# Patient Record
Sex: Female | Born: 1945 | Race: White | Hispanic: No | Marital: Married | Smoking: Former smoker
Health system: Southern US, Community
[De-identification: ages and names within clinical notes are randomized; demographics above are authoritative.]

## PROBLEM LIST (undated history)

## (undated) DIAGNOSIS — I4891 Unspecified atrial fibrillation: Secondary | ICD-10-CM

## (undated) DIAGNOSIS — I499 Cardiac arrhythmia, unspecified: Secondary | ICD-10-CM

## (undated) DIAGNOSIS — E559 Vitamin D deficiency, unspecified: Secondary | ICD-10-CM

## (undated) DIAGNOSIS — K08109 Complete loss of teeth, unspecified cause, unspecified class: Secondary | ICD-10-CM

## (undated) DIAGNOSIS — F419 Anxiety disorder, unspecified: Secondary | ICD-10-CM

## (undated) DIAGNOSIS — I480 Paroxysmal atrial fibrillation: Secondary | ICD-10-CM

## (undated) DIAGNOSIS — E1140 Type 2 diabetes mellitus with diabetic neuropathy, unspecified: Secondary | ICD-10-CM

## (undated) DIAGNOSIS — E119 Type 2 diabetes mellitus without complications: Secondary | ICD-10-CM

## (undated) DIAGNOSIS — N2581 Secondary hyperparathyroidism of renal origin: Secondary | ICD-10-CM

## (undated) DIAGNOSIS — Z7901 Long term (current) use of anticoagulants: Secondary | ICD-10-CM

## (undated) DIAGNOSIS — R112 Nausea with vomiting, unspecified: Secondary | ICD-10-CM

## (undated) DIAGNOSIS — G9332 Myalgic encephalomyelitis/chronic fatigue syndrome: Secondary | ICD-10-CM

## (undated) DIAGNOSIS — Z860100 Personal history of colon polyps, unspecified: Secondary | ICD-10-CM

## (undated) DIAGNOSIS — M810 Age-related osteoporosis without current pathological fracture: Secondary | ICD-10-CM

## (undated) DIAGNOSIS — R2681 Unsteadiness on feet: Secondary | ICD-10-CM

## (undated) DIAGNOSIS — K222 Esophageal obstruction: Secondary | ICD-10-CM

## (undated) DIAGNOSIS — M5030 Other cervical disc degeneration, unspecified cervical region: Secondary | ICD-10-CM

## (undated) DIAGNOSIS — G4733 Obstructive sleep apnea (adult) (pediatric): Secondary | ICD-10-CM

## (undated) DIAGNOSIS — L719 Rosacea, unspecified: Secondary | ICD-10-CM

## (undated) DIAGNOSIS — T4145XA Adverse effect of unspecified anesthetic, initial encounter: Secondary | ICD-10-CM

## (undated) DIAGNOSIS — M797 Fibromyalgia: Secondary | ICD-10-CM

## (undated) DIAGNOSIS — K219 Gastro-esophageal reflux disease without esophagitis: Secondary | ICD-10-CM

## (undated) DIAGNOSIS — E785 Hyperlipidemia, unspecified: Secondary | ICD-10-CM

## (undated) DIAGNOSIS — Z972 Presence of dental prosthetic device (complete) (partial): Secondary | ICD-10-CM

## (undated) DIAGNOSIS — F909 Attention-deficit hyperactivity disorder, unspecified type: Secondary | ICD-10-CM

## (undated) DIAGNOSIS — F32A Depression, unspecified: Secondary | ICD-10-CM

## (undated) DIAGNOSIS — N183 Chronic kidney disease, stage 3 unspecified: Secondary | ICD-10-CM

## (undated) DIAGNOSIS — I10 Essential (primary) hypertension: Secondary | ICD-10-CM

## (undated) DIAGNOSIS — K5909 Other constipation: Secondary | ICD-10-CM

## (undated) DIAGNOSIS — I341 Nonrheumatic mitral (valve) prolapse: Secondary | ICD-10-CM

## (undated) DIAGNOSIS — C4490 Unspecified malignant neoplasm of skin, unspecified: Secondary | ICD-10-CM

## (undated) DIAGNOSIS — H269 Unspecified cataract: Secondary | ICD-10-CM

## (undated) DIAGNOSIS — M1990 Unspecified osteoarthritis, unspecified site: Secondary | ICD-10-CM

## (undated) DIAGNOSIS — Z794 Long term (current) use of insulin: Secondary | ICD-10-CM

## (undated) DIAGNOSIS — Z9889 Other specified postprocedural states: Secondary | ICD-10-CM

## (undated) HISTORY — DX: Hyperlipidemia, unspecified: E78.5

## (undated) HISTORY — PX: CATARACT EXTRACTION W/ INTRAOCULAR LENS IMPLANT: SHX1309

## (undated) HISTORY — DX: Essential (primary) hypertension: I10

## (undated) HISTORY — DX: Unspecified cataract: H26.9

## (undated) HISTORY — PX: TUBAL LIGATION: SHX77

## (undated) HISTORY — PX: ABDOMINAL HYSTERECTOMY: SHX81

## (undated) HISTORY — PX: BREAST EXCISIONAL BIOPSY: SUR124

## (undated) HISTORY — DX: Nonrheumatic mitral (valve) prolapse: I34.1

## (undated) HISTORY — DX: Obstructive sleep apnea (adult) (pediatric): G47.33

---

## 1973-04-24 DIAGNOSIS — T4145XA Adverse effect of unspecified anesthetic, initial encounter: Secondary | ICD-10-CM

## 1973-04-24 DIAGNOSIS — T8859XA Other complications of anesthesia, initial encounter: Secondary | ICD-10-CM

## 1973-04-24 HISTORY — DX: Other complications of anesthesia, initial encounter: T88.59XA

## 1973-04-24 HISTORY — DX: Adverse effect of unspecified anesthetic, initial encounter: T41.45XA

## 1979-04-25 HISTORY — PX: THYROIDECTOMY: SHX17

## 1981-04-24 HISTORY — PX: TUBAL LIGATION: SHX77

## 1985-04-24 HISTORY — PX: THYROIDECTOMY: SHX17

## 1987-04-25 HISTORY — PX: CARDIAC CATHETERIZATION: SHX172

## 1993-04-24 HISTORY — PX: TOTAL ABDOMINAL HYSTERECTOMY W/ BILATERAL SALPINGOOPHORECTOMY: SHX83

## 1998-11-15 ENCOUNTER — Encounter: Payer: Self-pay | Admitting: Emergency Medicine

## 1998-11-15 ENCOUNTER — Encounter: Payer: Self-pay | Admitting: Internal Medicine

## 1998-11-15 ENCOUNTER — Observation Stay (HOSPITAL_COMMUNITY): Admission: EM | Admit: 1998-11-15 | Discharge: 1998-11-15 | Payer: Self-pay | Admitting: Emergency Medicine

## 1998-11-30 ENCOUNTER — Ambulatory Visit (HOSPITAL_COMMUNITY): Admission: RE | Admit: 1998-11-30 | Discharge: 1998-11-30 | Payer: Self-pay | Admitting: Gastroenterology

## 1999-03-12 ENCOUNTER — Ambulatory Visit (HOSPITAL_COMMUNITY): Admission: RE | Admit: 1999-03-12 | Discharge: 1999-03-12 | Payer: Self-pay | Admitting: *Deleted

## 1999-12-09 ENCOUNTER — Encounter: Admission: RE | Admit: 1999-12-09 | Discharge: 1999-12-09 | Payer: Self-pay | Admitting: Geriatric Medicine

## 1999-12-09 ENCOUNTER — Encounter: Payer: Self-pay | Admitting: Geriatric Medicine

## 2000-02-14 ENCOUNTER — Encounter: Admission: RE | Admit: 2000-02-14 | Discharge: 2000-02-14 | Payer: Self-pay | Admitting: Geriatric Medicine

## 2000-02-14 ENCOUNTER — Encounter: Payer: Self-pay | Admitting: Geriatric Medicine

## 2000-12-14 ENCOUNTER — Other Ambulatory Visit: Admission: RE | Admit: 2000-12-14 | Discharge: 2000-12-14 | Payer: Self-pay | Admitting: Geriatric Medicine

## 2000-12-18 ENCOUNTER — Encounter: Admission: RE | Admit: 2000-12-18 | Discharge: 2000-12-18 | Payer: Self-pay | Admitting: Geriatric Medicine

## 2000-12-18 ENCOUNTER — Encounter: Payer: Self-pay | Admitting: Geriatric Medicine

## 2001-03-03 ENCOUNTER — Ambulatory Visit (HOSPITAL_COMMUNITY): Admission: RE | Admit: 2001-03-03 | Discharge: 2001-03-03 | Payer: Self-pay | Admitting: Neurological Surgery

## 2001-03-03 ENCOUNTER — Encounter: Payer: Self-pay | Admitting: Neurological Surgery

## 2002-07-04 ENCOUNTER — Encounter: Payer: Self-pay | Admitting: Geriatric Medicine

## 2002-07-04 ENCOUNTER — Encounter: Admission: RE | Admit: 2002-07-04 | Discharge: 2002-07-04 | Payer: Self-pay | Admitting: Geriatric Medicine

## 2002-12-15 ENCOUNTER — Other Ambulatory Visit: Admission: RE | Admit: 2002-12-15 | Discharge: 2002-12-15 | Payer: Self-pay | Admitting: Obstetrics and Gynecology

## 2003-09-25 ENCOUNTER — Encounter: Admission: RE | Admit: 2003-09-25 | Discharge: 2003-09-25 | Payer: Self-pay | Admitting: Geriatric Medicine

## 2004-04-05 ENCOUNTER — Encounter: Admission: RE | Admit: 2004-04-05 | Discharge: 2004-04-05 | Payer: Self-pay | Admitting: Geriatric Medicine

## 2004-08-12 ENCOUNTER — Ambulatory Visit: Payer: Self-pay | Admitting: Pulmonary Disease

## 2004-12-09 ENCOUNTER — Encounter: Admission: RE | Admit: 2004-12-09 | Discharge: 2004-12-09 | Payer: Self-pay | Admitting: Geriatric Medicine

## 2005-03-01 ENCOUNTER — Other Ambulatory Visit: Admission: RE | Admit: 2005-03-01 | Discharge: 2005-03-01 | Payer: Self-pay | Admitting: Geriatric Medicine

## 2005-03-22 ENCOUNTER — Encounter: Admission: RE | Admit: 2005-03-22 | Discharge: 2005-03-22 | Payer: Self-pay | Admitting: Geriatric Medicine

## 2005-10-20 ENCOUNTER — Encounter: Admission: RE | Admit: 2005-10-20 | Discharge: 2005-10-20 | Payer: Self-pay | Admitting: Geriatric Medicine

## 2005-12-20 ENCOUNTER — Encounter: Admission: RE | Admit: 2005-12-20 | Discharge: 2005-12-20 | Payer: Self-pay | Admitting: Gastroenterology

## 2006-01-09 ENCOUNTER — Encounter: Admission: RE | Admit: 2006-01-09 | Discharge: 2006-01-09 | Payer: Self-pay | Admitting: Geriatric Medicine

## 2006-06-05 ENCOUNTER — Ambulatory Visit (HOSPITAL_BASED_OUTPATIENT_CLINIC_OR_DEPARTMENT_OTHER): Admission: RE | Admit: 2006-06-05 | Discharge: 2006-06-05 | Payer: Self-pay | Admitting: Pulmonary Disease

## 2006-06-05 ENCOUNTER — Encounter: Payer: Self-pay | Admitting: Pulmonary Disease

## 2006-06-23 ENCOUNTER — Ambulatory Visit: Payer: Self-pay | Admitting: Pulmonary Disease

## 2006-08-30 ENCOUNTER — Ambulatory Visit: Payer: Self-pay | Admitting: Pulmonary Disease

## 2006-10-17 ENCOUNTER — Encounter: Admission: RE | Admit: 2006-10-17 | Discharge: 2006-10-17 | Payer: Self-pay | Admitting: Geriatric Medicine

## 2006-11-19 ENCOUNTER — Ambulatory Visit: Payer: Self-pay | Admitting: Pulmonary Disease

## 2007-07-10 DIAGNOSIS — E782 Mixed hyperlipidemia: Secondary | ICD-10-CM | POA: Insufficient documentation

## 2007-07-10 DIAGNOSIS — I059 Rheumatic mitral valve disease, unspecified: Secondary | ICD-10-CM | POA: Insufficient documentation

## 2007-07-10 DIAGNOSIS — I10 Essential (primary) hypertension: Secondary | ICD-10-CM | POA: Insufficient documentation

## 2007-07-10 DIAGNOSIS — R5382 Chronic fatigue, unspecified: Secondary | ICD-10-CM | POA: Insufficient documentation

## 2007-07-10 DIAGNOSIS — G4733 Obstructive sleep apnea (adult) (pediatric): Secondary | ICD-10-CM | POA: Insufficient documentation

## 2008-06-22 ENCOUNTER — Encounter: Admission: RE | Admit: 2008-06-22 | Discharge: 2008-06-22 | Payer: Self-pay | Admitting: Geriatric Medicine

## 2008-06-22 ENCOUNTER — Ambulatory Visit: Payer: Self-pay | Admitting: Pulmonary Disease

## 2008-08-27 ENCOUNTER — Ambulatory Visit: Payer: Self-pay | Admitting: Pulmonary Disease

## 2008-10-15 ENCOUNTER — Encounter: Payer: Self-pay | Admitting: Pulmonary Disease

## 2009-07-07 ENCOUNTER — Encounter: Admission: RE | Admit: 2009-07-07 | Discharge: 2009-07-07 | Payer: Self-pay | Admitting: Orthopedic Surgery

## 2010-05-13 ENCOUNTER — Encounter
Admission: RE | Admit: 2010-05-13 | Discharge: 2010-05-13 | Payer: Self-pay | Source: Home / Self Care | Attending: Geriatric Medicine | Admitting: Geriatric Medicine

## 2010-05-14 ENCOUNTER — Encounter: Payer: Self-pay | Admitting: Geriatric Medicine

## 2010-05-15 ENCOUNTER — Encounter: Payer: Self-pay | Admitting: Geriatric Medicine

## 2010-05-15 ENCOUNTER — Encounter: Payer: Self-pay | Admitting: Orthopedic Surgery

## 2010-05-16 ENCOUNTER — Encounter: Payer: Self-pay | Admitting: Geriatric Medicine

## 2010-05-26 NOTE — Miscellaneous (Signed)
Summary: optimal pressure is 11cm  Clinical Lists Changes  Orders: Added new Referral order of DME Referral (DME) - Signed auto shows good compliance, optimal pressure 11cm

## 2010-05-26 NOTE — Procedures (Signed)
Summary: [facility]   Imported By: Sherian Rein 06/30/2008 09:13:10  _____________________________________________________________________  External Attachment:    Type:   Image     Comment:   External Document

## 2010-05-26 NOTE — Assessment & Plan Note (Signed)
Summary: rov for osa   PCP:  [facility]  Chief Complaint:  Pt is here for a f/u appt. Pt would like to discuss starting cpap machine.  Pt was last seen July 2008. [name]  History of Present Illness: the pt comes in today for evaluation of osa.  She was last seen in 2008, where she was diagnosed with moderate osa.  Despite my recommendation to start cpap, the pt decided that she didn't want to proceed with treatment.  She was lost to follow up.  She comes in today where she is continuing to have nonrestorative sleep, and significant daytime sleepiness. She is still taking many sedating meds, as well as stimulant medication during the day.    Prior Medications Reviewed Using: Patient Recall  Updated Prior Medication List: NEXIUM 40 MG  CPDR (ESOMEPRAZOLE MAGNESIUM) once daily LIPITOR 80 MG  TABS (ATORVASTATIN CALCIUM) once daily CYMBALTA 60 MG  CPEP (DULOXETINE HCL) every morning CYMBALTA 30 MG  CPEP (DULOXETINE HCL) at bedtime METFORMIN HCL 500 MG  TABS (METFORMIN HCL) 2 two times a day ATENOLOL 25 MG TABS (ATENOLOL) Take 1 tablet by mouth once a day PREMARIN 0.9 MG  TABS (ESTROGENS CONJUGATED) at bedtime NITROQUICK 0.4 MG  SUBL (NITROGLYCERIN) as needed OXYCONTIN 40 MG  TB12 (OXYCODONE HCL) take 1 tab by mouth every 12 hours for pain ADDERALL 20 MG  TABS (AMPHETAMINE-DEXTROAMPHETAMINE) 2 two times a day LORAZEPAM 1 MG  TABS (LORAZEPAM) as needed AMARYL 4 MG TABS (GLIMEPIRIDE) Take 1 tablet by mouth two times a day LYRICA 150 MG  CAPS (PREGABALIN) take 3 tabs by mouth at bedtime FLEXERIL 10 MG  TABS (CYCLOBENZAPRINE HCL) as needed OXYCONTIN 10 MG XR12H-TAB (OXYCODONE HCL) take 1/2 tab by mouth as needed for breakthrough pain  Current Allergies: ! PCN  Review of Systems       The patient complains of difficulty falling asleep, difficulty staying asleep, waking up choking, frequent night time awakenings, prolonged middle of the night awakening, excessive daytime sleepiness, loud snoring,  witnessed apneas, difficulty waking, and non restorative sleep.    Vital Signs:  Patient Profile:   65 Years Old Female Weight:      231.25 pounds O2 Sat:      94 % O2 treatment:    Room Air Temp:     98.1 degrees F oral Pulse rate:   111 / minute BP sitting:   120 / 78  (right arm) Cuff size:   large  Vitals Entered By: Arman Filter LPN (June 22, 4096 3:10 PM)             Comments Medications reviewed with patient Arman Filter LPN  June 22, 1189 3:10 PM     Physical Exam  General:     obese female in nad   Impression & Recommendations:  Problem # 1:  OBSTRUCTIVE SLEEP APNEA (ICD-327.23) the pt has known moderate osa, and is continuing to have significant symptoms.  She is willing at this point to try cpap therapy.  I also encouraged her to work on weight loss.  I have also mentioned that her daytime sleepiness may continue to be an issue as long as she is on multiple sedating meds.  Medications Added to Medication List This Visit: 1)  Atenolol 25 Mg Tabs (Atenolol) .... Take 1 tablet by mouth once a day 2)  Oxycontin 40 Mg Tb12 (Oxycodone hcl) .... Take 1 tab by mouth every 12 hours for pain 3)  Amaryl 4 Mg Tabs (  Glimepiride) .... Take 1 tablet by mouth two times a day 4)  Lyrica 150 Mg Caps (Pregabalin) .... Take 3 tabs by mouth at bedtime 5)  Oxycontin 10 Mg Xr12h-tab (Oxycodone hcl) .... Take 1/2 tab by mouth as needed for breakthrough pain  Patient Instructions: 1)  will start cpap  2)  work on weight loss 3)  f/u with me in 4 wks.

## 2010-05-26 NOTE — Assessment & Plan Note (Signed)
Summary: rov for osa/cpap trial   Primary Provider/Referring Provider:  [facility]  CC:  Pt is here for a f/u appt since starting CPAP.  Pt states she is using cpap machine every night. Approx 4 1/2 hrs per night.  Pt c/o condensation building up in the tubes, head and nasal congestion making it difficult to wear mask, and and difficulty with mask fitting properly.  Pt also wonders if pressure needs to be adjusted. [name]  History of Present Illness: The pt comes in today for f/u of her osa.  She has been wearing cpap for the past 3-4 weeks, and thinks the pressure is too low.  She also has been having issues getting the humidity adjusted, but thinks it is slowly gettting  better.  Her mask fits fairly well, with no significant leaking.  On the nights that she has worn it all night, she thinks her sleep was improved.  Current Medications (verified): 1)  Nexium 40 Mg  Cpdr (Esomeprazole Magnesium) .... Once Daily 2)  Lipitor 80 Mg  Tabs (Atorvastatin Calcium) .... Once Daily 3)  Cymbalta 60 Mg  Cpep (Duloxetine Hcl) .... Every Morning 4)  Cymbalta 30 Mg  Cpep (Duloxetine Hcl) .... At Bedtime 5)  Metformin Hcl 500 Mg  Tabs (Metformin Hcl) .... 2 Two Times A Day 6)  Premarin 0.9 Mg  Tabs (Estrogens Conjugated) .... At Bedtime 7)  Nitroquick 0.4 Mg  Subl (Nitroglycerin) .... As Needed 8)  Oxycontin 40 Mg  Tb12 (Oxycodone Hcl) .... Take 1 Tab By Mouth Every 12 Hours For Pain 9)  Adderall 20 Mg  Tabs (Amphetamine-Dextroamphetamine) .... 2 Two Times A Day 10)  Lorazepam 1 Mg  Tabs (Lorazepam) .... As Needed 11)  Amaryl 4 Mg Tabs (Glimepiride) .... Take 1 Tablet By Mouth Two Times A Day 12)  Lyrica 150 Mg  Caps (Pregabalin) .... Take 3 Tabs By Mouth At Bedtime 13)  Flexeril 10 Mg  Tabs (Cyclobenzaprine Hcl) .... As Needed 14)  Oxycontin 10 Mg Xr12h-Tab (Oxycodone Hcl) .... Take 1/2 Tab By Mouth As Needed For Breakthrough Pain 15)  Levaquin 500 Mg Tabs (Levofloxacin) .... Take 1 Tablet By Mouth Once A Day  X 5 Days  Allergies (verified): 1)  ! Pcn  Review of Systems      See HPI  Vital Signs:  Patient profile:   65 year old female Weight:      233.38 pounds O2 Sat:      94 % Temp:     97.7 degrees F oral Pulse rate:   120 / minute BP sitting:   112 / 76  (right arm) Cuff size:   large  Vitals Entered By: Arman Filter LPN (Aug 27, 1608 1:47 PM)  O2 Sat on room air at rest %:  94 CC: Pt is here for a f/u appt since starting CPAP.  Pt states she is using cpap machine every night. Approx 4 1/2 hrs per night.  Pt c/o condensation building up in the tubes, head and nasal congestion making it difficult to wear mask, and difficulty with mask fitting properly.  Pt also wonders if pressure needs to be adjusted.  Comments Medications reviewed with patient Arman Filter LPN  Aug 28, 9602 1:47 PM    Physical Exam  General:  ow female in nad Nose:  no skin breakdown or pressure necrosis from cpap mask.   Impression & Recommendations:  Problem # 1:  OBSTRUCTIVE SLEEP APNEA (ICD-327.23) it seems the patient has gotten  adjusted to CPAP, but more than likely her current pressure is suboptimal. At this point, I think we need to optimize her pressure for her with an autotitrating mode, and then see if she does better.  I will call her when the results are available.  I have also encouraged her to work hard on weight loss.  Medications Added to Medication List This Visit: 1)  Levaquin 500 Mg Tabs (Levofloxacin) .... Take 1 tablet by mouth once a day x 5 days  Other Orders: Est. Patient Level III (16109) DME Referral (DME)  Patient Instructions: 1)  will optimize your pressure for you with the auto setting.  Will let you know results and set up follow up apptm after that.  2)  continue to work on weight loss.

## 2010-05-31 ENCOUNTER — Ambulatory Visit: Payer: Self-pay | Admitting: Pulmonary Disease

## 2010-06-14 ENCOUNTER — Ambulatory Visit: Payer: Self-pay | Admitting: Pulmonary Disease

## 2010-08-12 ENCOUNTER — Other Ambulatory Visit: Payer: Self-pay | Admitting: Geriatric Medicine

## 2010-08-23 ENCOUNTER — Other Ambulatory Visit: Payer: Self-pay

## 2010-08-30 ENCOUNTER — Other Ambulatory Visit: Payer: Self-pay | Admitting: *Deleted

## 2010-08-30 ENCOUNTER — Other Ambulatory Visit (HOSPITAL_COMMUNITY)
Admission: RE | Admit: 2010-08-30 | Discharge: 2010-08-30 | Disposition: A | Discharge status: Home / Self Care | Payer: Medicare Other | Source: Ambulatory Visit | Attending: Geriatric Medicine | Admitting: Geriatric Medicine

## 2010-08-30 DIAGNOSIS — Z124 Encounter for screening for malignant neoplasm of cervix: Secondary | ICD-10-CM | POA: Insufficient documentation

## 2010-09-06 NOTE — Assessment & Plan Note (Signed)
[facility]                             PULMONARY OFFICE NOTE   [name], [name]                      MRN:          [id number]  DATE:08/30/2006                            DOB:          02/01/1946    HISTORY OF PRESENT ILLNESS:  The patient is a 65 year old white female  who comes in today for evaluation of obstructive sleep apnea.  I  initially saw the patient back in 2006 and scheduled her for nocturnal  polysomnography.  The patient cancelled this study, never rescheduled,  and subsequently never followed up with me.  I received a phone call  from her on April 30, 2006, where she wanted to go ahead and reschedule  the sleep study, since she continued to have difficulty with her sleep.  She could not make the study until February 2008 and now comes in for  followup.  The patient was found to have moderate obstructive sleep  apnea with 23 obstructive events noted in the first 68 minutes of sleep.  Unfortunately, this was her total sleep time.  She never achieved REM or  slow-wave sleep because of very prolonged sleep onset.  It should be  noted that she did take OxyContin as well as Adderall at bedtime.  The  patient was also found to have O2 desaturations as well in the high 70s  at times.  The patient has been told that she has snoring, but no one  has ever mentioned pauses in her breathing during sleep.  Her husband  wears CPAP himself.  She typically gets to bed between 12:00 and 1:00 at  night and gets up between 9:00 and 10:00 to start her day.  She is not  rested upon arising.  The patient states that multiple times during the  day she is overcome by a need to lie down and take a nap.  She notes  significant sleepiness with reading or trying to watch TV.   PAST MEDICAL HISTORY:  1. Hypertension.  2. History of mitral valve prolapse.  3. Diabetes.  4. Dyslipidemia.  5. Status post hysterectomy and tubal ligation.  6. History of chronic  fatigue syndrome and fibromyalgia.   MEDICATIONS:  1. Verelan 200 mg nightly.  2. Nexium 40 mg b.i.d.  3. Lipitor 80 mg daily.  4. Neurontin 300 mg one in the p.m. and two at h.s.  5. Cymbalta 60 mg q.a.m. and 30 mg nightly.  6. Metformin 500 mg two b.i.d.  7. Imdur 60 mg one-half daily.  8. Nitroglycerin p.r.n.  9. Atenolol 50 mg nightly.  10.Premarin 0.9 mg nightly.  11.Oxycodone CR 40 mg one q.8 h.  12.Adderall 20 mg two b.i.d.  13.Lorazepam 1 mg p.r.n.  14.Amaryl 2 mg daily.  15.Flexeril 10 mg p.r.n.   ALLERGIES:  The patient has an allergy to PENICILLIN.   SOCIAL HISTORY:  She is married.  She has never smoked.   FAMILY HISTORY:  Remarkable for father having heart disease as well as  cancer, otherwise is noncontributory in first-degree relatives.   REVIEW OF SYSTEMS:  As per history of  present illness; also see patient  intake form documented in the chart.   PHYSICAL EXAMINATION:  GENERAL:  She is an obese white female in no  acute distress.  Blood pressure is 114/72, pulse 81 and temperature is 98.2.  Weight is  235 pounds.  O2 saturation on room air is 95%.  HEENT:  Pupils are equal, round and reactive to light and accommodation.  Extraocular muscles are intact.  Nares show mild septal deviation to the  left with narrowing.  Oropharynx shows significant narrowing posteriorly  with elongation of soft palate, but a normal uvula.  NECK:  Supple without JVD or lymphadenopathy.  There is no palpable  thyromegaly.  CHEST:  Totally clear.  CARDIAC:  Regular rate and rhythm.  No murmurs, rubs or gallops.  ABDOMEN:  Soft and nontender with good bowel sounds.  GENITAL:  Not done and not indicated.  RECTAL:  Not done and not indicated.  BREASTS:  Not done and not indicated.  LOWER EXTREMITIES:  Show trace edema.  Pulses are intact distally.  NEUROLOGICAL:  Alert and oriented with no obvious motor deficits.   IMPRESSION:  1. Moderate obstructive sleep apnea with  significant oxygen      desaturation by nocturnal polysomnography.  The patient is      significantly obese and has abnormal upper airway anatomy.  I      really think a trial of continuous positive airway pressure and      weight loss are her best options.  I told the patient that her      sleep study represented the very best case scenario, since she only      had 68 minutes of total sleep time and had significant events      during that time.  If she had slept longer and gotten into slow-      wave sleep or rapid eye movement, I suspect her study would have      been much worse.  2. Sleep onset issues that seem to be variable by the patient's      history.  I have made a note that she does take Adderall in the      evening and this may contribute to this.  I am also concerned how      her medications may affect her daytime sleepiness.  She and I have      had a conversation about this.   PLAN:  1. Work on weight loss.  2. Initiate CPAP at 10 cm.  3. I have asked the patient to consider not taking Adderall in the      evenings.  4. The patient will follow up in 4 weeks or sooner if there are      problems.     Barbaraann Share, MD,FCCP  Electronically Signed    [facility]  DD: 08/30/2006  DT: 08/31/2006  Job #: [id number]   cc:   Hal T. Stoneking, M.D.

## 2010-09-09 NOTE — Procedures (Signed)
[name], THEISSEN NO.:  [id number]   MEDICAL RECORD NO.:  [id number]          PATIENT TYPE:  OUT   LOCATION:  SLEEP CENTER                 FACILITY:  [facility]   PHYSICIAN:  Barbaraann Share, MD,FCCPDATE OF BIRTH:  04-13-48   DATE OF STUDY:  06/05/2006                            NOCTURNAL POLYSOMNOGRAM   INDICATION FOR STUDY:  Hypersomnia with sleep apnea, also persistent  disorder of initiating and maintaining sleep.   EPWORTH SLEEPINESS SCORE:  7.   MEDICATIONS:   SLEEP ARCHITECTURE:  The patient had a total sleep time of only 68  minutes but never achieved REM or slow wave sleep.  Sleep onset latency  was 285 minutes.  Sleep efficiency was only 19%.  It should be noted  that patient had very restless sleep and that she did take OxyContin and  also Adderall right at bedtime.  She did not take Ativan.   RESPIRATORY DATA:  The patient was found to have 16 hypopneas and 7  apneas during her total sleep time of 68 minutes.  This gave her an  apnea-hypopnea index of 20 events per hour and there was mild snoring  noted throughout.  The events were not positional in nature.   OXYGEN DATA:  There was O2 desaturation that appeared to be in the high  70's at times and independent of patient's obstructive events.  However,  there were difficulties throughout the night with the pulse ox probe and  also wiring.   CARDIAC DATA:  There were no clinically significant cardiac arrhythmias.   MOVEMENT-PARASOMNIA:  None.   IMPRESSIONS-RECOMMENDATIONS:  1. Moderate obstructive sleep apnea/hypopnea syndrome with an      apnea/hypopnea index of 20 events per hour noted in the patient's      short total sleep time.  Treatment for this degree of sleep apnea      can include weight loss alone if applicable, airway surgery, oral      appliance, and also CPAP.  2. Significant oxygen desaturation into the high 70's, independent of      the patient's obstructive events.  However,  there were difficulties      during the night with the pulse oximetry probe and wire, and      therefore it is unclear whether this is an accurate assessment.  I      would recommend at this time overnight oximetry as an outpatient.  3. Significant sleep disruption and restlessness with prolonged sleep      onset latency.  It should be noted that the patient took both      OxyContin and Adderall prior to going to bed.  It is obvious that      she has a sleep onset      problem, and that it is unclear how much insomnia verses      medications may be playing a role here.  Clinical correlation is      suggested.      Barbaraann Share, MD,FCCP  Diplomate, American Board of Sleep  Medicine  Electronically Signed     [facility]  D:  06/24/2006 10:29:48  T:  06/24/2006 14:14:36  Job:  [id number]

## 2010-12-07 ENCOUNTER — Ambulatory Visit: Payer: Federal, State, Local not specified - PPO | Admitting: Pulmonary Disease

## 2011-01-06 ENCOUNTER — Encounter: Payer: Self-pay | Admitting: Pulmonary Disease

## 2011-01-09 ENCOUNTER — Ambulatory Visit: Payer: Federal, State, Local not specified - PPO | Admitting: Pulmonary Disease

## 2011-01-11 ENCOUNTER — Ambulatory Visit: Payer: Federal, State, Local not specified - PPO | Admitting: Pulmonary Disease

## 2011-01-20 ENCOUNTER — Ambulatory Visit: Payer: Medicare Other | Admitting: Pulmonary Disease

## 2011-05-10 ENCOUNTER — Other Ambulatory Visit: Payer: Self-pay | Admitting: Geriatric Medicine

## 2011-05-10 DIAGNOSIS — Z1231 Encounter for screening mammogram for malignant neoplasm of breast: Secondary | ICD-10-CM

## 2011-05-30 ENCOUNTER — Ambulatory Visit
Admission: RE | Admit: 2011-05-30 | Discharge: 2011-05-30 | Disposition: A | Discharge status: Home / Self Care | Payer: Medicare Other | Source: Ambulatory Visit | Attending: Geriatric Medicine | Admitting: Geriatric Medicine

## 2011-05-30 ENCOUNTER — Ambulatory Visit: Payer: Medicare Other

## 2011-05-30 DIAGNOSIS — Z1231 Encounter for screening mammogram for malignant neoplasm of breast: Secondary | ICD-10-CM | POA: Diagnosis not present

## 2011-05-30 DIAGNOSIS — IMO0001 Reserved for inherently not codable concepts without codable children: Secondary | ICD-10-CM | POA: Diagnosis not present

## 2011-05-30 DIAGNOSIS — Z79899 Other long term (current) drug therapy: Secondary | ICD-10-CM | POA: Diagnosis not present

## 2011-05-30 DIAGNOSIS — I10 Essential (primary) hypertension: Secondary | ICD-10-CM | POA: Diagnosis not present

## 2011-05-30 DIAGNOSIS — E7800 Pure hypercholesterolemia, unspecified: Secondary | ICD-10-CM | POA: Diagnosis not present

## 2011-05-30 DIAGNOSIS — R000 Tachycardia, unspecified: Secondary | ICD-10-CM | POA: Diagnosis not present

## 2011-05-30 DIAGNOSIS — R5381 Other malaise: Secondary | ICD-10-CM | POA: Diagnosis not present

## 2011-05-30 DIAGNOSIS — F329 Major depressive disorder, single episode, unspecified: Secondary | ICD-10-CM | POA: Diagnosis not present

## 2011-06-09 DIAGNOSIS — E669 Obesity, unspecified: Secondary | ICD-10-CM | POA: Diagnosis not present

## 2011-06-09 DIAGNOSIS — D72829 Elevated white blood cell count, unspecified: Secondary | ICD-10-CM | POA: Diagnosis not present

## 2011-06-09 DIAGNOSIS — N289 Disorder of kidney and ureter, unspecified: Secondary | ICD-10-CM | POA: Diagnosis not present

## 2011-06-09 DIAGNOSIS — R000 Tachycardia, unspecified: Secondary | ICD-10-CM | POA: Diagnosis not present

## 2011-06-09 DIAGNOSIS — Z79899 Other long term (current) drug therapy: Secondary | ICD-10-CM | POA: Diagnosis not present

## 2011-06-09 DIAGNOSIS — R81 Glycosuria: Secondary | ICD-10-CM | POA: Diagnosis not present

## 2011-06-09 DIAGNOSIS — I4892 Unspecified atrial flutter: Secondary | ICD-10-CM | POA: Diagnosis not present

## 2011-06-09 DIAGNOSIS — IMO0001 Reserved for inherently not codable concepts without codable children: Secondary | ICD-10-CM | POA: Diagnosis not present

## 2011-06-15 DIAGNOSIS — I4891 Unspecified atrial fibrillation: Secondary | ICD-10-CM | POA: Diagnosis not present

## 2011-06-15 DIAGNOSIS — I4892 Unspecified atrial flutter: Secondary | ICD-10-CM | POA: Diagnosis not present

## 2011-06-15 DIAGNOSIS — E7800 Pure hypercholesterolemia, unspecified: Secondary | ICD-10-CM | POA: Diagnosis not present

## 2011-06-15 DIAGNOSIS — IMO0001 Reserved for inherently not codable concepts without codable children: Secondary | ICD-10-CM | POA: Diagnosis not present

## 2011-06-15 DIAGNOSIS — I10 Essential (primary) hypertension: Secondary | ICD-10-CM | POA: Diagnosis not present

## 2011-06-19 DIAGNOSIS — I4892 Unspecified atrial flutter: Secondary | ICD-10-CM | POA: Diagnosis not present

## 2011-06-19 DIAGNOSIS — IMO0001 Reserved for inherently not codable concepts without codable children: Secondary | ICD-10-CM | POA: Diagnosis not present

## 2011-06-19 DIAGNOSIS — I10 Essential (primary) hypertension: Secondary | ICD-10-CM | POA: Diagnosis not present

## 2011-06-19 DIAGNOSIS — E7800 Pure hypercholesterolemia, unspecified: Secondary | ICD-10-CM | POA: Diagnosis not present

## 2011-06-19 DIAGNOSIS — I4891 Unspecified atrial fibrillation: Secondary | ICD-10-CM | POA: Diagnosis not present

## 2011-06-21 DIAGNOSIS — R072 Precordial pain: Secondary | ICD-10-CM | POA: Diagnosis not present

## 2011-06-21 DIAGNOSIS — Z79899 Other long term (current) drug therapy: Secondary | ICD-10-CM | POA: Diagnosis not present

## 2011-06-21 DIAGNOSIS — E7800 Pure hypercholesterolemia, unspecified: Secondary | ICD-10-CM | POA: Diagnosis not present

## 2011-06-21 DIAGNOSIS — Z7901 Long term (current) use of anticoagulants: Secondary | ICD-10-CM | POA: Diagnosis not present

## 2011-06-21 DIAGNOSIS — I10 Essential (primary) hypertension: Secondary | ICD-10-CM | POA: Diagnosis not present

## 2011-06-21 DIAGNOSIS — I4892 Unspecified atrial flutter: Secondary | ICD-10-CM | POA: Diagnosis not present

## 2011-06-27 ENCOUNTER — Ambulatory Visit: Payer: Medicare Other | Admitting: Pulmonary Disease

## 2011-06-27 DIAGNOSIS — I4891 Unspecified atrial fibrillation: Secondary | ICD-10-CM | POA: Diagnosis not present

## 2011-06-27 DIAGNOSIS — R51 Headache: Secondary | ICD-10-CM | POA: Diagnosis not present

## 2011-06-27 DIAGNOSIS — I129 Hypertensive chronic kidney disease with stage 1 through stage 4 chronic kidney disease, or unspecified chronic kidney disease: Secondary | ICD-10-CM | POA: Diagnosis not present

## 2011-06-27 DIAGNOSIS — R42 Dizziness and giddiness: Secondary | ICD-10-CM | POA: Diagnosis not present

## 2011-06-27 DIAGNOSIS — N183 Chronic kidney disease, stage 3 unspecified: Secondary | ICD-10-CM | POA: Diagnosis not present

## 2011-07-04 DIAGNOSIS — I10 Essential (primary) hypertension: Secondary | ICD-10-CM | POA: Diagnosis not present

## 2011-07-04 DIAGNOSIS — I4891 Unspecified atrial fibrillation: Secondary | ICD-10-CM | POA: Diagnosis not present

## 2011-07-13 DIAGNOSIS — I4892 Unspecified atrial flutter: Secondary | ICD-10-CM | POA: Diagnosis not present

## 2011-07-13 DIAGNOSIS — IMO0001 Reserved for inherently not codable concepts without codable children: Secondary | ICD-10-CM | POA: Diagnosis not present

## 2011-07-13 DIAGNOSIS — N289 Disorder of kidney and ureter, unspecified: Secondary | ICD-10-CM | POA: Diagnosis not present

## 2011-07-13 DIAGNOSIS — E669 Obesity, unspecified: Secondary | ICD-10-CM | POA: Diagnosis not present

## 2011-07-13 DIAGNOSIS — I959 Hypotension, unspecified: Secondary | ICD-10-CM | POA: Diagnosis not present

## 2011-07-13 DIAGNOSIS — I4891 Unspecified atrial fibrillation: Secondary | ICD-10-CM | POA: Diagnosis not present

## 2011-07-18 ENCOUNTER — Encounter (HOSPITAL_COMMUNITY): Payer: Self-pay | Admitting: Pharmacy Technician

## 2011-07-19 DIAGNOSIS — Z124 Encounter for screening for malignant neoplasm of cervix: Secondary | ICD-10-CM | POA: Diagnosis not present

## 2011-07-24 ENCOUNTER — Other Ambulatory Visit: Payer: Self-pay | Admitting: Cardiology

## 2011-07-24 NOTE — H&P (Signed)
[name] of 66 yo female followed by Dr Katrinka Blazing with a hx of recent onset of Atrial flutter on Xarelto and Amiodarone. She was switched from Pradaxa to Xarelto 15 mg po qd on 06/28/11. Her Xarelto was increased to 20 mg po qd on 07/06/11. She is also on Lasix 20 mg po qd and Lopressor 25 mg BID. Echocardiogram with EF 40-45%. 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 She feels lightheaded and fatigued when her BP is low. Later in the day her BP is normal. Her heart rate has been stable. She denies any syncope, nor PND, no swelling nor GI complaints. Headache resolved off the Pradaxa. She has mild occasional chest tightness that has improved but never resolved. .       ROS:  as noted in HPI, no swelling + chronic fatigue and fibromyalgia, appetite stable no fever, chills nor congestion.       Medical History: type 2 diabetes mellitus- Dr Sharl Ma, Fibromyalgia, Chronic fatigue syndrome, Endometriosis, Hypertension, had a hernia with GERD, ADD, osteoarthritis cervical spine MRI disc bulge right C5-C6 no nerve root impingement, GYN [facility] , opthal Dr Leron Croak, ortho Dr Amanda Pea, derm [facility] in [place], dentist Dr Sandrea Matte, chronic fatigue Dr Silvana Newness, Esophogeal spasm uses nitro, Sleep apnea cpap, Afib on 05/2011, cards Dr Katrinka Blazing.        Surgical History: benign breast biopsy , bilateral tubal ligation 1983, hysterectomy 1978, cardiac catheter 1989, thyroidectomy, right 1987.        Hospitalization/Major Diagnostic Procedure: Chest Pain and ;arrhythmia .        Family History: Father: alive 29 yrs hypertension, atrial fibrillation, DJD, melenoma ,heart disease, DM Mother: alive 29 yrs depression, , hypercholesterolemia Paternal Grand Father: deceased Diabetes Mellitus, MI Paternal Grand Mother: deceased CVA Maternal Grand Father: deceased Unknown Maternal Grand Mother: deceased Diabetes Mellitus, Kidney  failure Sister 1: alive 56 yrs spot on pancreas, kidney cancer Maternal uncle: deceased several uncles with Diabetes Mellitus Daughter(s): alive 79 yrs Son(s): alive 64 yrs        Social History:  General: History of smoking cigarettes: Former smoker, Quit in year 1972, Pack-year Hx: 1.5. no Smoking, quit, in the past. no Alcohol. 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 Marital Status: married. Children: 1 son, 1 daughter.  2 children.       Medications: Lyrica 150 MG Capsule 2 caps qhs nightly, MiraLax Powder 17 g once a day, Lidoderm 5 % Patch 1 patch to intact skin remove after 12 hours Once a day, Oxycodone HCl 5 MG Tablet 1 tablet every 6 hrs prn, Insulin Pen Needle for use with Byetta bid, FreeStyle Lancets , Vitamin B12 2500 mcg Tablet sublingual Once a day, Vitamin D 4000 IU Tablet 1 tablet Once a day, Nexium 40 mg Capsule Delayed Release one tablet b.i.d., Ativan 1 mg Tablet 1 tablet tid prn, OxyContin 40 MG 40 MG Tablet Extended Release 12 Hour 1 tablet t.i.d., Methyltest-Est Estrogens HS 1.25-0.625 MG Tablet 1 tablet with food daily, Adderall 20 MG Tablet 2 tablets twice daily, BuPROPion HCl 300 MG Tablet Extended Release 24 Hour 1 tablet every morning Once a day, Skelaxin 800 MG Tablet 1 tablet tid as needed, Lipitor 80 MG Tablet 1 tablet once a day---followed by Dr Pete Glatter, Ketoconazole 2 % Cream 1 application to affected area Once a day, Flonase 50 MCG/ACT Suspension  2 sprays Once a day, Byetta 10 MCG Pen 10 MCG/0.04ML Solution 0.04 ml up to 1 hour before breakfast and evening meal Twice a day, Lantus SoloStar 100 UNIT/ML Solution 52 untis Once a day each morning, FreeStyle Lite Test . . strips Use 3-4 test strips per day, Furosemide 20 MG Tablet 1 tablet Once a day, Xarelto 20 mg . tablet 1 tablet once a day, Metoprolol Tartrate 25 MG Tablet 1 tablet Twice a day, Amiodarone HCl 200 MG Tablet 1 tablet twice a day, Medication List reviewed and reconciled with  the patient       Allergies: myelogram dye: angioedema, Penicillin G Benzathine: angioedema, Avandia: Weight gain and edema: Side Effects, Pradaxa: headache.       Objective:     Vitals: Wt 212.4, Wt change .8 lb, Ht 64, BMI 36.45, Pulse sitting 76, BP sitting 122/86.       Examination:  Cardiology Exam:  GENERAL APPEARANCE: pleasant, NAD, comfortable, female, obese, comfortable. HEENT: normal. CAROTID UPSTROKE: no bruit, upstrokes intact. JVD: flat. HEART: irregular rate and rhythm, normal S1S2, no rub, no gallop, or click. LUNGS: clear to auscultation, no wheezing/rhonchi/rales. ABDOMEN: soft, non-tender, + bowel sounds. EXTREMITIES: no leg edema. PERIPHERAL PULSES: 2+, bilateral. NEUROLOGIC: grossly intact, cranial nerves intact, gait WNL. MOOD: normal.        Assessment:     Assessment:  1. Atrial flutter - 427.32 (Primary)  2. Hypotension - 458.9, noted at home and not in office    Plan:     1. Atrial flutter Continue Xarelto 20 mg tablet, ., 1 tablet, orally, once a day ; Continue Metoprolol Tartrate Tablet, 25 MG, 1 tablet, Orally, Twice a day ; Continue Amiodarone HCl Tablet, 200 MG, 1 tablet, Orally, twice a day .  Update given to Dr Katrinka Blazing will proceed with cardioversion after at least 2 more weeks on current therapy. Reviewed Cardioversion procedure including potential risk including skin irritation, strokes, arrthymias, and reaction to sedatives,.       2. Hypotension Change in dose Furosemide Tablet, 20 MG, 1 tablet, Orally, only prn if swelling or dyspnea .        Immunizations:        Labs:        Preventive:         Follow Up: HS pending procedure (Reason: Atrial flutter s/p DCCV)

## 2011-07-25 ENCOUNTER — Other Ambulatory Visit: Payer: Self-pay | Admitting: Interventional Cardiology

## 2011-07-25 DIAGNOSIS — I10 Essential (primary) hypertension: Secondary | ICD-10-CM | POA: Diagnosis not present

## 2011-07-25 DIAGNOSIS — I4891 Unspecified atrial fibrillation: Secondary | ICD-10-CM | POA: Diagnosis not present

## 2011-07-28 ENCOUNTER — Encounter (HOSPITAL_COMMUNITY): Payer: Self-pay | Admitting: Critical Care Medicine

## 2011-07-28 ENCOUNTER — Other Ambulatory Visit: Payer: Self-pay

## 2011-07-28 ENCOUNTER — Ambulatory Visit (HOSPITAL_COMMUNITY)
Admission: RE | Admit: 2011-07-28 | Discharge: 2011-07-28 | Disposition: A | Discharge status: Home / Self Care | Payer: Medicare Other | Source: Ambulatory Visit | Attending: Interventional Cardiology | Admitting: Interventional Cardiology

## 2011-07-28 ENCOUNTER — Encounter (HOSPITAL_COMMUNITY)
Admission: RE | Disposition: A | Discharge status: Home / Self Care | Payer: Self-pay | Source: Ambulatory Visit | Attending: Interventional Cardiology

## 2011-07-28 ENCOUNTER — Ambulatory Visit (HOSPITAL_COMMUNITY): Payer: Medicare Other | Admitting: Critical Care Medicine

## 2011-07-28 DIAGNOSIS — I4892 Unspecified atrial flutter: Secondary | ICD-10-CM | POA: Diagnosis not present

## 2011-07-28 DIAGNOSIS — I4891 Unspecified atrial fibrillation: Secondary | ICD-10-CM | POA: Diagnosis not present

## 2011-07-28 HISTORY — PX: CARDIOVERSION: SHX1299

## 2011-07-28 LAB — GLUCOSE, CAPILLARY: Glucose-Capillary: 111 mg/dL — ABNORMAL HIGH (ref 70–99)

## 2011-07-28 SURGERY — CARDIOVERSION
Anesthesia: General | Wound class: Clean

## 2011-07-28 MED ORDER — HYDROCORTISONE 1 % EX CREA: 1.0000 "application " | TOPICAL_CREAM | Freq: Two times a day (BID) | CUTANEOUS | Status: AC

## 2011-07-28 MED ORDER — PROPOFOL 10 MG/ML IV BOLUS
INTRAVENOUS | Status: DC | PRN
  Administered 2011-07-28: 40 mg via INTRAVENOUS

## 2011-07-28 MED ORDER — SODIUM CHLORIDE 0.9 % IV SOLN
250.0000 mL | INTRAVENOUS | Status: DC
  Administered 2011-07-28 (×2): via INTRAVENOUS

## 2011-07-28 MED ORDER — HYDROCORTISONE 1 % EX CREA: 1.0000 "application " | TOPICAL_CREAM | Freq: Three times a day (TID) | CUTANEOUS | Status: DC | PRN

## 2011-07-28 MED ORDER — SODIUM CHLORIDE 0.9 % IJ SOLN: 3.0000 mL | Freq: Two times a day (BID) | INTRAMUSCULAR | Status: DC

## 2011-07-28 MED ORDER — SODIUM CHLORIDE 0.9 % IJ SOLN: 3.0000 mL | INTRAMUSCULAR | Status: DC | PRN

## 2011-07-28 NOTE — Anesthesia Postprocedure Evaluation (Signed)
  Anesthesia Post-op Note  Patient: [name]  Procedure(s) Performed: Procedure(s) (LRB): CARDIOVERSION (N/A)  Patient Location: Short Stay  Anesthesia Type: General  Level of Consciousness: awake, alert  and oriented  Airway and Oxygen Therapy: Patient Spontanous Breathing and Patient connected to nasal cannula oxygen  Post-op Pain: none  Post-op Assessment: Post-op Vital signs reviewed, Patient's Cardiovascular Status Stable, Respiratory Function Stable, No signs of Nausea or vomiting and Pain level controlled  Post-op Vital Signs: Reviewed and stable  Complications: No apparent anesthesia complications

## 2011-07-28 NOTE — Anesthesia Preprocedure Evaluation (Addendum)
Anesthesia Evaluation  Patient identified by MRN, date of birth, ID band Patient awake    Reviewed: Allergy & Precautions, H&P , NPO status , Patient's Chart, lab work & pertinent test results, reviewed documented beta blocker date and time   Airway Mallampati: II TM Distance: >3 FB   Mouth opening: Limited Mouth Opening  Dental  (+) Dental Advisory Given and Teeth Intact   Pulmonary sleep apnea ,  breath sounds clear to auscultation        Cardiovascular hypertension, + dysrhythmias Atrial Fibrillation + Valvular Problems/Murmurs MVP Rhythm:Irregular     Neuro/Psych    GI/Hepatic   Endo/Other  Diabetes mellitus-  Renal/GU      Musculoskeletal   Abdominal   Peds  Hematology   Anesthesia Other Findings   Reproductive/Obstetrics                         Anesthesia Physical Anesthesia Plan  ASA: III  Anesthesia Plan: General   Post-op Pain Management:    Induction: Intravenous  Airway Management Planned: Mask  Additional Equipment:   Intra-op Plan:   Post-operative Plan:   Informed Consent: I have reviewed the patients History and Physical, chart, labs and discussed the procedure including the risks, benefits and alternatives for the proposed anesthesia with the patient or authorized representative who has indicated his/her understanding and acceptance.   Dental advisory given  Plan Discussed with: CRNA and Anesthesiologist  Anesthesia Plan Comments:        Anesthesia Quick Evaluation

## 2011-07-28 NOTE — Discharge Instructions (Signed)
Electrical Cardioversion Cardioversion is the delivery of a jolt of electricity to change the rhythm of the heart. Sticky patches or metal paddles are placed on the chest to deliver the electricity from a special device. This is done to restore a normal rhythm. A rhythm that is too fast or not regular keeps the heart from pumping well. Compared to medicines used to change an abnormal rhythm, cardioversion is faster and works better. It is also unpleasant and may dislodge blood clots from the heart. WHEN WOULD THIS BE DONE?  In an emergency:   There is low or no blood pressure as a result of the heart rhythm.   Normal rhythm must be restored as fast as possible to protect the brain and heart from further damage.   It may save a life.   For less serious heart rhythms, such as atrial fibrillation or flutter, in which:   The heart is beating too fast or is not regular.   The heart is still able to pump enough blood, but not as well as it should.   Medicine to change the rhythm has not worked.   It is safe to wait in order to allow time for preparation.  LET YOUR CAREGIVER KNOW ABOUT:   Every medicine you are taking. It is very important to do this! Know when to take or stop taking any of them.   Any time in the past that you have felt your heart was not beating normally.  RISKS AND COMPLICATIONS   Clots may form in the chambers of the heart if it is beating too fast. These clots may be dislodged during the procedure and travel to other parts of the body.   There is risk of a stroke during and after the procedure if a clot moves. Blood thinners lower this risk.   You may have a special test of your heart (TEE) to make sure there are no clots in your heart.  BEFORE THE PROCEDURE   You may have some tests to see how well your heart is working.   You may start taking blood thinners so your blood does not clot as easily.   Other drugs may be given to help your heart work better.   PROCEDURE (SCHEDULED)  The procedure is typically done in a hospital by a heart doctor (cardiologist).   You will be told when and where to go.   You may be given some medicine through an intravenous (IV) access to reduce discomfort and make you sleepy before the procedure.   Your whole body may move when the shock is delivered. Your chest may feel sore.   You may be able to go home after a few hours. Your heart rhythm will be watched to make sure it does not change.  HOME CARE INSTRUCTIONS   Only take medicine as directed by your caregiver. Be sure you understand how and when to take your medicine.   Learn how to feel your pulse and check it often.   Limit your activity for 48 hours.   Avoid caffeine and other stimulants as directed.  SEEK MEDICAL CARE IF:   You feel like your heart is beating too fast or your pulse is not regular.   You have any questions about your medicines.   You have bleeding that will not stop.  SEEK IMMEDIATE MEDICAL CARE IF:   You are dizzy or feel faint.   It is hard to breathe or you feel short of breath.     There is a change in discomfort in your chest.   Your speech is slurred or you have trouble moving your arm or leg on one side.   You get a muscle cramp.   Your fingers or toes turn cold or blue.  MAKE SURE YOU:   Understand these instructions.   Will watch your condition.   Will get help right away if you are not doing well or get worse.  Document Released: 03/31/2002 Document Revised: 03/30/2011 Document Reviewed: 07/31/2007 Exit[employer] Patient Information 2012 [employer]. 

## 2011-07-28 NOTE — Transfer of Care (Signed)
Immediate Anesthesia Transfer of Care Note  Patient: [name]  Procedure(s) Performed: Procedure(s) (LRB): CARDIOVERSION (N/A)  Patient Location: Short Stay  Anesthesia Type: General  Level of Consciousness: awake, alert  and oriented  Airway & Oxygen Therapy: Patient Spontanous Breathing and Patient connected to nasal cannula oxygen  Post-op Assessment: Report given to PACU RN, Post -op Vital signs reviewed and stable and Patient moving all extremities X 4  Post vital signs: Reviewed and stable  Complications: No apparent anesthesia complications

## 2011-07-28 NOTE — Preoperative (Signed)
Beta Blockers   Reason not to administer Beta Blockers:Not Applicable 

## 2011-07-28 NOTE — CV Procedure (Signed)
Electrical Cardioversion Procedure Note [name] [id number] 13-Jan-1946  Procedure: Electrical Cardioversion Indications:  Atrial Flutter  Time Out: Verified patient identification, verified procedure,medications/allergies/relevent history reviewed, required imaging and test results available.  Performed  Procedure Details  The patient was NPO after midnight. Anesthesia was administered at the beside  by Dr.D. Joslin with 40mg  of propofol.  Cardioversion was done with synchronized biphasic defibrillation with AP pads with 100watts.  The patient converted to normal sinus rhythm. The patient tolerated the procedure well   IMPRESSION:  Successful cardioversion of atrial flutter.    Lesleigh Noe 07/28/2011, 9:33 AM

## 2011-07-31 ENCOUNTER — Encounter (HOSPITAL_COMMUNITY): Payer: Self-pay | Admitting: Interventional Cardiology

## 2011-08-02 DIAGNOSIS — R0789 Other chest pain: Secondary | ICD-10-CM | POA: Diagnosis not present

## 2011-08-02 DIAGNOSIS — I4891 Unspecified atrial fibrillation: Secondary | ICD-10-CM | POA: Diagnosis not present

## 2011-08-03 DIAGNOSIS — H2510 Age-related nuclear cataract, unspecified eye: Secondary | ICD-10-CM | POA: Diagnosis not present

## 2011-08-08 DIAGNOSIS — I129 Hypertensive chronic kidney disease with stage 1 through stage 4 chronic kidney disease, or unspecified chronic kidney disease: Secondary | ICD-10-CM | POA: Diagnosis not present

## 2011-08-08 DIAGNOSIS — R51 Headache: Secondary | ICD-10-CM | POA: Diagnosis not present

## 2011-08-08 DIAGNOSIS — N183 Chronic kidney disease, stage 3 unspecified: Secondary | ICD-10-CM | POA: Diagnosis not present

## 2011-08-16 DIAGNOSIS — R0789 Other chest pain: Secondary | ICD-10-CM | POA: Diagnosis not present

## 2011-08-16 DIAGNOSIS — I4891 Unspecified atrial fibrillation: Secondary | ICD-10-CM | POA: Diagnosis not present

## 2011-08-23 ENCOUNTER — Ambulatory Visit: Payer: Medicare Other | Admitting: Pulmonary Disease

## 2011-08-30 DIAGNOSIS — IMO0001 Reserved for inherently not codable concepts without codable children: Secondary | ICD-10-CM | POA: Diagnosis not present

## 2011-09-02 DIAGNOSIS — Z794 Long term (current) use of insulin: Secondary | ICD-10-CM | POA: Diagnosis not present

## 2011-09-02 DIAGNOSIS — Z7901 Long term (current) use of anticoagulants: Secondary | ICD-10-CM | POA: Diagnosis not present

## 2011-09-02 DIAGNOSIS — R52 Pain, unspecified: Secondary | ICD-10-CM | POA: Diagnosis not present

## 2011-09-02 DIAGNOSIS — M5030 Other cervical disc degeneration, unspecified cervical region: Secondary | ICD-10-CM | POA: Diagnosis not present

## 2011-09-02 DIAGNOSIS — S8000XA Contusion of unspecified knee, initial encounter: Secondary | ICD-10-CM | POA: Diagnosis not present

## 2011-09-02 DIAGNOSIS — E119 Type 2 diabetes mellitus without complications: Secondary | ICD-10-CM | POA: Diagnosis not present

## 2011-09-02 DIAGNOSIS — M1710 Unilateral primary osteoarthritis, unspecified knee: Secondary | ICD-10-CM | POA: Diagnosis not present

## 2011-09-02 DIAGNOSIS — Z880 Allergy status to penicillin: Secondary | ICD-10-CM | POA: Diagnosis not present

## 2011-09-02 DIAGNOSIS — I4891 Unspecified atrial fibrillation: Secondary | ICD-10-CM | POA: Diagnosis not present

## 2011-09-02 DIAGNOSIS — S0993XA Unspecified injury of face, initial encounter: Secondary | ICD-10-CM | POA: Diagnosis not present

## 2011-09-02 DIAGNOSIS — G4730 Sleep apnea, unspecified: Secondary | ICD-10-CM | POA: Diagnosis not present

## 2011-09-02 DIAGNOSIS — S139XXA Sprain of joints and ligaments of unspecified parts of neck, initial encounter: Secondary | ICD-10-CM | POA: Diagnosis not present

## 2011-09-02 DIAGNOSIS — N19 Unspecified kidney failure: Secondary | ICD-10-CM | POA: Diagnosis not present

## 2011-09-02 DIAGNOSIS — S298XXA Other specified injuries of thorax, initial encounter: Secondary | ICD-10-CM | POA: Diagnosis not present

## 2011-09-02 DIAGNOSIS — IMO0001 Reserved for inherently not codable concepts without codable children: Secondary | ICD-10-CM | POA: Diagnosis not present

## 2011-09-02 DIAGNOSIS — S199XXA Unspecified injury of neck, initial encounter: Secondary | ICD-10-CM | POA: Diagnosis not present

## 2011-09-02 DIAGNOSIS — R5382 Chronic fatigue, unspecified: Secondary | ICD-10-CM | POA: Diagnosis not present

## 2011-09-02 DIAGNOSIS — E785 Hyperlipidemia, unspecified: Secondary | ICD-10-CM | POA: Diagnosis not present

## 2011-09-02 DIAGNOSIS — IMO0002 Reserved for concepts with insufficient information to code with codable children: Secondary | ICD-10-CM | POA: Diagnosis not present

## 2011-09-02 DIAGNOSIS — Z79899 Other long term (current) drug therapy: Secondary | ICD-10-CM | POA: Diagnosis not present

## 2011-09-02 DIAGNOSIS — I10 Essential (primary) hypertension: Secondary | ICD-10-CM | POA: Diagnosis not present

## 2011-09-05 ENCOUNTER — Ambulatory Visit: Payer: Medicare Other | Admitting: Pulmonary Disease

## 2011-09-21 DIAGNOSIS — I10 Essential (primary) hypertension: Secondary | ICD-10-CM | POA: Diagnosis not present

## 2011-09-21 DIAGNOSIS — M79609 Pain in unspecified limb: Secondary | ICD-10-CM | POA: Diagnosis not present

## 2011-09-21 DIAGNOSIS — Z79899 Other long term (current) drug therapy: Secondary | ICD-10-CM | POA: Diagnosis not present

## 2011-09-21 DIAGNOSIS — R0602 Shortness of breath: Secondary | ICD-10-CM | POA: Diagnosis not present

## 2011-09-27 DIAGNOSIS — F411 Generalized anxiety disorder: Secondary | ICD-10-CM | POA: Diagnosis not present

## 2011-09-27 DIAGNOSIS — IMO0001 Reserved for inherently not codable concepts without codable children: Secondary | ICD-10-CM | POA: Diagnosis not present

## 2011-09-27 DIAGNOSIS — M542 Cervicalgia: Secondary | ICD-10-CM | POA: Diagnosis not present

## 2011-09-27 DIAGNOSIS — M79609 Pain in unspecified limb: Secondary | ICD-10-CM | POA: Diagnosis not present

## 2011-10-10 ENCOUNTER — Ambulatory Visit: Payer: Medicare Other | Admitting: Pulmonary Disease

## 2011-10-12 DIAGNOSIS — E119 Type 2 diabetes mellitus without complications: Secondary | ICD-10-CM | POA: Diagnosis not present

## 2011-10-13 DIAGNOSIS — IMO0001 Reserved for inherently not codable concepts without codable children: Secondary | ICD-10-CM | POA: Diagnosis not present

## 2011-10-13 DIAGNOSIS — N289 Disorder of kidney and ureter, unspecified: Secondary | ICD-10-CM | POA: Diagnosis not present

## 2011-10-13 DIAGNOSIS — E669 Obesity, unspecified: Secondary | ICD-10-CM | POA: Diagnosis not present

## 2011-10-18 DIAGNOSIS — R5381 Other malaise: Secondary | ICD-10-CM | POA: Diagnosis not present

## 2011-10-18 DIAGNOSIS — H2510 Age-related nuclear cataract, unspecified eye: Secondary | ICD-10-CM | POA: Diagnosis not present

## 2011-10-18 DIAGNOSIS — R404 Transient alteration of awareness: Secondary | ICD-10-CM | POA: Diagnosis not present

## 2011-10-18 DIAGNOSIS — M542 Cervicalgia: Secondary | ICD-10-CM | POA: Diagnosis not present

## 2011-11-07 DIAGNOSIS — Z79899 Other long term (current) drug therapy: Secondary | ICD-10-CM | POA: Diagnosis not present

## 2011-11-07 DIAGNOSIS — I4891 Unspecified atrial fibrillation: Secondary | ICD-10-CM | POA: Diagnosis not present

## 2011-11-07 DIAGNOSIS — I10 Essential (primary) hypertension: Secondary | ICD-10-CM | POA: Diagnosis not present

## 2011-11-14 DIAGNOSIS — M542 Cervicalgia: Secondary | ICD-10-CM | POA: Diagnosis not present

## 2011-11-16 DIAGNOSIS — M542 Cervicalgia: Secondary | ICD-10-CM | POA: Diagnosis not present

## 2011-11-22 DIAGNOSIS — M7710 Lateral epicondylitis, unspecified elbow: Secondary | ICD-10-CM | POA: Diagnosis not present

## 2011-11-22 DIAGNOSIS — M19049 Primary osteoarthritis, unspecified hand: Secondary | ICD-10-CM | POA: Diagnosis not present

## 2011-11-23 ENCOUNTER — Ambulatory Visit: Payer: Medicare Other | Admitting: Pulmonary Disease

## 2011-11-30 DIAGNOSIS — M542 Cervicalgia: Secondary | ICD-10-CM | POA: Diagnosis not present

## 2011-12-05 DIAGNOSIS — M542 Cervicalgia: Secondary | ICD-10-CM | POA: Diagnosis not present

## 2012-01-02 DIAGNOSIS — H2510 Age-related nuclear cataract, unspecified eye: Secondary | ICD-10-CM | POA: Diagnosis not present

## 2012-01-29 DIAGNOSIS — H2589 Other age-related cataract: Secondary | ICD-10-CM | POA: Diagnosis not present

## 2012-01-29 DIAGNOSIS — H2510 Age-related nuclear cataract, unspecified eye: Secondary | ICD-10-CM | POA: Diagnosis not present

## 2012-02-20 ENCOUNTER — Other Ambulatory Visit: Payer: Self-pay | Admitting: Internal Medicine

## 2012-02-20 DIAGNOSIS — F329 Major depressive disorder, single episode, unspecified: Secondary | ICD-10-CM | POA: Diagnosis not present

## 2012-02-20 DIAGNOSIS — R1310 Dysphagia, unspecified: Secondary | ICD-10-CM

## 2012-02-20 DIAGNOSIS — Z23 Encounter for immunization: Secondary | ICD-10-CM | POA: Diagnosis not present

## 2012-02-28 ENCOUNTER — Other Ambulatory Visit: Payer: Medicare Other

## 2012-02-28 ENCOUNTER — Inpatient Hospital Stay: Admission: RE | Admit: 2012-02-28 | Payer: Medicare Other | Source: Ambulatory Visit

## 2012-03-08 DIAGNOSIS — IMO0001 Reserved for inherently not codable concepts without codable children: Secondary | ICD-10-CM | POA: Diagnosis not present

## 2012-03-08 DIAGNOSIS — E669 Obesity, unspecified: Secondary | ICD-10-CM | POA: Diagnosis not present

## 2012-03-08 DIAGNOSIS — N289 Disorder of kidney and ureter, unspecified: Secondary | ICD-10-CM | POA: Diagnosis not present

## 2012-03-14 ENCOUNTER — Other Ambulatory Visit: Payer: Medicare Other

## 2012-04-02 DIAGNOSIS — E7800 Pure hypercholesterolemia, unspecified: Secondary | ICD-10-CM | POA: Diagnosis not present

## 2012-04-02 DIAGNOSIS — Z79899 Other long term (current) drug therapy: Secondary | ICD-10-CM | POA: Diagnosis not present

## 2012-05-07 DIAGNOSIS — Z7901 Long term (current) use of anticoagulants: Secondary | ICD-10-CM | POA: Diagnosis not present

## 2012-05-07 DIAGNOSIS — Z79899 Other long term (current) drug therapy: Secondary | ICD-10-CM | POA: Diagnosis not present

## 2012-05-07 DIAGNOSIS — I10 Essential (primary) hypertension: Secondary | ICD-10-CM | POA: Diagnosis not present

## 2012-05-07 DIAGNOSIS — I4891 Unspecified atrial fibrillation: Secondary | ICD-10-CM | POA: Diagnosis not present

## 2012-05-07 DIAGNOSIS — R350 Frequency of micturition: Secondary | ICD-10-CM | POA: Diagnosis not present

## 2012-05-08 ENCOUNTER — Other Ambulatory Visit: Payer: Self-pay | Admitting: Geriatric Medicine

## 2012-05-08 DIAGNOSIS — Z1231 Encounter for screening mammogram for malignant neoplasm of breast: Secondary | ICD-10-CM

## 2012-05-20 ENCOUNTER — Encounter: Payer: Self-pay | Admitting: Pulmonary Disease

## 2012-05-31 ENCOUNTER — Other Ambulatory Visit: Payer: Self-pay | Admitting: Geriatric Medicine

## 2012-05-31 ENCOUNTER — Other Ambulatory Visit: Payer: Medicare Other

## 2012-05-31 ENCOUNTER — Ambulatory Visit: Payer: Medicare Other | Admitting: Pulmonary Disease

## 2012-05-31 DIAGNOSIS — R1310 Dysphagia, unspecified: Secondary | ICD-10-CM

## 2012-05-31 DIAGNOSIS — F329 Major depressive disorder, single episode, unspecified: Secondary | ICD-10-CM | POA: Diagnosis not present

## 2012-05-31 DIAGNOSIS — R5381 Other malaise: Secondary | ICD-10-CM | POA: Diagnosis not present

## 2012-05-31 DIAGNOSIS — Z0000 Encounter for general adult medical examination without abnormal findings: Secondary | ICD-10-CM | POA: Diagnosis not present

## 2012-05-31 DIAGNOSIS — I10 Essential (primary) hypertension: Secondary | ICD-10-CM | POA: Diagnosis not present

## 2012-05-31 DIAGNOSIS — R5383 Other fatigue: Secondary | ICD-10-CM | POA: Diagnosis not present

## 2012-05-31 DIAGNOSIS — I4891 Unspecified atrial fibrillation: Secondary | ICD-10-CM | POA: Diagnosis not present

## 2012-05-31 DIAGNOSIS — E119 Type 2 diabetes mellitus without complications: Secondary | ICD-10-CM | POA: Diagnosis not present

## 2012-06-05 ENCOUNTER — Ambulatory Visit: Payer: Medicare Other

## 2012-06-18 ENCOUNTER — Other Ambulatory Visit: Payer: Medicare Other

## 2012-06-18 DIAGNOSIS — E669 Obesity, unspecified: Secondary | ICD-10-CM | POA: Diagnosis not present

## 2012-06-18 DIAGNOSIS — N289 Disorder of kidney and ureter, unspecified: Secondary | ICD-10-CM | POA: Diagnosis not present

## 2012-06-18 DIAGNOSIS — IMO0001 Reserved for inherently not codable concepts without codable children: Secondary | ICD-10-CM | POA: Diagnosis not present

## 2012-06-19 DIAGNOSIS — M7710 Lateral epicondylitis, unspecified elbow: Secondary | ICD-10-CM | POA: Diagnosis not present

## 2012-06-19 DIAGNOSIS — M19049 Primary osteoarthritis, unspecified hand: Secondary | ICD-10-CM | POA: Diagnosis not present

## 2012-06-24 ENCOUNTER — Ambulatory Visit: Payer: Medicare Other | Admitting: Pulmonary Disease

## 2012-07-09 ENCOUNTER — Ambulatory Visit: Payer: Medicare Other

## 2012-07-09 ENCOUNTER — Ambulatory Visit: Payer: Medicare Other | Admitting: Pulmonary Disease

## 2012-07-18 ENCOUNTER — Ambulatory Visit
Admission: RE | Admit: 2012-07-18 | Discharge: 2012-07-18 | Disposition: A | Discharge status: Home / Self Care | Payer: Medicare Other | Source: Ambulatory Visit | Attending: Geriatric Medicine | Admitting: Geriatric Medicine

## 2012-07-18 DIAGNOSIS — R1310 Dysphagia, unspecified: Secondary | ICD-10-CM

## 2012-07-18 DIAGNOSIS — K2289 Other specified disease of esophagus: Secondary | ICD-10-CM | POA: Diagnosis not present

## 2012-07-18 DIAGNOSIS — K228 Other specified diseases of esophagus: Secondary | ICD-10-CM | POA: Diagnosis not present

## 2012-07-25 DIAGNOSIS — E119 Type 2 diabetes mellitus without complications: Secondary | ICD-10-CM | POA: Diagnosis not present

## 2012-07-25 DIAGNOSIS — K219 Gastro-esophageal reflux disease without esophagitis: Secondary | ICD-10-CM | POA: Diagnosis not present

## 2012-07-25 DIAGNOSIS — S61209A Unspecified open wound of unspecified finger without damage to nail, initial encounter: Secondary | ICD-10-CM | POA: Diagnosis not present

## 2012-07-25 DIAGNOSIS — E785 Hyperlipidemia, unspecified: Secondary | ICD-10-CM | POA: Diagnosis not present

## 2012-07-25 DIAGNOSIS — M129 Arthropathy, unspecified: Secondary | ICD-10-CM | POA: Diagnosis not present

## 2012-07-25 DIAGNOSIS — IMO0001 Reserved for inherently not codable concepts without codable children: Secondary | ICD-10-CM | POA: Diagnosis not present

## 2012-07-25 DIAGNOSIS — I10 Essential (primary) hypertension: Secondary | ICD-10-CM | POA: Diagnosis not present

## 2012-07-31 DIAGNOSIS — Z79899 Other long term (current) drug therapy: Secondary | ICD-10-CM | POA: Diagnosis not present

## 2012-08-09 ENCOUNTER — Ambulatory Visit: Payer: Medicare Other

## 2012-10-02 ENCOUNTER — Other Ambulatory Visit: Payer: Self-pay | Admitting: Gastroenterology

## 2012-10-03 DIAGNOSIS — E119 Type 2 diabetes mellitus without complications: Secondary | ICD-10-CM | POA: Diagnosis not present

## 2012-10-03 DIAGNOSIS — N289 Disorder of kidney and ureter, unspecified: Secondary | ICD-10-CM | POA: Diagnosis not present

## 2012-10-03 DIAGNOSIS — E669 Obesity, unspecified: Secondary | ICD-10-CM | POA: Diagnosis not present

## 2012-10-03 DIAGNOSIS — Z23 Encounter for immunization: Secondary | ICD-10-CM | POA: Diagnosis not present

## 2012-10-18 ENCOUNTER — Encounter (HOSPITAL_COMMUNITY): Payer: Self-pay | Admitting: *Deleted

## 2012-10-18 ENCOUNTER — Encounter (HOSPITAL_COMMUNITY): Payer: Self-pay | Admitting: Pharmacy Technician

## 2012-10-18 DIAGNOSIS — G4733 Obstructive sleep apnea (adult) (pediatric): Secondary | ICD-10-CM

## 2012-10-18 DIAGNOSIS — E559 Vitamin D deficiency, unspecified: Secondary | ICD-10-CM

## 2012-10-18 HISTORY — DX: Vitamin D deficiency, unspecified: E55.9

## 2012-10-18 HISTORY — PX: CATARACT EXTRACTION: SUR2

## 2012-10-18 HISTORY — DX: Obstructive sleep apnea (adult) (pediatric): G47.33

## 2012-10-29 ENCOUNTER — Ambulatory Visit
Admission: RE | Admit: 2012-10-29 | Discharge: 2012-10-29 | Disposition: A | Discharge status: Home / Self Care | Payer: Medicare Other | Source: Ambulatory Visit | Attending: Geriatric Medicine | Admitting: Geriatric Medicine

## 2012-10-29 DIAGNOSIS — Z1231 Encounter for screening mammogram for malignant neoplasm of breast: Secondary | ICD-10-CM | POA: Diagnosis not present

## 2012-11-04 DIAGNOSIS — H2510 Age-related nuclear cataract, unspecified eye: Secondary | ICD-10-CM | POA: Diagnosis not present

## 2012-11-04 NOTE — Progress Notes (Signed)
Pt stated she had not stopped her xarelto yet, procedure is 11-05-12, pt given dr Laural Benes office phone number to make dr Laural Benes aware xarelto not stopped

## 2012-11-05 ENCOUNTER — Ambulatory Visit (HOSPITAL_COMMUNITY): Admission: RE | Admit: 2012-11-05 | Payer: Medicare Other | Source: Ambulatory Visit | Admitting: Gastroenterology

## 2012-11-05 HISTORY — DX: Gastro-esophageal reflux disease without esophagitis: K21.9

## 2012-11-05 HISTORY — DX: Fibromyalgia: M79.7

## 2012-11-05 HISTORY — DX: Cardiac arrhythmia, unspecified: I49.9

## 2012-11-05 HISTORY — DX: Vitamin D deficiency, unspecified: E55.9

## 2012-11-05 SURGERY — ESOPHAGOGASTRODUODENOSCOPY (EGD) WITH PROPOFOL
Anesthesia: Monitor Anesthesia Care

## 2012-11-06 DIAGNOSIS — Z79899 Other long term (current) drug therapy: Secondary | ICD-10-CM | POA: Diagnosis not present

## 2012-11-06 DIAGNOSIS — E119 Type 2 diabetes mellitus without complications: Secondary | ICD-10-CM | POA: Diagnosis not present

## 2012-11-06 DIAGNOSIS — F329 Major depressive disorder, single episode, unspecified: Secondary | ICD-10-CM | POA: Diagnosis not present

## 2012-11-06 DIAGNOSIS — R1310 Dysphagia, unspecified: Secondary | ICD-10-CM | POA: Diagnosis not present

## 2012-11-06 DIAGNOSIS — Z7901 Long term (current) use of anticoagulants: Secondary | ICD-10-CM | POA: Diagnosis not present

## 2012-11-06 DIAGNOSIS — I4891 Unspecified atrial fibrillation: Secondary | ICD-10-CM | POA: Diagnosis not present

## 2012-11-06 DIAGNOSIS — I4892 Unspecified atrial flutter: Secondary | ICD-10-CM | POA: Diagnosis not present

## 2012-11-06 DIAGNOSIS — I10 Essential (primary) hypertension: Secondary | ICD-10-CM | POA: Diagnosis not present

## 2012-11-11 ENCOUNTER — Other Ambulatory Visit: Payer: Self-pay | Admitting: Gastroenterology

## 2012-11-29 NOTE — Progress Notes (Signed)
11-29-12 1420 Spoke with patient after she called PAT department,desiring instructions about her meds Am of procedure planned 12-03-12. Med list reviewed with patient-instructed to nothing by mouth past midnight night before except take meds with sips of water: Amiodarone.Nexium.Metoprolol.Oxycodone.Lyrica. Stop Xarelto x3 day prior as per MD office instructions(last dose to be 11-29-12 PM). Lantus insulin (1/2 usual) PM dose night before. No Insulin or diabetic meds AM of procedure. Procedure time is 1300, arrive at 1130 AM. Call [phone] or Dr. Henriette Combs office for further concerns. W. Kennon Portela

## 2012-12-03 ENCOUNTER — Ambulatory Visit (HOSPITAL_COMMUNITY): Payer: Medicare Other | Admitting: Anesthesiology

## 2012-12-03 ENCOUNTER — Ambulatory Visit (HOSPITAL_COMMUNITY)
Admission: RE | Admit: 2012-12-03 | Discharge: 2012-12-03 | Disposition: A | Discharge status: Home / Self Care | Payer: Medicare Other | Source: Ambulatory Visit | Attending: Gastroenterology | Admitting: Gastroenterology

## 2012-12-03 ENCOUNTER — Encounter (HOSPITAL_COMMUNITY): Payer: Self-pay | Admitting: Anesthesiology

## 2012-12-03 ENCOUNTER — Encounter (HOSPITAL_COMMUNITY)
Admission: RE | Disposition: A | Discharge status: Home / Self Care | Payer: Self-pay | Source: Ambulatory Visit | Attending: Gastroenterology

## 2012-12-03 DIAGNOSIS — E0789 Other specified disorders of thyroid: Secondary | ICD-10-CM | POA: Insufficient documentation

## 2012-12-03 DIAGNOSIS — Z91041 Radiographic dye allergy status: Secondary | ICD-10-CM | POA: Insufficient documentation

## 2012-12-03 DIAGNOSIS — R12 Heartburn: Secondary | ICD-10-CM | POA: Insufficient documentation

## 2012-12-03 DIAGNOSIS — E119 Type 2 diabetes mellitus without complications: Secondary | ICD-10-CM | POA: Insufficient documentation

## 2012-12-03 DIAGNOSIS — R1319 Other dysphagia: Secondary | ICD-10-CM | POA: Insufficient documentation

## 2012-12-03 DIAGNOSIS — Z79899 Other long term (current) drug therapy: Secondary | ICD-10-CM | POA: Insufficient documentation

## 2012-12-03 DIAGNOSIS — I10 Essential (primary) hypertension: Secondary | ICD-10-CM | POA: Insufficient documentation

## 2012-12-03 DIAGNOSIS — R933 Abnormal findings on diagnostic imaging of other parts of digestive tract: Secondary | ICD-10-CM | POA: Insufficient documentation

## 2012-12-03 DIAGNOSIS — G4730 Sleep apnea, unspecified: Secondary | ICD-10-CM | POA: Insufficient documentation

## 2012-12-03 DIAGNOSIS — R1310 Dysphagia, unspecified: Secondary | ICD-10-CM | POA: Diagnosis not present

## 2012-12-03 DIAGNOSIS — I4891 Unspecified atrial fibrillation: Secondary | ICD-10-CM | POA: Insufficient documentation

## 2012-12-03 DIAGNOSIS — D131 Benign neoplasm of stomach: Secondary | ICD-10-CM | POA: Insufficient documentation

## 2012-12-03 HISTORY — PX: BALLOON DILATION: SHX5330

## 2012-12-03 HISTORY — PX: ESOPHAGOGASTRODUODENOSCOPY (EGD) WITH PROPOFOL: SHX5813

## 2012-12-03 HISTORY — DX: Adverse effect of unspecified anesthetic, initial encounter: T41.45XA

## 2012-12-03 LAB — GLUCOSE, CAPILLARY
Glucose-Capillary: 116 mg/dL — ABNORMAL HIGH (ref 70–99)
Glucose-Capillary: 127 mg/dL — ABNORMAL HIGH (ref 70–99)

## 2012-12-03 SURGERY — ESOPHAGOGASTRODUODENOSCOPY (EGD) WITH PROPOFOL
Anesthesia: Monitor Anesthesia Care

## 2012-12-03 MED ORDER — MIDAZOLAM HCL 5 MG/5ML IJ SOLN
INTRAMUSCULAR | Status: DC | PRN
  Administered 2012-12-03: 2 mg via INTRAVENOUS

## 2012-12-03 MED ORDER — LACTATED RINGERS IV SOLN: INTRAVENOUS | Status: DC

## 2012-12-03 MED ORDER — DEXTROSE 5 % IV SOLN
INTRAVENOUS | Status: DC
  Administered 2012-12-03: 12:00:00 via INTRAVENOUS

## 2012-12-03 MED ORDER — FENTANYL CITRATE 0.05 MG/ML IJ SOLN
INTRAMUSCULAR | Status: DC | PRN
  Administered 2012-12-03: 100 ug via INTRAVENOUS

## 2012-12-03 MED ORDER — ONDANSETRON HCL 4 MG/2ML IJ SOLN
INTRAMUSCULAR | Status: DC | PRN
  Administered 2012-12-03: 4 mg via INTRAVENOUS

## 2012-12-03 MED ORDER — KETAMINE HCL 10 MG/ML IJ SOLN
INTRAMUSCULAR | Status: DC | PRN
  Administered 2012-12-03: 20 mg via INTRAVENOUS

## 2012-12-03 MED ORDER — BUTAMBEN-TETRACAINE-BENZOCAINE 2-2-14 % EX AERO
INHALATION_SPRAY | CUTANEOUS | Status: DC | PRN
  Administered 2012-12-03: 1 via TOPICAL

## 2012-12-03 MED ORDER — PROPOFOL INFUSION 10 MG/ML OPTIME
INTRAVENOUS | Status: DC | PRN
  Administered 2012-12-03: 70 ug/kg/min via INTRAVENOUS

## 2012-12-03 MED ORDER — SODIUM CHLORIDE 0.9 % IV SOLN: INTRAVENOUS | Status: DC

## 2012-12-03 SURGICAL SUPPLY — 15 items

## 2012-12-03 NOTE — H&P (Signed)
  Problem: Esophageal dysphagia. Barium esophagram showed obstruction at the esophagogastric junction rule out esophageal stricture versus achalasia  History: The patient is a 67 year old female born 08/02/1945. She underwent a normal screening colonoscopy and esophagogastroduodenoscopy in August 2007.  The patient describes intermittent heartburn and intermittent dysphagia unassociated with odynophagia. She chronically takes is xarelto to treat chronic atrial fibrillation.  The patient is scheduled to undergo a diagnostic esophagogastroduodenoscopy with possible dilation of a stricture at the esophagogastric junction.  Past medical history: Type 2 diabetes mellitus. Fibromyalgia syndrome. Chronic fatigue syndrome. Endometriosis. Hypertension. Osteoarthritis. Sleep apnea syndrome. Chronic atrial fibrillation. Benign breast biopsy. Bilateral tubal ligation. Hysterectomy. Thyroidectomy. Cataract surgery.  Allergies: Myelogram dye cause angioedema.  Exam: The patient is alert and lying comfortably on the endoscopy stretcher. The patient has chronic atrial fibrillation. Abdomen is soft and nontender to palpation. Lungs are clear to auscultation.  Plan: Proceed with diagnostic esophagogastroduodenoscopy to evaluate obstruction at the esophagogastric junction visualized by barium esophagram.

## 2012-12-03 NOTE — Anesthesia Preprocedure Evaluation (Signed)
Anesthesia Evaluation  Patient identified by MRN, date of birth, ID band Patient awake    Reviewed: Allergy & Precautions, H&P , NPO status , Patient's Chart, lab work & pertinent test results  Airway Mallampati: II TM Distance: >3 FB Neck ROM: Full    Dental no notable dental hx.    Pulmonary sleep apnea ,  breath sounds clear to auscultation  Pulmonary exam normal       Cardiovascular hypertension, Pt. on medications negative cardio ROS  + dysrhythmias Rhythm:Regular Rate:Normal     Neuro/Psych PSYCHIATRIC DISORDERS  Neuromuscular disease    GI/Hepatic negative GI ROS, Neg liver ROS, GERD-  Medicated,  Endo/Other  diabetes, Type 1, Insulin Dependent  Renal/GU negative Renal ROS  negative genitourinary   Musculoskeletal  (+) Fibromyalgia -  Abdominal   Peds negative pediatric ROS (+)  Hematology negative hematology ROS (+)   Anesthesia Other Findings   Reproductive/Obstetrics negative OB ROS                           Anesthesia Physical Anesthesia Plan  ASA: III  Anesthesia Plan: General and MAC   Post-op Pain Management:    Induction: Intravenous  Airway Management Planned:   Additional Equipment:   Intra-op Plan:   Post-operative Plan:   Informed Consent: I have reviewed the patients History and Physical, chart, labs and discussed the procedure including the risks, benefits and alternatives for the proposed anesthesia with the patient or authorized representative who has indicated his/her understanding and acceptance.   Dental advisory given  Plan Discussed with: CRNA  Anesthesia Plan Comments:         Anesthesia Quick Evaluation

## 2012-12-03 NOTE — Transfer of Care (Signed)
Immediate Anesthesia Transfer of Care Note  Patient: [name]  Procedure(s) Performed: Procedure(s): ESOPHAGOGASTRODUODENOSCOPY (EGD) WITH Dilation & w/PROPOFOL (N/A) BALLOON DILATION (N/A)  Patient Location: PACU  Anesthesia Type:MAC  Level of Consciousness: sedated  Airway & Oxygen Therapy: Patient Spontanous Breathing and Patient connected to nasal cannula oxygen  Post-op Assessment: Report given to PACU RN and Post -op Vital signs reviewed and stable  Post vital signs: Reviewed and stable  Complications: No apparent anesthesia complications

## 2012-12-03 NOTE — Anesthesia Postprocedure Evaluation (Signed)
  Anesthesia Post-op Note  Patient: [name]  Procedure(s) Performed: Procedure(s) (LRB): ESOPHAGOGASTRODUODENOSCOPY (EGD) WITH Dilation & w/PROPOFOL (N/A) BALLOON DILATION (N/A)  Patient Location: PACU  Anesthesia Type: MAC  Level of Consciousness: awake and alert   Airway and Oxygen Therapy: Patient Spontanous Breathing  Post-op Pain: mild  Post-op Assessment: Post-op Vital signs reviewed, Patient's Cardiovascular Status Stable, Respiratory Function Stable, Patent Airway and No signs of Nausea or vomiting  Last Vitals:  Filed Vitals:   12/03/12 1333  BP: 104/71  Pulse:   Temp:   Resp: 11    Post-op Vital Signs: stable   Complications: No apparent anesthesia complications

## 2012-12-03 NOTE — Op Note (Signed)
Problem: Esophageal dysphagia. Barium esophagram showed obstruction at the esophagogastric junction rule out stricture versus achalasia  Endoscopist: Danise Edge  Premedication: Propofol administered by anesthesia  Procedure: Diagnostic esophagogastroduodenoscopy with gastric polypectomy The patient was placed in the left lateral decubitus position. The Pentax gastroscope was passed through the posterior hypopharynx into the proximal esophagus without difficulty. The hypopharynx, larynx, and vocal cords appeared normal.  Esophagoscopy: The proximal, mid, and lower segments of the esophageal mucosa appear normal. The squamocolumnar junction is regular and noted at 40 cm from the incisor teeth. There is no endoscopic evidence for the presence of esophageal obstruction or esophageal stricture formation.  Gastroscopy: Retroflex view of the gastric cardia and fundus was normal. A 1 cm pedunculated polyp in the mid gastric body on the greater curvature was removed with in piecemeal fashion with the electrocautery snare with an Endo Clip placed at the polyp base. A 5 mm sessile polyp in the mid gastric body on the greater curvature was removed with the electrocautery snare; post polypectomy oozing was controlled with application of an Endo Clip. The gastric antrum and pylorus appeared normal.  Duodenoscopy: The duodenal bulb and descending duodenum appeared normal  Assessment  #1. Normal esophagoscopy. No signs of esophageal obstruction or esophageal stricture formation. I cannot rule out achalasia  #2. A 1 cm pedunculated polyp in the mid gastric body on the greater curvature was removed with the left snare in piecemeal fashion; an Endo Clip was applied to the base of the polyp.  #3. A 5 mm sessile polyp in the mid gastric body on the greater curvature was removed with the electrocautery snare; to control post polypectomy oozing an Endo Clip was applied.  Recommendations: Schedule esophageal  manometry to rule out achalasia. Resume taking xarelto anti-coagulant in 5 days.

## 2012-12-04 ENCOUNTER — Encounter (HOSPITAL_COMMUNITY): Payer: Self-pay | Admitting: Gastroenterology

## 2012-12-15 NOTE — H&P (Signed)
  Problem: Dysphagia rule out achalasia  History: The patient is a 67 year old female born [birth year]. She underwent a normal screening colonoscopy and esophagogastroduodenoscopy in August 2007.  To evaluate esophageal dysphagia, the patient underwent a barium esophagram which showed obstruction at the esophagogastric junction.  On December 03, 2012, the patient underwent a diagnostic esophagogastroduodenoscopy which showed a normal esophagus without obstruction.the squamocolumnar junction was noted at 40 cm from the incisor teeth. A 1 cm pedunculated hyperplastic polyp was removed from the mid gastric body and a 5 mm hyperplastic gastric polyp was removed from the mid gastric body.The esophagogastroduodenoscopy was otherwise normal.  The patient is scheduled to undergo a diagnostic esophageal manometry to look for achalasia.  Past medical history: Type 2 diabetes mellitus. Fibromyalgia syndrome. Chronic fatigue syndrome. Endometriosis. Hypertension. Osteoarthritis of the cervical spine. Benign breast biopsy. Bilateral tubal ligation in 1983. Hysterectomy in 1978. Cardiac catheterization in 1989. Thyroidectomy in 1987. Cataract surgery in 2013.  Allergies: Myelogram dye caused angioedema. Avandia caused weight gain and edema. Demerol and morphine caused anxiety. Penicillin caused angioedema. Victoza caused headaches. Pradaxa caused headaches.  Habits: The patient quit smoking cigarettes in 1972. She does not consume alcohol.  Plan: Proceed with diagnostic esophageal manometry to look for achalasia as a cause for esophageal dysphagia.

## 2012-12-17 DIAGNOSIS — N183 Chronic kidney disease, stage 3 unspecified: Secondary | ICD-10-CM | POA: Diagnosis not present

## 2012-12-17 DIAGNOSIS — M542 Cervicalgia: Secondary | ICD-10-CM | POA: Diagnosis not present

## 2012-12-17 DIAGNOSIS — Z79899 Other long term (current) drug therapy: Secondary | ICD-10-CM | POA: Diagnosis not present

## 2012-12-17 DIAGNOSIS — E7800 Pure hypercholesterolemia, unspecified: Secondary | ICD-10-CM | POA: Diagnosis not present

## 2012-12-17 DIAGNOSIS — I129 Hypertensive chronic kidney disease with stage 1 through stage 4 chronic kidney disease, or unspecified chronic kidney disease: Secondary | ICD-10-CM | POA: Diagnosis not present

## 2013-01-06 ENCOUNTER — Ambulatory Visit (HOSPITAL_COMMUNITY): Admission: RE | Admit: 2013-01-06 | Payer: Medicare Other | Source: Ambulatory Visit | Admitting: Gastroenterology

## 2013-01-06 ENCOUNTER — Encounter (HOSPITAL_COMMUNITY): Admission: RE | Payer: Self-pay | Source: Ambulatory Visit

## 2013-01-06 SURGERY — MANOMETRY, ESOPHAGUS
Anesthesia: Topical

## 2013-01-24 ENCOUNTER — Telehealth: Payer: Self-pay | Admitting: Interventional Cardiology

## 2013-01-24 NOTE — Telephone Encounter (Signed)
Patient was actually calling back about her father.

## 2013-01-24 NOTE — Telephone Encounter (Signed)
New Problem:  Pt states she is returning Melinda's phone call.

## 2013-02-24 ENCOUNTER — Encounter: Payer: Self-pay | Admitting: Interventional Cardiology

## 2013-02-25 ENCOUNTER — Telehealth: Payer: Self-pay

## 2013-02-25 NOTE — Telephone Encounter (Signed)
ERROR

## 2013-02-26 MED ORDER — AMIODARONE HCL 200 MG PO TABS: 200.0000 mg | ORAL_TABLET | Freq: Every day | ORAL | Status: DC

## 2013-02-26 NOTE — Telephone Encounter (Signed)
Done

## 2013-02-26 NOTE — Addendum Note (Signed)
Addended by: Jarvis Newcomer on: 02/26/2013 07:41 AM   Modules accepted: Orders

## 2013-03-10 ENCOUNTER — Other Ambulatory Visit: Payer: Self-pay | Admitting: Gastroenterology

## 2013-03-17 ENCOUNTER — Encounter (HOSPITAL_COMMUNITY): Admission: RE | Payer: Self-pay | Source: Ambulatory Visit

## 2013-03-17 ENCOUNTER — Ambulatory Visit (HOSPITAL_COMMUNITY): Admission: RE | Admit: 2013-03-17 | Payer: Medicare Other | Source: Ambulatory Visit | Admitting: Gastroenterology

## 2013-03-17 SURGERY — MANOMETRY, ESOPHAGUS
Anesthesia: Moderate Sedation

## 2013-03-17 SURGERY — MANOMETRY, ESOPHAGUS
Anesthesia: Topical

## 2013-04-23 ENCOUNTER — Telehealth: Payer: Self-pay

## 2013-04-23 NOTE — Telephone Encounter (Signed)
lmom.received a message that pt is scheduled to have xrays at Dr.Stoneking's office the same day as appt with Dr.Smith.if pt is having a chest xray we can reqst a copy form pcp.if not we would need to send her to GI to have it done.that can be determined at her visit

## 2013-04-24 ENCOUNTER — Other Ambulatory Visit: Payer: Self-pay | Admitting: *Deleted

## 2013-04-24 DIAGNOSIS — I4891 Unspecified atrial fibrillation: Secondary | ICD-10-CM

## 2013-05-01 ENCOUNTER — Telehealth: Payer: Self-pay | Admitting: Interventional Cardiology

## 2013-05-01 NOTE — Telephone Encounter (Signed)
Left message on machine for pt to contact the office.   

## 2013-05-01 NOTE — Telephone Encounter (Signed)
New problem   Pt waiting on call from nurse concerning her chest xray and f/u appt with dr Tamala Julian.

## 2013-05-02 NOTE — Telephone Encounter (Signed)
Left message to call back.   Lamar Laundry, CMA at 04/23/2013 2:56 PM    Status: Signed        lmom.received a message that pt is scheduled to have xrays at Dr.Stoneking's office the same day as appt with Dr.Smith.if pt is having a chest xray we can reqst a copy form pcp.if not we would need to send her to GI to have it done.that can be determined at her visit

## 2013-05-02 NOTE — Telephone Encounter (Signed)
Follow up ° ° ° ° ° ° ° ° ° °Pt returning nurses call °

## 2013-05-06 ENCOUNTER — Ambulatory Visit: Payer: Medicare Other | Admitting: Interventional Cardiology

## 2013-05-06 ENCOUNTER — Other Ambulatory Visit: Payer: Medicare Other

## 2013-05-07 ENCOUNTER — Encounter: Payer: Self-pay | Admitting: Interventional Cardiology

## 2013-05-07 ENCOUNTER — Ambulatory Visit (INDEPENDENT_AMBULATORY_CARE_PROVIDER_SITE_OTHER): Payer: Medicare Other | Admitting: Interventional Cardiology

## 2013-05-07 ENCOUNTER — Ambulatory Visit
Admission: RE | Admit: 2013-05-07 | Discharge: 2013-05-07 | Disposition: A | Discharge status: Home / Self Care | Payer: Medicare Other | Source: Ambulatory Visit | Attending: Interventional Cardiology | Admitting: Interventional Cardiology

## 2013-05-07 ENCOUNTER — Other Ambulatory Visit: Payer: Medicare Other

## 2013-05-07 VITALS — BP 110/68 | HR 68 | Ht 64.5 in | Wt 237.1 lb

## 2013-05-07 DIAGNOSIS — Z5181 Encounter for therapeutic drug level monitoring: Secondary | ICD-10-CM

## 2013-05-07 DIAGNOSIS — Z7901 Long term (current) use of anticoagulants: Secondary | ICD-10-CM | POA: Insufficient documentation

## 2013-05-07 DIAGNOSIS — E119 Type 2 diabetes mellitus without complications: Secondary | ICD-10-CM | POA: Diagnosis not present

## 2013-05-07 DIAGNOSIS — E785 Hyperlipidemia, unspecified: Secondary | ICD-10-CM

## 2013-05-07 DIAGNOSIS — Z79899 Other long term (current) drug therapy: Secondary | ICD-10-CM | POA: Diagnosis not present

## 2013-05-07 DIAGNOSIS — I10 Essential (primary) hypertension: Secondary | ICD-10-CM | POA: Diagnosis not present

## 2013-05-07 DIAGNOSIS — N289 Disorder of kidney and ureter, unspecified: Secondary | ICD-10-CM | POA: Diagnosis not present

## 2013-05-07 DIAGNOSIS — I4891 Unspecified atrial fibrillation: Secondary | ICD-10-CM | POA: Diagnosis not present

## 2013-05-07 DIAGNOSIS — E669 Obesity, unspecified: Secondary | ICD-10-CM | POA: Diagnosis not present

## 2013-05-07 LAB — HEPATIC FUNCTION PANEL
ALT: 29 U/L (ref 0–35)
AST: 22 U/L (ref 0–37)
Albumin: 3.9 g/dL (ref 3.5–5.2)
Alkaline Phosphatase: 121 U/L — ABNORMAL HIGH (ref 39–117)
Bilirubin, Direct: 0.1 mg/dL (ref 0.0–0.3)
Total Bilirubin: 0.8 mg/dL (ref 0.3–1.2)
Total Protein: 7 g/dL (ref 6.0–8.3)

## 2013-05-07 LAB — CREATININE, SERUM: Creatinine, Ser: 1.2 mg/dL (ref 0.4–1.2)

## 2013-05-07 LAB — HEMOGLOBIN: Hemoglobin: 15.9 g/dL — ABNORMAL HIGH (ref 12.0–15.0)

## 2013-05-07 LAB — TSH: TSH: 1.8 u[IU]/mL (ref 0.35–5.50)

## 2013-05-07 NOTE — Patient Instructions (Signed)
Your physician recommends that you continue on your current medications as directed. Please refer to the Current Medication list given to you today.  Labs Today: Tsh, Lft  A chest x-ray takes a picture of the organs and structures inside the chest, including the heart, lungs, and blood vessels. This test can show several things, including, whether the heart is enlarges; whether fluid is building up in the lungs; and whether pacemaker / defibrillator leads are still in place.(To be done at [place])  Your physician wants you to follow-up in: 6 month You will receive a reminder letter in the mail two months in advance. If you don't receive a letter, please call our office to schedule the follow-up appointment.

## 2013-05-07 NOTE — Progress Notes (Signed)
05/07/2013 1:05 PM Past Medical History  type 2 diabetes mellitus- Dr Buddy Duty   Fibromyalgia   Chronic fatigue syndrome   Endometriosis   Hypertension   had a hernia with GERD   ADD   osteoarthritis cervical spine MRI disc bulge right C5-C6 no nerve root impingement   [facility]    opthal Dr Samara Snide   ortho Dr Amedeo Plenty   derm [facility]   dentist Dr Ezzie Dural   chronic fatigue Dr Jeral Fruit   Esophogeal spasm uses nitro   Sleep apnea cpap   afib on 05/2011...cardioversion 2013 to NSR   cards Dr Tamala Julian      [address] [place], [place]  [address] Phone: [phone] Fax:  [phone]  Date:  05/07/2013   ID:  DESSIREE PORTALATIN, DOB 1945-06-22, MRN [id number]  PCP:  Mathews Argyle, MD   ASSESSMENT:  1. Atrial fibrillation, controlled on amiodarone 2. Chronic anticoagulation therapy without bleeding complications 3. Hypertension 4. Chronic amiodarone therapy without complications  PLAN:  1. TSH and liver panel today and in 6 months he'll return 2. No change in therapy unless dictated by data 3. Clinical followup in 6 months 4. Encouraged weight loss and aerobic activity 5. Check hemoglobin and creatinine to monitor Xarelto therapy   SUBJECTIVE: MELYNA ATCHLEY is a 68 y.o. female is doing well. No episodes of atrial fibrillation, bleeding, syncope, or other complaints. There is no edema. She denies palpitations and chest pain. No orthopnea. Exertional tolerance is unchanged.   Wt Readings from Last 3 Encounters:  05/07/13 237 lb 1.9 oz (107.557 kg)  12/03/12 231 lb (104.781 kg)  12/03/12 231 lb (104.781 kg)     Past Medical History  Diagnosis Date  . Diabetes mellitus   . Hypertension   . MVP (mitral valve prolapse)   . Dyslipidemia   . Fibromyalgia   . GERD (gastroesophageal reflux disease)   . Dysrhythmia     hx. A.Fib-Cardioversion-converted rhythm  . Vitamin D deficiency 10-18-12  . OSA (obstructive  sleep apnea) 10-18-12    no cpap used now.  . Complication of anesthesia 1975    projectile vomitting    Current Outpatient Prescriptions  Medication Sig Dispense Refill  . acetaminophen (TYLENOL) 500 MG tablet Take 500 mg by mouth every 6 (six) hours as needed for pain.      [name] amiodarone (PACERONE) 200 MG tablet Take 1 tablet (200 mg total) by mouth daily.  30 tablet  8  . amphetamine-dextroamphetamine (ADDERALL) 20 MG tablet Take 20 mg by mouth 2 (two) times daily.       [name] atorvastatin (LIPITOR) 80 MG tablet Take 80 mg by mouth at bedtime.       . BD PEN NEEDLE NANO U/F 32G X 4 MM MISC       . Canagliflozin (INVOKANA) 100 MG TABS Take 100 mg by mouth daily with breakfast.      . Cholecalciferol (VITAMIN D) 2000 UNITS tablet Take 2,000 Units by mouth daily.      . clindamycin (CLEOCIN) 150 MG capsule       . diphenhydrAMINE (BENADRYL) 25 mg capsule Take 25 mg by mouth every 6 (six) hours as needed for itching or allergies.      . DULoxetine (CYMBALTA) 60 MG capsule Take 60 mg by mouth 2 (two) times daily.      [name] esomeprazole (NEXIUM) 40 MG capsule Take 40 mg by mouth 2 (two) times daily.       [name]  exenatide (BYETTA) 10 MCG/0.04ML SOPN Inject 10 mcg into the skin 2 (two) times daily with a meal.      . fluticasone (FLONASE) 50 MCG/ACT nasal spray Place 2 sprays into the nose daily as needed for allergies.      [name] FREESTYLE LITE test strip       . furosemide (LASIX) 20 MG tablet Take 10 mg by mouth daily as needed for fluid.      [name] insulin glargine (LANTUS) 100 UNIT/ML injection Inject 120 Units into the skin every morning.       [name] LORazepam (ATIVAN) 1 MG tablet Take 1 mg by mouth at bedtime as needed (for sleep).       . metoprolol tartrate (LOPRESSOR) 25 MG tablet Take 25 mg by mouth 2 (two) times daily.      . nitroGLYCERIN (NITROSTAT) 0.4 MG SL tablet Place 0.4 mg under the tongue every 5 (five) minutes as needed. For chest pain      . oxyCODONE (OXY IR/ROXICODONE) 5 MG immediate release  tablet Take 5 mg by mouth every 6 (six) hours as needed (for breakthrough pain).      [name] oxyCODONE (OXYCONTIN) 40 MG 12 hr tablet Take 40 mg by mouth every 8 (eight) hours as needed for pain.       [name] PHARMACIST CHOICE LANCETS MISC       . polyethylene glycol (MIRALAX / GLYCOLAX) packet Take 17 g by mouth daily as needed (for constipation).       . pregabalin (LYRICA) 150 MG capsule Take 150-450 mg by mouth 2 (two) times daily. 1 in the am and 3 in the pm      . Rivaroxaban (XARELTO) 20 MG TABS Take 1 tablet by mouth daily.      . SF 5000 PLUS 1.1 % CREA dental cream       . [DISCONTINUED] estrogens, conjugated, (PREMARIN) 0.9 MG tablet Take 0.9 mg by mouth daily.        . [DISCONTINUED] glimepiride (AMARYL) 4 MG tablet Take 4 mg by mouth 2 (two) times daily.       . [DISCONTINUED] metFORMIN (GLUCOPHAGE) 500 MG tablet Take 1,000 mg by mouth 2 (two) times daily with a meal.         No current facility-administered medications for this visit.    Allergies:    Allergies  Allergen Reactions  . Dabigatran Etexilate Mesylate Other (See Comments)    Headaches   . Morphine And Related     hyperactivity  . Demerol Nausea And Vomiting    Can take with phenrgan  . Iohexol      Desc: EYES & LIPS SWELLING, SOB DURING A MYELOGRAM '88, DECADRON GIVEN/ PRE MEDS REQUIRED/A.C., Onset Date: [id number]   . Penicillins Hives and Swelling    Social History:  The patient  reports that she has never smoked. She does not have any smokeless tobacco history on file. She reports that she does not drink alcohol or use illicit drugs.   ROS:  Please see the history of present illness.   Appetite is stable denies chills and fever. No headache. No peripheral edema. Denies orthopnea.   All other systems reviewed and negative.   OBJECTIVE: VS:  BP 110/68  Pulse 68  Ht 5' 4.5" (1.638 m)  Wt 237 lb 1.9 oz (107.557 kg)  BMI 40.09 kg/m2 Well nourished, well developed, in no acute distress, obese HEENT: normal Neck:  JVD flat. Carotid bruit absent  Cardiac:  normal  S1, S2; RRR; no murmur Lungs:  clear to auscultation bilaterally, no wheezing, rhonchi or rales Abd: soft, nontender, no hepatomegaly Ext: Edema absent. Pulses 2+ and symmetric Skin: warm and dry Neuro:  CNs 2-12 intact, no focal abnormalities noted  EKG:  Normal sinus rhythm with relatively low voltage no acute ST-T wave change       Signed, Illene Labrador III, MD 05/07/2013 1:09 PM

## 2013-05-08 ENCOUNTER — Other Ambulatory Visit: Payer: Medicare Other

## 2013-05-08 ENCOUNTER — Encounter: Payer: Self-pay | Admitting: Interventional Cardiology

## 2013-05-09 ENCOUNTER — Telehealth: Payer: Self-pay

## 2013-05-09 NOTE — Telephone Encounter (Signed)
Message copied by Lamar Laundry on Fri May 09, 2013  2:49 PM ------      Message from: Daneen Schick      Created: Thu May 08, 2013  8:09 AM       Normal liver function ------

## 2013-05-09 NOTE — Telephone Encounter (Signed)
pt aware labs and chest xray were normal.pt verbalized understandiing.

## 2013-05-14 ENCOUNTER — Other Ambulatory Visit: Payer: Self-pay

## 2013-05-14 DIAGNOSIS — Z1231 Encounter for screening mammogram for malignant neoplasm of breast: Secondary | ICD-10-CM

## 2013-05-14 DIAGNOSIS — Z9889 Other specified postprocedural states: Secondary | ICD-10-CM

## 2013-05-14 MED ORDER — AMIODARONE HCL 200 MG PO TABS: 200.0000 mg | ORAL_TABLET | Freq: Every day | ORAL | Status: DC

## 2013-05-14 MED ORDER — RIVAROXABAN 20 MG PO TABS: 20.0000 mg | ORAL_TABLET | Freq: Every day | ORAL | Status: DC

## 2013-05-14 MED ORDER — FUROSEMIDE 20 MG PO TABS: 10.0000 mg | ORAL_TABLET | Freq: Every day | ORAL | Status: DC | PRN

## 2013-05-18 NOTE — H&P (Signed)
Problem: Esophageal dysphagia  History: The patient is a 68 year old female who underwent a normal screening colonoscopy and esophagogastroduodenoscopy in August 2007.  Intermittently, the patient experiences heartburn and esophageal dysphagia unassociated with odynophagia.  She underwent a barium esophagram which showed esophageal spasm and obstruction at the esophagogastric junction.  On 12/03/2012, she underwent a normal esophagogastroduodenoscopy except for the removal of 2 small hyperplastic gastric polyps.  The patient is scheduled to undergo an esophageal manometry to evaluate chronic esophageal dysphagia.  Past medical history: Benign breast biopsy. Bilateral tubal ligation. Hysterectomy. Thyroidectomy. Cataract surgery. Type 2 diabetes mellitus. Fibromyalgia syndrome. Chronic fatigue syndrome. Endometriosis. Hypertension. Osteoarthritis. Obstructive sleep apnea syndrome. Atrial fibrillation.  Medication allergies: Myelogram dye. Avandia. Demerol. Morphine. Penicillin. Victoza.  Plan: Proceed with a diagnostic esophageal manometry to evaluate esophageal dysphagia unassociated with normal esophagogastroduodenoscopy.

## 2013-05-19 ENCOUNTER — Encounter (HOSPITAL_COMMUNITY)
Admission: RE | Disposition: A | Discharge status: Home / Self Care | Payer: Self-pay | Source: Ambulatory Visit | Attending: Gastroenterology

## 2013-05-19 ENCOUNTER — Ambulatory Visit (HOSPITAL_COMMUNITY)
Admission: RE | Admit: 2013-05-19 | Discharge: 2013-05-19 | Disposition: A | Discharge status: Home / Self Care | Payer: Medicare Other | Source: Ambulatory Visit | Attending: Gastroenterology | Admitting: Gastroenterology

## 2013-05-19 DIAGNOSIS — K220 Achalasia of cardia: Secondary | ICD-10-CM | POA: Insufficient documentation

## 2013-05-19 DIAGNOSIS — R5382 Chronic fatigue, unspecified: Secondary | ICD-10-CM | POA: Diagnosis not present

## 2013-05-19 DIAGNOSIS — Z91041 Radiographic dye allergy status: Secondary | ICD-10-CM | POA: Insufficient documentation

## 2013-05-19 DIAGNOSIS — G9332 Myalgic encephalomyelitis/chronic fatigue syndrome: Secondary | ICD-10-CM | POA: Insufficient documentation

## 2013-05-19 DIAGNOSIS — E0789 Other specified disorders of thyroid: Secondary | ICD-10-CM | POA: Insufficient documentation

## 2013-05-19 DIAGNOSIS — IMO0001 Reserved for inherently not codable concepts without codable children: Secondary | ICD-10-CM | POA: Insufficient documentation

## 2013-05-19 DIAGNOSIS — Z880 Allergy status to penicillin: Secondary | ICD-10-CM | POA: Diagnosis not present

## 2013-05-19 DIAGNOSIS — E119 Type 2 diabetes mellitus without complications: Secondary | ICD-10-CM | POA: Diagnosis not present

## 2013-05-19 DIAGNOSIS — I4891 Unspecified atrial fibrillation: Secondary | ICD-10-CM | POA: Insufficient documentation

## 2013-05-19 DIAGNOSIS — I10 Essential (primary) hypertension: Secondary | ICD-10-CM | POA: Diagnosis not present

## 2013-05-19 DIAGNOSIS — Z885 Allergy status to narcotic agent status: Secondary | ICD-10-CM | POA: Diagnosis not present

## 2013-05-19 DIAGNOSIS — G4733 Obstructive sleep apnea (adult) (pediatric): Secondary | ICD-10-CM | POA: Diagnosis not present

## 2013-05-19 HISTORY — PX: ESOPHAGEAL MANOMETRY: SHX5429

## 2013-05-19 SURGERY — MANOMETRY, ESOPHAGUS

## 2013-05-19 MED ORDER — LIDOCAINE VISCOUS 2 % MT SOLN
OROMUCOSAL | Status: AC
  Filled 2013-05-19: qty 15

## 2013-05-19 SURGICAL SUPPLY — 1 items: FACESHIELD LNG OPTICON STERILE (SAFETY) IMPLANT

## 2013-05-20 ENCOUNTER — Encounter (HOSPITAL_COMMUNITY): Payer: Self-pay | Admitting: Gastroenterology

## 2013-05-27 DIAGNOSIS — M545 Low back pain, unspecified: Secondary | ICD-10-CM | POA: Diagnosis not present

## 2013-05-27 DIAGNOSIS — G541 Lumbosacral plexus disorders: Secondary | ICD-10-CM | POA: Diagnosis not present

## 2013-05-27 DIAGNOSIS — M542 Cervicalgia: Secondary | ICD-10-CM | POA: Diagnosis not present

## 2013-05-27 DIAGNOSIS — G608 Other hereditary and idiopathic neuropathies: Secondary | ICD-10-CM | POA: Diagnosis not present

## 2013-05-27 DIAGNOSIS — R42 Dizziness and giddiness: Secondary | ICD-10-CM | POA: Diagnosis not present

## 2013-05-27 DIAGNOSIS — R209 Unspecified disturbances of skin sensation: Secondary | ICD-10-CM | POA: Diagnosis not present

## 2013-05-27 DIAGNOSIS — Z79899 Other long term (current) drug therapy: Secondary | ICD-10-CM | POA: Diagnosis not present

## 2013-05-28 DIAGNOSIS — G5600 Carpal tunnel syndrome, unspecified upper limb: Secondary | ICD-10-CM | POA: Diagnosis not present

## 2013-05-28 DIAGNOSIS — M19049 Primary osteoarthritis, unspecified hand: Secondary | ICD-10-CM | POA: Diagnosis not present

## 2013-06-03 DIAGNOSIS — Z1331 Encounter for screening for depression: Secondary | ICD-10-CM | POA: Diagnosis not present

## 2013-06-03 DIAGNOSIS — E7800 Pure hypercholesterolemia, unspecified: Secondary | ICD-10-CM | POA: Diagnosis not present

## 2013-06-03 DIAGNOSIS — N183 Chronic kidney disease, stage 3 unspecified: Secondary | ICD-10-CM | POA: Diagnosis not present

## 2013-06-03 DIAGNOSIS — R5383 Other fatigue: Secondary | ICD-10-CM | POA: Diagnosis not present

## 2013-06-03 DIAGNOSIS — Z79899 Other long term (current) drug therapy: Secondary | ICD-10-CM | POA: Diagnosis not present

## 2013-06-03 DIAGNOSIS — R5381 Other malaise: Secondary | ICD-10-CM | POA: Diagnosis not present

## 2013-06-03 DIAGNOSIS — Z0000 Encounter for general adult medical examination without abnormal findings: Secondary | ICD-10-CM | POA: Diagnosis not present

## 2013-06-03 DIAGNOSIS — E669 Obesity, unspecified: Secondary | ICD-10-CM | POA: Diagnosis not present

## 2013-06-16 DIAGNOSIS — G5600 Carpal tunnel syndrome, unspecified upper limb: Secondary | ICD-10-CM | POA: Diagnosis not present

## 2013-06-18 DIAGNOSIS — M545 Low back pain, unspecified: Secondary | ICD-10-CM | POA: Diagnosis not present

## 2013-06-18 DIAGNOSIS — M533 Sacrococcygeal disorders, not elsewhere classified: Secondary | ICD-10-CM | POA: Diagnosis not present

## 2013-06-18 DIAGNOSIS — M5030 Other cervical disc degeneration, unspecified cervical region: Secondary | ICD-10-CM | POA: Diagnosis not present

## 2013-06-18 DIAGNOSIS — M5137 Other intervertebral disc degeneration, lumbosacral region: Secondary | ICD-10-CM | POA: Diagnosis not present

## 2013-06-18 DIAGNOSIS — Z79899 Other long term (current) drug therapy: Secondary | ICD-10-CM | POA: Diagnosis not present

## 2013-07-02 ENCOUNTER — Telehealth: Payer: Self-pay | Admitting: Interventional Cardiology

## 2013-07-02 ENCOUNTER — Telehealth: Payer: Self-pay

## 2013-07-02 NOTE — Telephone Encounter (Signed)
Received request from Nurse fax box, documents faxed for surgical clearance. To: [employer] Fax number: [phone] Attention: [id number]/kdm

## 2013-07-02 NOTE — Telephone Encounter (Signed)
Cardiac clearance given to medical records to fax to [facility] [phone] 

## 2013-08-26 DIAGNOSIS — S93609A Unspecified sprain of unspecified foot, initial encounter: Secondary | ICD-10-CM | POA: Diagnosis not present

## 2013-08-26 DIAGNOSIS — M25579 Pain in unspecified ankle and joints of unspecified foot: Secondary | ICD-10-CM | POA: Diagnosis not present

## 2013-10-27 DIAGNOSIS — Z23 Encounter for immunization: Secondary | ICD-10-CM | POA: Diagnosis not present

## 2013-11-10 ENCOUNTER — Ambulatory Visit: Payer: Medicare Other

## 2013-11-20 ENCOUNTER — Encounter: Payer: Self-pay | Admitting: Interventional Cardiology

## 2013-11-20 ENCOUNTER — Ambulatory Visit
Admission: RE | Admit: 2013-11-20 | Discharge: 2013-11-20 | Disposition: A | Discharge status: Home / Self Care | Payer: Medicare Other | Source: Ambulatory Visit

## 2013-11-20 ENCOUNTER — Ambulatory Visit
Admission: RE | Admit: 2013-11-20 | Discharge: 2013-11-20 | Disposition: A | Discharge status: Home / Self Care | Payer: Medicare Other | Source: Ambulatory Visit | Attending: Interventional Cardiology | Admitting: Interventional Cardiology

## 2013-11-20 ENCOUNTER — Ambulatory Visit (INDEPENDENT_AMBULATORY_CARE_PROVIDER_SITE_OTHER): Payer: Medicare Other | Admitting: Interventional Cardiology

## 2013-11-20 ENCOUNTER — Other Ambulatory Visit: Payer: Medicare Other

## 2013-11-20 VITALS — BP 115/82 | HR 63 | Ht 64.5 in | Wt 234.8 lb

## 2013-11-20 DIAGNOSIS — Z79899 Other long term (current) drug therapy: Secondary | ICD-10-CM

## 2013-11-20 DIAGNOSIS — Z9889 Other specified postprocedural states: Secondary | ICD-10-CM

## 2013-11-20 DIAGNOSIS — I10 Essential (primary) hypertension: Secondary | ICD-10-CM

## 2013-11-20 DIAGNOSIS — I4891 Unspecified atrial fibrillation: Secondary | ICD-10-CM | POA: Diagnosis not present

## 2013-11-20 DIAGNOSIS — Z1231 Encounter for screening mammogram for malignant neoplasm of breast: Secondary | ICD-10-CM | POA: Diagnosis not present

## 2013-11-20 DIAGNOSIS — Z5181 Encounter for therapeutic drug level monitoring: Secondary | ICD-10-CM | POA: Diagnosis not present

## 2013-11-20 DIAGNOSIS — Z7901 Long term (current) use of anticoagulants: Secondary | ICD-10-CM

## 2013-11-20 DIAGNOSIS — I480 Paroxysmal atrial fibrillation: Secondary | ICD-10-CM

## 2013-11-20 DIAGNOSIS — J984 Other disorders of lung: Secondary | ICD-10-CM | POA: Diagnosis not present

## 2013-11-20 DIAGNOSIS — G4733 Obstructive sleep apnea (adult) (pediatric): Secondary | ICD-10-CM

## 2013-11-20 LAB — BASIC METABOLIC PANEL
BUN: 14 mg/dL (ref 6–23)
CO2: 32 mEq/L (ref 19–32)
Calcium: 9 mg/dL (ref 8.4–10.5)
Chloride: 100 mEq/L (ref 96–112)
Creatinine, Ser: 1.3 mg/dL — ABNORMAL HIGH (ref 0.4–1.2)
GFR: 42.77 mL/min — ABNORMAL LOW (ref >60.00)
Glucose, Bld: 163 mg/dL — ABNORMAL HIGH (ref 70–99)
Potassium: 4.4 mEq/L (ref 3.5–5.1)
Sodium: 141 mEq/L (ref 135–145)

## 2013-11-20 LAB — HEMOGLOBIN: Hemoglobin: 15.3 g/dL — ABNORMAL HIGH (ref 12.0–15.0)

## 2013-11-20 LAB — HEPATIC FUNCTION PANEL
ALT: 25 U/L (ref 0–35)
AST: 20 U/L (ref 0–37)
Albumin: 3.6 g/dL (ref 3.5–5.2)
Alkaline Phosphatase: 108 U/L (ref 39–117)
Bilirubin, Direct: 0.1 mg/dL (ref 0.0–0.3)
Total Bilirubin: 0.4 mg/dL (ref 0.2–1.2)
Total Protein: 7.2 g/dL (ref 6.0–8.3)

## 2013-11-20 LAB — TSH: TSH: 2.63 u[IU]/mL (ref 0.35–4.50)

## 2013-11-20 MED ORDER — AMIODARONE HCL 200 MG PO TABS: 100.0000 mg | ORAL_TABLET | Freq: Every day | ORAL | Status: DC

## 2013-11-20 MED ORDER — APIXABAN 5 MG PO TABS: 5.0000 mg | ORAL_TABLET | Freq: Two times a day (BID) | ORAL | Status: DC

## 2013-11-20 NOTE — Patient Instructions (Signed)
Your physician has recommended you make the following change in your medication:  1) COMPLETE your supply of Xarelto at home and then STOP 2) START Eliquis 5mg  twice daily. An Rx has been sent to your pharmacy  Lab Today: Bmet, Hemoglobin, Hepatic, Tsh  A chest x-ray takes a picture of the organs and structures inside the chest, including the heart, lungs, and blood vessels. This test can show several things, including, whether the heart is enlarges; whether fluid is building up in the lungs; and whether pacemaker / defibrillator leads are still in place.( To be done at [facility], you can walk in you do not need an appt)  Your physician recommends that you return for lab work  And chest xray in 6 months  Your physician wants you to follow-up in: 6 months with Dr.Smith.You will receive a reminder letter in the mail two months in advance. If you don't receive a letter, please call our office to schedule the follow-up appointment.

## 2013-11-20 NOTE — Progress Notes (Signed)
Patient ID: [name], female   DOB: 15-Jan-1946, 68 y.o.   MRN: [id number]    [address] [place], [place]  [address] Phone: [phone] Fax:  [phone]  Date:  11/20/2013   ID:  Camile, Esters [birth year], MRN [id number]  PCP:  Mathews Argyle, MD   ASSESSMENT:  1. Paroxysmal atrial fibrillation, maintaining normal sinus rhythm after cardioversion and on amiodarone therapy 2. Chronic anticoagulation now switching from Xarelto to Eliquis 3. Borderline blood pressure 4. Amiodarone therapy  PLAN:  1. Decrease amiodarone to 100 mg daily 2. Switch is Xarelto to Eliquis. Check CBC and BE not today 3. TSH, ALT, and p.m. lateral chest x-ray today 4. Clinical followup in 6 months with TSH, and ALT   SUBJECTIVE: [name] is a 68 y.o. female who is doing well. She has not had fatigue or palpitations to suggest recurrent A. fib. She denies bleeding. Again all this visit she is concerned about being on Xarelto because of what she is red and would prefer another agent. There is no peripheral edema, syncope, chest pain, or neurological complaints.   Wt Readings from Last 3 Encounters:  11/20/13 234 lb 12.8 oz (106.505 kg)  05/07/13 237 lb 1.9 oz (107.557 kg)  12/03/12 231 lb (104.781 kg)     Past Medical History  Diagnosis Date  . Diabetes mellitus   . Hypertension   . MVP (mitral valve prolapse)   . Dyslipidemia   . Fibromyalgia   . GERD (gastroesophageal reflux disease)   . Dysrhythmia     hx. A.Fib-Cardioversion-converted rhythm  . Vitamin D deficiency 10-18-12  . OSA (obstructive sleep apnea) 10-18-12    no cpap used now.  . Complication of anesthesia 1975    projectile vomitting    Current Outpatient Prescriptions  Medication Sig Dispense Refill  . acetaminophen (TYLENOL) 500 MG tablet Take 500 mg by mouth every 6 (six) hours as needed for pain.      [name] amiodarone (PACERONE) 200 MG tablet Take 1 tablet (200 mg total) by mouth daily.   90 tablet  3  . amphetamine-dextroamphetamine (ADDERALL) 20 MG tablet Take 20 mg by mouth 2 (two) times daily.       [name] atorvastatin (LIPITOR) 80 MG tablet Take 80 mg by mouth at bedtime.       . BD PEN NEEDLE NANO U/F 32G X 4 MM MISC       . Canagliflozin (INVOKANA) 100 MG TABS Take 100 mg by mouth daily with breakfast.      . Cholecalciferol (VITAMIN D) 2000 UNITS tablet Take 2,000 Units by mouth daily.      . DULoxetine (CYMBALTA) 60 MG capsule Take 60 mg by mouth 2 (two) times daily.      [name] esomeprazole (NEXIUM) 40 MG capsule Take 40 mg by mouth 2 (two) times daily.       [name] exenatide (BYETTA) 10 MCG/0.04ML SOPN Inject 10 mcg into the skin 2 (two) times daily with a meal.      . fluticasone (FLONASE) 50 MCG/ACT nasal spray Place 2 sprays into the nose daily as needed for allergies.      . furosemide (LASIX) 20 MG tablet Take 0.5 tablets (10 mg total) by mouth daily as needed for fluid.  90 tablet  3  . insulin glargine (LANTUS) 100 UNIT/ML injection Inject 120 Units into the skin every morning.       [name] LORazepam (ATIVAN) 1 MG tablet Take  1 mg by mouth at bedtime as needed (for sleep).       . metoprolol tartrate (LOPRESSOR) 25 MG tablet Take 25 mg by mouth 2 (two) times daily.      . nitroGLYCERIN (NITROSTAT) 0.4 MG SL tablet Place 0.4 mg under the tongue every 5 (five) minutes as needed. For chest pain      . oxyCODONE (OXY IR/ROXICODONE) 5 MG immediate release tablet Take 5 mg by mouth every 6 (six) hours as needed (for breakthrough pain).      [name] oxyCODONE (OXYCONTIN) 40 MG 12 hr tablet Take 40 mg by mouth every 8 (eight) hours as needed for pain.       . polyethylene glycol (MIRALAX / GLYCOLAX) packet Take 17 g by mouth daily as needed (for constipation).       . pregabalin (LYRICA) 150 MG capsule Take 150-450 mg by mouth 2 (two) times daily. 1 in the am and 3 in the pm      . Rivaroxaban (XARELTO) 20 MG TABS tablet Take 1 tablet (20 mg total) by mouth daily.  90 tablet  3  . clindamycin  (CLEOCIN) 150 MG capsule       . diphenhydrAMINE (BENADRYL) 25 mg capsule Take 25 mg by mouth every 6 (six) hours as needed for itching or allergies.      [name] FREESTYLE LITE test strip       . PHARMACIST CHOICE LANCETS MISC       . SF 5000 PLUS 1.1 % CREA dental cream       . [DISCONTINUED] estrogens, conjugated, (PREMARIN) 0.9 MG tablet Take 0.9 mg by mouth daily.        . [DISCONTINUED] glimepiride (AMARYL) 4 MG tablet Take 4 mg by mouth 2 (two) times daily.       . [DISCONTINUED] metFORMIN (GLUCOPHAGE) 500 MG tablet Take 1,000 mg by mouth 2 (two) times daily with a meal.         No current facility-administered medications for this visit.    Allergies:    Allergies  Allergen Reactions  . Dabigatran Etexilate Mesylate Other (See Comments)    Headaches   . Morphine And Related     hyperactivity  . Demerol Nausea And Vomiting    Can take with phenrgan  . Iohexol      Desc: EYES & LIPS SWELLING, SOB DURING A MYELOGRAM '88, DECADRON GIVEN/ PRE MEDS REQUIRED/A.C., Onset Date: [id number]   . Penicillins Hives and Swelling    Social History:  The patient  reports that she has never smoked. She does not have any smokeless tobacco history on file. She reports that she does not drink alcohol or use illicit drugs.   ROS:  Please see the history of present illness.   Denies claudication. Has not had syncope no orthopnea, PND, or edema. Appetite is been stable. No melena or hematuria.   All other systems reviewed and negative.   OBJECTIVE: VS:  BP 115/82  Pulse 63  Ht 5' 4.5" (1.638 m)  Wt 234 lb 12.8 oz (106.505 kg)  BMI 39.70 kg/m2 Well nourished, well developed, in no acute distress, obese HEENT: normal Neck: JVD flat. Carotid bruit absent  Cardiac:  normal S1, S2; RRR; no murmur Lungs:  clear to auscultation bilaterally, no wheezing, rhonchi or rales Abd: soft, nontender, no hepatomegaly Ext: Edema absent. Pulses 2+ Skin: warm and dry Neuro:  CNs 2-12 intact, no focal abnormalities  noted  EKG:  Not done today.  Signed, Illene Labrador III, MD 11/20/2013 2:06 PM

## 2013-11-24 ENCOUNTER — Other Ambulatory Visit: Payer: Self-pay | Admitting: *Deleted

## 2013-11-24 DIAGNOSIS — I480 Paroxysmal atrial fibrillation: Secondary | ICD-10-CM

## 2013-11-24 MED ORDER — APIXABAN 5 MG PO TABS: 5.0000 mg | ORAL_TABLET | Freq: Two times a day (BID) | ORAL | Status: DC

## 2013-12-04 ENCOUNTER — Other Ambulatory Visit: Payer: Self-pay | Admitting: Geriatric Medicine

## 2013-12-04 DIAGNOSIS — IMO0001 Reserved for inherently not codable concepts without codable children: Secondary | ICD-10-CM | POA: Diagnosis not present

## 2013-12-04 DIAGNOSIS — D499 Neoplasm of unspecified behavior of unspecified site: Secondary | ICD-10-CM

## 2013-12-04 DIAGNOSIS — K220 Achalasia of cardia: Secondary | ICD-10-CM | POA: Diagnosis not present

## 2013-12-04 DIAGNOSIS — E7800 Pure hypercholesterolemia, unspecified: Secondary | ICD-10-CM | POA: Diagnosis not present

## 2013-12-04 DIAGNOSIS — Z79899 Other long term (current) drug therapy: Secondary | ICD-10-CM | POA: Diagnosis not present

## 2013-12-04 DIAGNOSIS — Z6841 Body Mass Index (BMI) 40.0 and over, adult: Secondary | ICD-10-CM | POA: Diagnosis not present

## 2013-12-04 DIAGNOSIS — G253 Myoclonus: Secondary | ICD-10-CM | POA: Diagnosis not present

## 2013-12-30 ENCOUNTER — Other Ambulatory Visit: Payer: Medicare Other

## 2014-01-27 DIAGNOSIS — K220 Achalasia of cardia: Secondary | ICD-10-CM | POA: Insufficient documentation

## 2014-03-12 DIAGNOSIS — H26492 Other secondary cataract, left eye: Secondary | ICD-10-CM | POA: Diagnosis not present

## 2014-03-12 DIAGNOSIS — H2511 Age-related nuclear cataract, right eye: Secondary | ICD-10-CM | POA: Diagnosis not present

## 2014-03-12 DIAGNOSIS — E119 Type 2 diabetes mellitus without complications: Secondary | ICD-10-CM | POA: Diagnosis not present

## 2014-03-12 DIAGNOSIS — Z794 Long term (current) use of insulin: Secondary | ICD-10-CM | POA: Diagnosis not present

## 2014-03-26 ENCOUNTER — Other Ambulatory Visit: Payer: Self-pay

## 2014-03-26 DIAGNOSIS — I480 Paroxysmal atrial fibrillation: Secondary | ICD-10-CM

## 2014-03-26 MED ORDER — AMIODARONE HCL 200 MG PO TABS: 100.0000 mg | ORAL_TABLET | Freq: Every day | ORAL | Status: DC

## 2014-05-13 ENCOUNTER — Other Ambulatory Visit: Payer: Self-pay

## 2014-05-13 DIAGNOSIS — I480 Paroxysmal atrial fibrillation: Secondary | ICD-10-CM

## 2014-05-13 MED ORDER — AMIODARONE HCL 200 MG PO TABS: 100.0000 mg | ORAL_TABLET | Freq: Every day | ORAL | Status: DC

## 2014-06-08 ENCOUNTER — Ambulatory Visit: Payer: Medicare Other | Admitting: Interventional Cardiology

## 2014-06-08 ENCOUNTER — Other Ambulatory Visit: Payer: Medicare Other

## 2014-06-29 ENCOUNTER — Other Ambulatory Visit: Payer: Self-pay | Admitting: *Deleted

## 2014-06-29 MED ORDER — FUROSEMIDE 20 MG PO TABS: 10.0000 mg | ORAL_TABLET | Freq: Every day | ORAL | Status: DC | PRN

## 2014-07-20 ENCOUNTER — Other Ambulatory Visit (INDEPENDENT_AMBULATORY_CARE_PROVIDER_SITE_OTHER): Payer: Medicare Other

## 2014-07-20 ENCOUNTER — Encounter: Payer: Self-pay | Admitting: Interventional Cardiology

## 2014-07-20 ENCOUNTER — Ambulatory Visit
Admission: RE | Admit: 2014-07-20 | Discharge: 2014-07-20 | Disposition: A | Discharge status: Home / Self Care | Payer: Medicare Other | Source: Ambulatory Visit | Attending: Interventional Cardiology | Admitting: Interventional Cardiology

## 2014-07-20 ENCOUNTER — Other Ambulatory Visit: Payer: Medicare Other

## 2014-07-20 ENCOUNTER — Ambulatory Visit (INDEPENDENT_AMBULATORY_CARE_PROVIDER_SITE_OTHER): Payer: Medicare Other | Admitting: Interventional Cardiology

## 2014-07-20 ENCOUNTER — Ambulatory Visit: Payer: Medicare Other | Admitting: Interventional Cardiology

## 2014-07-20 VITALS — BP 102/64 | HR 58 | Ht 64.5 in | Wt 221.8 lb

## 2014-07-20 DIAGNOSIS — Z79899 Other long term (current) drug therapy: Secondary | ICD-10-CM | POA: Diagnosis not present

## 2014-07-20 DIAGNOSIS — I10 Essential (primary) hypertension: Secondary | ICD-10-CM

## 2014-07-20 DIAGNOSIS — I482 Chronic atrial fibrillation, unspecified: Secondary | ICD-10-CM

## 2014-07-20 DIAGNOSIS — I480 Paroxysmal atrial fibrillation: Secondary | ICD-10-CM

## 2014-07-20 DIAGNOSIS — I059 Rheumatic mitral valve disease, unspecified: Secondary | ICD-10-CM

## 2014-07-20 DIAGNOSIS — I4891 Unspecified atrial fibrillation: Secondary | ICD-10-CM | POA: Diagnosis not present

## 2014-07-20 LAB — HEPATIC FUNCTION PANEL
ALT: 18 U/L (ref 0–35)
AST: 17 U/L (ref 0–37)
Albumin: 3.7 g/dL (ref 3.5–5.2)
Alkaline Phosphatase: 88 U/L (ref 39–117)
Bilirubin, Direct: 0.1 mg/dL (ref 0.0–0.3)
Total Bilirubin: 0.4 mg/dL (ref 0.2–1.2)
Total Protein: 6.5 g/dL (ref 6.0–8.3)

## 2014-07-20 LAB — TSH: TSH: 3.73 u[IU]/mL (ref 0.35–4.50)

## 2014-07-20 MED ORDER — AMIODARONE HCL 200 MG PO TABS: 100.0000 mg | ORAL_TABLET | Freq: Every day | ORAL | Status: DC

## 2014-07-20 MED ORDER — APIXABAN 5 MG PO TABS: 5.0000 mg | ORAL_TABLET | Freq: Two times a day (BID) | ORAL | Status: DC

## 2014-07-20 NOTE — Patient Instructions (Signed)
Your physician has recommended you make the following change in your medication:  1) Make sure you are taking Amiodarone 100 mg daily  (1/2 tablet)  Amiodarone surveillance labs today: TSH, HFP  A chest x-ray takes a picture of the organs and structures inside the chest, including the heart, lungs, and blood vessels. This test can show several things, including, whether the heart is enlarges; whether fluid is building up in the lungs; and whether pacemaker / defibrillator leads are still in place.  Your physician wants you to follow-up in: 6 months with Dr. Tamala Julian along with repeat surveillance labs.  You will receive a reminder letter in the mail two months in advance. If you don't receive a letter, please call our office to schedule the follow-up appointment.

## 2014-07-20 NOTE — Progress Notes (Signed)
Cardiology Office Note   Date:  07/20/2014   ID:  Allison, Nichols 1945-07-25, MRN [id number]  PCP:  Mathews Argyle, MD  Cardiologist:   Allison Grooms, MD   No chief complaint on file.     History of Present Illness: [name] is a 69 y.o. female who presents for atrial fibrillation. She is on amiodarone. She indicates that her tablets of 400 mg and she has been using one half tablet alternated with a whole tablet daily. This pattern of therapy has been ongoing since amiodarone was started. She denies dyspnea. She overall feels well.  She has not had lower extremity swelling, syncope, or other complaints.    Past Medical History  Diagnosis Date  . Diabetes mellitus   . Hypertension   . MVP (mitral valve prolapse)   . Dyslipidemia   . Fibromyalgia   . GERD (gastroesophageal reflux disease)   . Dysrhythmia     hx. A.Fib-Cardioversion-converted rhythm  . Vitamin D deficiency 10-18-12  . OSA (obstructive sleep apnea) 10-18-12    no cpap used now.  . Complication of anesthesia 1975    projectile vomitting    Past Surgical History  Procedure Laterality Date  . Cardioversion  07/28/2011    Procedure: CARDIOVERSION;  Surgeon: Allison Grooms, MD;  Location: [facility];  Service: Cardiovascular;  Laterality: N/A;  . Abdominal hysterectomy      '95  . Tubal ligation    . Cataract extraction Left 10-18-12    10'13  . Thyroidectomy  1981  . Esophagogastroduodenoscopy (egd) with propofol N/A 12/03/2012    Procedure: ESOPHAGOGASTRODUODENOSCOPY (EGD) WITH Dilation & w/PROPOFOL;  Surgeon: Allison Fair, MD;  Location: [facility];  Service: Endoscopy;  Laterality: N/A;  . Balloon dilation N/A 12/03/2012    Procedure: BALLOON DILATION;  Surgeon: Allison Fair, MD;  Location: [facility];  Service: Endoscopy;  Laterality: N/A;  . Esophageal manometry N/A 05/19/2013    Procedure: ESOPHAGEAL MANOMETRY (EM);  Surgeon: Allison Fair, MD;  Location: [facility];   Service: Endoscopy;  Laterality: N/A;     Current Outpatient Prescriptions  Medication Sig Dispense Refill  . acetaminophen (TYLENOL) 500 MG tablet Take 500 mg by mouth every 6 (six) hours as needed for pain.    [name] amiodarone (PACERONE) 200 MG tablet Take 0.5 tablets (100 mg total) by mouth daily. 30 tablet 6  . amphetamine-dextroamphetamine (ADDERALL) 20 MG tablet Take 20 mg by mouth 2 (two) times daily.     [name] apixaban (ELIQUIS) 5 MG TABS tablet Take 1 tablet (5 mg total) by mouth 2 (two) times daily. 180 tablet 2  . atorvastatin (LIPITOR) 80 MG tablet Take 80 mg by mouth at bedtime.     . BD PEN NEEDLE NANO U/F 32G X 4 MM MISC     . Canagliflozin (INVOKANA) 100 MG TABS Take 100 mg by mouth daily with breakfast.    . Cholecalciferol (VITAMIN D) 2000 UNITS tablet Take 2,000 Units by mouth daily.    . DULoxetine (CYMBALTA) 60 MG capsule Take 60 mg by mouth 2 (two) times daily.    [name] esomeprazole (NEXIUM) 40 MG capsule Take 40 mg by mouth 2 (two) times daily.     [name] exenatide (BYETTA) 10 MCG/0.04ML SOPN Inject 10 mcg into the skin 2 (two) times daily with a meal.    . furosemide (LASIX) 20 MG tablet Take 0.5 tablets (10 mg total) by mouth daily as needed for fluid. [place]  tablet 0  . insulin glargine (LANTUS) 100 UNIT/ML injection Inject 120 Units into the skin every morning.     [name] LORazepam (ATIVAN) 1 MG tablet Take 1 mg by mouth at bedtime as needed (for sleep).     . metoprolol tartrate (LOPRESSOR) 25 MG tablet Take 25 mg by mouth 2 (two) times daily.    . nitroGLYCERIN (NITROSTAT) 0.4 MG SL tablet Place 0.4 mg under the tongue every 5 (five) minutes as needed. For chest pain    . oxyCODONE (OXY IR/ROXICODONE) 5 MG immediate release tablet Take 5 mg by mouth every 6 (six) hours as needed (for breakthrough pain).    [name] oxyCODONE (OXYCONTIN) 40 MG 12 hr tablet Take 40 mg by mouth every 8 (eight) hours as needed for pain.     . polyethylene glycol (MIRALAX / GLYCOLAX) packet Take 17 g by mouth daily as  needed (for constipation).     . pregabalin (LYRICA) 150 MG capsule Take 150-450 mg by mouth 2 (two) times daily. 1 in the am and 3 in the pm    . [DISCONTINUED] estrogens, conjugated, (PREMARIN) 0.9 MG tablet Take 0.9 mg by mouth daily.      . [DISCONTINUED] glimepiride (AMARYL) 4 MG tablet Take 4 mg by mouth 2 (two) times daily.     . [DISCONTINUED] metFORMIN (GLUCOPHAGE) 500 MG tablet Take 1,000 mg by mouth 2 (two) times daily with a meal.       No current facility-administered medications for this visit.    Allergies:   Dabigatran etexilate mesylate; Iohexol; Morphine and related; Penicillins; and Demerol    Social History:  The patient  reports that she has never smoked. She does not have any smokeless tobacco history on file. She reports that she does not drink alcohol or use illicit drugs.   Family History:  The patient's family history includes Atrial fibrillation in her father; Bronchitis in her father; Diabetes type II in her father; Heart disease in her father; Hypertension in her mother; Hyperthyroidism in her mother; Kidney cancer in her sister.    ROS:  Please see the history of present illness.   Otherwise, review of systems are positive for achalasia with dysphagia and regurgitation. There is also constipation, occasional nocturnal chest discomfort. She does not have exertional chest discomfort. There is fatigue and occasional muscle aches..   All other systems are reviewed and negative.    PHYSICAL EXAM: VS:  BP 102/64 mmHg  Pulse 58  Ht 5' 4.5" (1.638 m)  Wt 221 lb 12.8 oz (100.608 kg)  BMI 37.50 kg/m2 , BMI Body mass index is 37.5 kg/(m^2). GEN: Well nourished, well developed, in no acute distress HEENT: normal Neck: no JVD, carotid bruits, or masses Cardiac: RRR; no murmurs, rubs, or gallops,no edema  Respiratory:  clear to auscultation bilaterally, normal work of breathing GI: soft, nontender, nondistended, + BS MS: no deformity or atrophy Skin: warm and dry, no  rash Neuro:  Strength and sensation are intact Psych: euthymic mood, full affect   EKG:  EKG is ordered today. The ekg ordered today demonstrates sinus bradycardia and mild first degree AV block   Recent Labs: 11/20/2013: ALT 25; BUN 14; Creatinine 1.3*; Hemoglobin 15.3*; Potassium 4.4; Sodium 141; TSH 2.63    Lipid Panel No results found for: CHOL, TRIG, HDL, CHOLHDL, VLDL, LDLCALC, LDLDIRECT    Wt Readings from Last 3 Encounters:  07/20/14 221 lb 12.8 oz (100.608 kg)  11/20/13 234 lb 12.8 oz (106.505 kg)  05/07/13  237 lb 1.9 oz (107.557 kg)      Other studies Reviewed: Additional studies/ records that were reviewed today include: None.   ASSESSMENT AND PLAN:  Paroxysmal atrial fibrillation: No clinical recurrence of atrial fibrillation  Essential hypertension: Controlled  Mitral valve disorder: No auscultatory evidence of trouble  On amiodarone therapy: Accidentally taking 300 mg per day and recommendation has been for 100 mg per day. We will change her tablet size to 200 mg and she will be instructed to take one half tablet per day.     Current medicines are reviewed at length with the patient today.  The patient has concerns regarding medicines.  The following changes have been made:  Meter clear to me that she notices her tablets of 400 mg size area on her own, without letting us know, she takes one half tablet one day and a whole tablet the next. Stasis she has been afraid to decrease to one half tablet per day because of possible recurrence of atrial fibrillation.  Labs/ tests ordered today include: His chronic amiodarone surveillance   Orders Placed This Encounter  Procedures  . DG Chest 2 View  . TSH  . Hepatic function panel  . EKG 12-Lead     Disposition:   FU with Linard Millers in 6 months    Signed, Allison Grooms, MD  07/20/2014 10:43 AM    [facility] [place], [place], [place]  [address] Phone: [phone]; Fax:  [phone]

## 2014-07-22 ENCOUNTER — Telehealth: Payer: Self-pay | Admitting: Interventional Cardiology

## 2014-07-22 NOTE — Telephone Encounter (Signed)
New message     Pt was seen Monday.  She gave incorrect info on Monday.  Patient told Dr Tamala Julian that she takes 400mg  of amiodarone--1/2 tablet one day and 1 tablet the next day.  That was incorrect.  She actually was taking 200mg --1/2 tablet one day and 1 tablet the next day.   Yesterday she started taking 200mg  of amiodarone---1/2 tablet daily.  Please call to let her know she is now taking correct dosage.

## 2014-07-22 NOTE — Telephone Encounter (Signed)
Patient just wanted to make sure Dr Tamala Julian knew that she was incorrect in information she relayed to him at office visit.  She is on the Amiodarone 200 mg 1/2 tablet daily as advised at visit Advised patient would forward to Dr Tamala Julian so he will be aware

## 2014-07-23 ENCOUNTER — Telehealth: Payer: Self-pay

## 2014-07-23 ENCOUNTER — Other Ambulatory Visit: Payer: Self-pay

## 2014-07-23 DIAGNOSIS — Z1231 Encounter for screening mammogram for malignant neoplasm of breast: Secondary | ICD-10-CM

## 2014-07-23 NOTE — Telephone Encounter (Signed)
-----   Message from Belva Crome, MD sent at 07/21/2014  4:30 PM EDT ----- The thyroid and liver are normal.

## 2014-07-23 NOTE — Telephone Encounter (Signed)
Pt aware of lab and cxr results. Cxr-No active disease is noted Lab-The thyroid and liver are normal. Pt verbalized understanding.

## 2014-07-24 HISTORY — PX: LAPAROSCOPIC CHOLECYSTECTOMY: SUR755

## 2014-07-24 HISTORY — PX: CHOLECYSTECTOMY: SHX55

## 2014-07-30 ENCOUNTER — Other Ambulatory Visit: Payer: Medicare Other

## 2014-08-04 DIAGNOSIS — G4733 Obstructive sleep apnea (adult) (pediatric): Secondary | ICD-10-CM | POA: Diagnosis not present

## 2014-08-04 DIAGNOSIS — Z79899 Other long term (current) drug therapy: Secondary | ICD-10-CM | POA: Diagnosis not present

## 2014-08-04 DIAGNOSIS — Z87891 Personal history of nicotine dependence: Secondary | ICD-10-CM | POA: Diagnosis not present

## 2014-08-04 DIAGNOSIS — M797 Fibromyalgia: Secondary | ICD-10-CM | POA: Diagnosis not present

## 2014-08-04 DIAGNOSIS — N189 Chronic kidney disease, unspecified: Secondary | ICD-10-CM | POA: Diagnosis not present

## 2014-08-04 DIAGNOSIS — G894 Chronic pain syndrome: Secondary | ICD-10-CM | POA: Diagnosis not present

## 2014-08-04 DIAGNOSIS — Z9851 Tubal ligation status: Secondary | ICD-10-CM | POA: Diagnosis not present

## 2014-08-04 DIAGNOSIS — Z880 Allergy status to penicillin: Secondary | ICD-10-CM | POA: Diagnosis not present

## 2014-08-04 DIAGNOSIS — R1013 Epigastric pain: Secondary | ICD-10-CM | POA: Diagnosis not present

## 2014-08-04 DIAGNOSIS — K8020 Calculus of gallbladder without cholecystitis without obstruction: Secondary | ICD-10-CM | POA: Diagnosis not present

## 2014-08-04 DIAGNOSIS — Z7901 Long term (current) use of anticoagulants: Secondary | ICD-10-CM | POA: Diagnosis not present

## 2014-08-04 DIAGNOSIS — E785 Hyperlipidemia, unspecified: Secondary | ICD-10-CM | POA: Diagnosis present

## 2014-08-04 DIAGNOSIS — K8000 Calculus of gallbladder with acute cholecystitis without obstruction: Secondary | ICD-10-CM | POA: Diagnosis not present

## 2014-08-04 DIAGNOSIS — R5382 Chronic fatigue, unspecified: Secondary | ICD-10-CM | POA: Diagnosis present

## 2014-08-04 DIAGNOSIS — T402X5A Adverse effect of other opioids, initial encounter: Secondary | ICD-10-CM | POA: Diagnosis not present

## 2014-08-04 DIAGNOSIS — Z6837 Body mass index (BMI) 37.0-37.9, adult: Secondary | ICD-10-CM | POA: Diagnosis not present

## 2014-08-04 DIAGNOSIS — I4891 Unspecified atrial fibrillation: Secondary | ICD-10-CM | POA: Diagnosis not present

## 2014-08-04 DIAGNOSIS — K449 Diaphragmatic hernia without obstruction or gangrene: Secondary | ICD-10-CM | POA: Diagnosis present

## 2014-08-04 DIAGNOSIS — Z794 Long term (current) use of insulin: Secondary | ICD-10-CM | POA: Diagnosis not present

## 2014-08-04 DIAGNOSIS — K819 Cholecystitis, unspecified: Secondary | ICD-10-CM | POA: Diagnosis not present

## 2014-08-04 DIAGNOSIS — K810 Acute cholecystitis: Secondary | ICD-10-CM | POA: Diagnosis not present

## 2014-08-04 DIAGNOSIS — Z885 Allergy status to narcotic agent status: Secondary | ICD-10-CM | POA: Diagnosis not present

## 2014-08-04 DIAGNOSIS — I129 Hypertensive chronic kidney disease with stage 1 through stage 4 chronic kidney disease, or unspecified chronic kidney disease: Secondary | ICD-10-CM | POA: Diagnosis present

## 2014-08-04 DIAGNOSIS — K8012 Calculus of gallbladder with acute and chronic cholecystitis without obstruction: Secondary | ICD-10-CM | POA: Diagnosis not present

## 2014-08-04 DIAGNOSIS — E119 Type 2 diabetes mellitus without complications: Secondary | ICD-10-CM | POA: Diagnosis not present

## 2014-08-04 DIAGNOSIS — E118 Type 2 diabetes mellitus with unspecified complications: Secondary | ICD-10-CM | POA: Diagnosis not present

## 2014-08-04 DIAGNOSIS — F329 Major depressive disorder, single episode, unspecified: Secondary | ICD-10-CM | POA: Diagnosis present

## 2014-08-04 DIAGNOSIS — Z79891 Long term (current) use of opiate analgesic: Secondary | ICD-10-CM | POA: Diagnosis not present

## 2014-08-04 DIAGNOSIS — F419 Anxiety disorder, unspecified: Secondary | ICD-10-CM | POA: Diagnosis not present

## 2014-08-04 DIAGNOSIS — R197 Diarrhea, unspecified: Secondary | ICD-10-CM | POA: Diagnosis not present

## 2014-08-04 DIAGNOSIS — Z90710 Acquired absence of both cervix and uterus: Secondary | ICD-10-CM | POA: Diagnosis not present

## 2014-08-04 DIAGNOSIS — K5900 Constipation, unspecified: Secondary | ICD-10-CM | POA: Diagnosis not present

## 2014-08-04 DIAGNOSIS — I4892 Unspecified atrial flutter: Secondary | ICD-10-CM | POA: Diagnosis present

## 2014-08-04 DIAGNOSIS — N183 Chronic kidney disease, stage 3 (moderate): Secondary | ICD-10-CM | POA: Diagnosis present

## 2014-08-04 DIAGNOSIS — M47892 Other spondylosis, cervical region: Secondary | ICD-10-CM | POA: Diagnosis present

## 2014-08-31 ENCOUNTER — Other Ambulatory Visit: Payer: Medicare Other

## 2014-08-31 DIAGNOSIS — E119 Type 2 diabetes mellitus without complications: Secondary | ICD-10-CM | POA: Diagnosis not present

## 2014-08-31 DIAGNOSIS — Z794 Long term (current) use of insulin: Secondary | ICD-10-CM | POA: Diagnosis not present

## 2014-10-07 ENCOUNTER — Inpatient Hospital Stay: Admission: RE | Admit: 2014-10-07 | Payer: Medicare Other | Source: Ambulatory Visit

## 2014-10-13 DIAGNOSIS — Z780 Asymptomatic menopausal state: Secondary | ICD-10-CM | POA: Diagnosis not present

## 2014-10-13 DIAGNOSIS — E1121 Type 2 diabetes mellitus with diabetic nephropathy: Secondary | ICD-10-CM | POA: Diagnosis not present

## 2014-10-13 DIAGNOSIS — E780 Pure hypercholesterolemia: Secondary | ICD-10-CM | POA: Diagnosis not present

## 2014-10-13 DIAGNOSIS — Z794 Long term (current) use of insulin: Secondary | ICD-10-CM | POA: Diagnosis not present

## 2014-10-13 DIAGNOSIS — N183 Chronic kidney disease, stage 3 (moderate): Secondary | ICD-10-CM | POA: Diagnosis not present

## 2014-10-13 DIAGNOSIS — Z0000 Encounter for general adult medical examination without abnormal findings: Secondary | ICD-10-CM | POA: Diagnosis not present

## 2014-10-13 DIAGNOSIS — Z1389 Encounter for screening for other disorder: Secondary | ICD-10-CM | POA: Diagnosis not present

## 2014-10-13 DIAGNOSIS — F325 Major depressive disorder, single episode, in full remission: Secondary | ICD-10-CM | POA: Diagnosis not present

## 2014-10-13 DIAGNOSIS — Z79899 Other long term (current) drug therapy: Secondary | ICD-10-CM | POA: Diagnosis not present

## 2014-10-13 DIAGNOSIS — M797 Fibromyalgia: Secondary | ICD-10-CM | POA: Diagnosis not present

## 2014-10-30 DIAGNOSIS — L738 Other specified follicular disorders: Secondary | ICD-10-CM | POA: Diagnosis not present

## 2014-10-30 DIAGNOSIS — Z419 Encounter for procedure for purposes other than remedying health state, unspecified: Secondary | ICD-10-CM | POA: Diagnosis not present

## 2014-10-30 DIAGNOSIS — I788 Other diseases of capillaries: Secondary | ICD-10-CM | POA: Diagnosis not present

## 2014-10-30 DIAGNOSIS — D485 Neoplasm of uncertain behavior of skin: Secondary | ICD-10-CM | POA: Diagnosis not present

## 2014-10-30 DIAGNOSIS — D2262 Melanocytic nevi of left upper limb, including shoulder: Secondary | ICD-10-CM | POA: Diagnosis not present

## 2014-10-30 DIAGNOSIS — D2239 Melanocytic nevi of other parts of face: Secondary | ICD-10-CM | POA: Diagnosis not present

## 2014-10-30 DIAGNOSIS — D225 Melanocytic nevi of trunk: Secondary | ICD-10-CM | POA: Diagnosis not present

## 2014-10-30 DIAGNOSIS — L400 Psoriasis vulgaris: Secondary | ICD-10-CM | POA: Diagnosis not present

## 2014-10-30 DIAGNOSIS — L821 Other seborrheic keratosis: Secondary | ICD-10-CM | POA: Diagnosis not present

## 2014-11-24 ENCOUNTER — Ambulatory Visit: Payer: Medicare Other

## 2015-01-19 ENCOUNTER — Ambulatory Visit
Admission: RE | Admit: 2015-01-19 | Discharge: 2015-01-19 | Disposition: A | Discharge status: Home / Self Care | Payer: Medicare Other | Source: Ambulatory Visit

## 2015-01-19 DIAGNOSIS — Z1231 Encounter for screening mammogram for malignant neoplasm of breast: Secondary | ICD-10-CM

## 2015-01-25 ENCOUNTER — Other Ambulatory Visit: Payer: Self-pay

## 2015-01-26 ENCOUNTER — Ambulatory Visit: Payer: Medicare Other | Admitting: Nurse Practitioner

## 2015-02-02 ENCOUNTER — Other Ambulatory Visit: Payer: Self-pay | Admitting: Interventional Cardiology

## 2015-02-11 ENCOUNTER — Other Ambulatory Visit: Payer: Self-pay | Admitting: *Deleted

## 2015-02-11 MED ORDER — FUROSEMIDE 20 MG PO TABS: 10.0000 mg | ORAL_TABLET | Freq: Every day | ORAL | Status: DC | PRN

## 2015-02-15 DIAGNOSIS — Z6839 Body mass index (BMI) 39.0-39.9, adult: Secondary | ICD-10-CM | POA: Diagnosis not present

## 2015-02-15 DIAGNOSIS — D179 Benign lipomatous neoplasm, unspecified: Secondary | ICD-10-CM | POA: Diagnosis not present

## 2015-02-17 DIAGNOSIS — Z794 Long term (current) use of insulin: Secondary | ICD-10-CM | POA: Diagnosis not present

## 2015-02-17 DIAGNOSIS — E1142 Type 2 diabetes mellitus with diabetic polyneuropathy: Secondary | ICD-10-CM | POA: Diagnosis not present

## 2015-02-17 DIAGNOSIS — E119 Type 2 diabetes mellitus without complications: Secondary | ICD-10-CM | POA: Diagnosis not present

## 2015-02-17 DIAGNOSIS — Z23 Encounter for immunization: Secondary | ICD-10-CM | POA: Diagnosis not present

## 2015-02-18 ENCOUNTER — Other Ambulatory Visit: Payer: Self-pay | Admitting: Gastroenterology

## 2015-02-24 ENCOUNTER — Telehealth: Payer: Self-pay

## 2015-02-24 ENCOUNTER — Encounter: Payer: Self-pay | Admitting: Nurse Practitioner

## 2015-02-24 ENCOUNTER — Ambulatory Visit (INDEPENDENT_AMBULATORY_CARE_PROVIDER_SITE_OTHER): Payer: Medicare Other | Admitting: Nurse Practitioner

## 2015-02-24 VITALS — BP 98/60 | HR 75 | Ht 65.0 in | Wt 230.4 lb

## 2015-02-24 DIAGNOSIS — I480 Paroxysmal atrial fibrillation: Secondary | ICD-10-CM

## 2015-02-24 DIAGNOSIS — I10 Essential (primary) hypertension: Secondary | ICD-10-CM | POA: Diagnosis not present

## 2015-02-24 DIAGNOSIS — Z79899 Other long term (current) drug therapy: Secondary | ICD-10-CM

## 2015-02-24 DIAGNOSIS — I4891 Unspecified atrial fibrillation: Secondary | ICD-10-CM | POA: Diagnosis not present

## 2015-02-24 LAB — HEPATIC FUNCTION PANEL
ALT: 51 U/L — ABNORMAL HIGH (ref 6–29)
AST: 78 U/L — ABNORMAL HIGH (ref 10–35)
Albumin: 4 g/dL (ref 3.6–5.1)
Alkaline Phosphatase: 137 U/L — ABNORMAL HIGH (ref 33–130)
Bilirubin, Direct: 0.1 mg/dL (ref <=0.2)
Indirect Bilirubin: 0.5 mg/dL (ref 0.2–1.2)
Total Bilirubin: 0.6 mg/dL (ref 0.2–1.2)
Total Protein: 6.6 g/dL (ref 6.1–8.1)

## 2015-02-24 LAB — CBC
HCT: 46.7 % — ABNORMAL HIGH (ref 36.0–46.0)
Hemoglobin: 15.6 g/dL — ABNORMAL HIGH (ref 12.0–15.0)
MCH: 30.1 pg (ref 26.0–34.0)
MCHC: 33.4 g/dL (ref 30.0–36.0)
MCV: 90.2 fL (ref 78.0–100.0)
MPV: 9.5 fL (ref 8.6–12.4)
Platelets: 310 10*3/uL (ref 150–400)
RBC: 5.18 MIL/uL — ABNORMAL HIGH (ref 3.87–5.11)
RDW: 13.7 % (ref 11.5–15.5)
WBC: 8.7 10*3/uL (ref 4.0–10.5)

## 2015-02-24 LAB — BASIC METABOLIC PANEL
BUN: 20 mg/dL (ref 7–25)
CO2: 26 mmol/L (ref 20–31)
Calcium: 8.8 mg/dL (ref 8.6–10.4)
Chloride: 101 mmol/L (ref 98–110)
Creat: 1.21 mg/dL — ABNORMAL HIGH (ref 0.50–0.99)
Glucose, Bld: 146 mg/dL — ABNORMAL HIGH (ref 65–99)
Potassium: 4.5 mmol/L (ref 3.5–5.3)
Sodium: 138 mmol/L (ref 135–146)

## 2015-02-24 LAB — TSH: TSH: 5.433 u[IU]/mL — ABNORMAL HIGH (ref 0.350–4.500)

## 2015-02-24 NOTE — Patient Instructions (Addendum)
We will be checking the following labs today - BMET, CBC, HPF, TSH   Medication Instructions:    Continue with your current medicines.     Testing/Procedures To Be Arranged:  PFTs with diffusion  Follow-Up:   See Dr. Tamala Julian in 6 months    Other Special Instructions:   Hold Eliquis for 3 days prior to your surgeries    If you need a refill on your cardiac medications before your next appointment, please call your pharmacy.   Call the [place] office at [phone] if you have any questions, problems or concerns.

## 2015-02-24 NOTE — Telephone Encounter (Signed)
Called to give pt lab results and Cecille Rubin G,NP recommendation. [email or web address]

## 2015-02-24 NOTE — Progress Notes (Signed)
CARDIOLOGY OFFICE NOTE  Date:  02/24/2015    [name] Date of Birth: 24-Jan-1946 Medical Record #[id number]  PCP:  Mathews Argyle, MD  Cardiologist:  Tamala Julian    Chief Complaint  Patient presents with  . Atrial Fibrillation    7 month check - seen for Dr. Tamala Julian    History of Present Illness: [name] is a 69 y.o. female who presents today for a 7 month check. Seen for Dr. Tamala Julian.   She has a history of AF - on amiodarone and has had prior cardioversion.  Other issues include DM, MVP, HLD, fibromyalgia, GERD and OSA. She is on chronic anticoagulation with Eliquis.   Last seen in March - felt to be doing ok.   Comes back today. Here alone. Doing well. No chest pain. Rhythm has been good. Looking at having surgery to have a large lipoma removed from her left thigh and surgery for achalasia down at [facility] - not scheduled yet. Remains in NSR. Not very active due to her fibromyalgia. Will use her Lasix prn - this is pretty rare. No problems with her Eliquis noted. BP running good at home and she is not having any dizzy spells unless she gets up too fast.   Past Medical History  Diagnosis Date  . Diabetes mellitus   . Hypertension   . MVP (mitral valve prolapse)   . Dyslipidemia   . Fibromyalgia   . GERD (gastroesophageal reflux disease)   . Dysrhythmia     hx. A.Fib-Cardioversion-converted rhythm  . Vitamin D deficiency 10-18-12  . OSA (obstructive sleep apnea) 10-18-12    no cpap used now.  . Complication of anesthesia 1975    projectile vomitting    Past Surgical History  Procedure Laterality Date  . Cardioversion  07/28/2011    Procedure: CARDIOVERSION;  Surgeon: Sinclair Grooms, MD;  Location: [facility];  Service: Cardiovascular;  Laterality: N/A;  . Abdominal hysterectomy      '95  . Tubal ligation    . Cataract extraction Left 10-18-12    10'13  . Thyroidectomy  1981  . Esophagogastroduodenoscopy (egd) with propofol N/A 12/03/2012    Procedure:  ESOPHAGOGASTRODUODENOSCOPY (EGD) WITH Dilation & w/PROPOFOL;  Surgeon: [name], MD;  Location: [facility];  Service: Endoscopy;  Laterality: N/A;  . Balloon dilation N/A 12/03/2012    Procedure: BALLOON DILATION;  Surgeon: [name], MD;  Location: [facility];  Service: Endoscopy;  Laterality: N/A;  . Esophageal manometry N/A 05/19/2013    Procedure: ESOPHAGEAL MANOMETRY (EM);  Surgeon: [name], MD;  Location: [facility];  Service: Endoscopy;  Laterality: N/A;     Medications: Current Outpatient Prescriptions  Medication Sig Dispense Refill  . acetaminophen (TYLENOL) 500 MG tablet Take 500 mg by mouth every 6 (six) hours as needed for pain.    [name] amiodarone (PACERONE) 200 MG tablet Take 0.5 tablets (100 mg total) by mouth daily. 45 tablet 2  . amphetamine-dextroamphetamine (ADDERALL) 20 MG tablet Take 20 mg by mouth 3 (three) times daily.     [name] atorvastatin (LIPITOR) 80 MG tablet Take 80 mg by mouth at bedtime.     . BD PEN NEEDLE NANO U/F 32G X 4 MM MISC     . Canagliflozin (INVOKANA) 100 MG TABS Take 100 mg by mouth daily with breakfast.    . Cholecalciferol (VITAMIN D) 2000 UNITS tablet Take 2,000 Units by mouth daily.    . Dulaglutide (TRULICITY) 1.5 EV/0.3JK  SOPN     . DULoxetine (CYMBALTA) 60 MG capsule Take 60 mg by mouth 2 (two) times daily.    [name] ELIQUIS 5 MG TABS tablet TAKE 1 TABLET TWICE A DAY 180 tablet 2  . esomeprazole (NEXIUM) 40 MG capsule Take 40 mg by mouth 2 (two) times daily.     . furosemide (LASIX) 20 MG tablet Take 0.5 tablets (10 mg total) by mouth daily as needed for fluid. 45 tablet 1  . insulin glargine (LANTUS) 100 UNIT/ML injection Inject 110 Units into the skin every morning.     [name] LORazepam (ATIVAN) 1 MG tablet Take 1 mg by mouth at bedtime as needed (for sleep).     . metoprolol tartrate (LOPRESSOR) 25 MG tablet Take 25 mg by mouth 2 (two) times daily.    . nitroGLYCERIN (NITROSTAT) 0.4 MG SL tablet Place 0.4 mg under the tongue  every 5 (five) minutes as needed. For chest pain    . oxyCODONE (OXY IR/ROXICODONE) 5 MG immediate release tablet Take 5 mg by mouth every 6 (six) hours as needed (for breakthrough pain).    [name] oxyCODONE (OXYCONTIN) 40 MG 12 hr tablet Take 40 mg by mouth every 8 (eight) hours as needed for pain.     . polyethylene glycol (MIRALAX / GLYCOLAX) packet Take 17 g by mouth daily as needed (for constipation).     . pregabalin (LYRICA) 150 MG capsule Take 150-450 mg by mouth. 1 in the am and 3 in the pm    . [DISCONTINUED] estrogens, conjugated, (PREMARIN) 0.9 MG tablet Take 0.9 mg by mouth daily.      . [DISCONTINUED] glimepiride (AMARYL) 4 MG tablet Take 4 mg by mouth 2 (two) times daily.     . [DISCONTINUED] metFORMIN (GLUCOPHAGE) 500 MG tablet Take 1,000 mg by mouth 2 (two) times daily with a meal.       No current facility-administered medications for this visit.    Allergies: Allergies  Allergen Reactions  . Dabigatran Etexilate Mesylate Other (See Comments)    Headaches   . Iohexol Other (See Comments)     Desc: EYES & LIPS SWELLING, SOB DURING A MYELOGRAM '88, DECADRON GIVEN/ PRE MEDS REQUIRED/A.C., Onset Date: [id number]   . Morphine And Related Other (See Comments)    hyperactivity  . Penicillins Hives and Swelling  . Demerol Nausea And Vomiting    Can take with phenrgan    Social History: The patient  reports that she has never smoked. She does not have any smokeless tobacco history on file. She reports that she does not drink alcohol or use illicit drugs.   Family History: The patient's family history includes Atrial fibrillation in her father; Bronchitis in her father; Diabetes type II in her father; Heart disease in her father; Hypertension in her mother; Hyperthyroidism in her mother; Kidney cancer in her sister.   Review of Systems: Please see the history of present illness.   Otherwise, the review of systems is positive for none.   All other systems are reviewed and negative.    Physical Exam: VS:  BP 98/60 mmHg  Pulse 75  Ht 5\' 5"  (1.651 m)  Wt 230 lb 6.4 oz (104.509 kg)  BMI 38.34 kg/m2 .  BMI Body mass index is 38.34 kg/(m^2).  Wt Readings from Last 3 Encounters:  02/24/15 230 lb 6.4 oz (104.509 kg)  07/20/14 221 lb 12.8 oz (100.608 kg)  11/20/13 234 lb 12.8 oz (106.505 kg)    General: Pleasant. She  is obese. She is in no acute distress. She is rather talkative.  HEENT: Normal. Neck: Supple, no JVD, carotid bruits, or masses noted.  Cardiac: Regular rate and rhythm. Heart tones are distant. Trace edema.  Respiratory:  Lungs are clear to auscultation bilaterally with normal work of breathing.  GI: Soft and nontender.  MS: No deformity or atrophy. Gait and ROM intact. Skin: Warm and dry. Color is normal.  Neuro:  Strength and sensation are intact and no gross focal deficits noted.  Psych: Alert, appropriate and with normal affect.   LABORATORY DATA:  EKG:  EKG is ordered today. This demonstrates NSR.  Lab Results  Component Value Date   HGB 15.3* 11/20/2013   GLUCOSE 163* 11/20/2013   ALT 18 07/20/2014   AST 17 07/20/2014   NA 141 11/20/2013   K 4.4 11/20/2013   CL 100 11/20/2013   CREATININE 1.3* 11/20/2013   BUN 14 11/20/2013   CO2 32 11/20/2013   TSH 3.73 07/20/2014    BNP (last 3 results) No results for input(s): BNP in the last 8760 hours.  ProBNP (last 3 results) No results for input(s): PROBNP in the last 8760 hours.   Other Studies Reviewed Today:   Assessment/Plan: 1. PAF - remains on amiodarone - remains in NSR. She is doing well clinically. Needs her surveillance labs today. Will send for PFTs.   2. High risk medicine - recheck surveillance labs today.   3. HTN - BP great on current regimen.   4. Chronic anticoagulation with Eliquis - no problems noted. She is planning on surgery in the near future - discussed with pharmacy here today - would hold her Eliquis at least 3 days prior. She is in NSR - no history of  stroke.   5. DM  6. Obesity  Current medicines are reviewed with the patient today.  The patient does not have concerns regarding medicines other than what has been noted above.  The following changes have been made:  See above.  Labs/ tests ordered today include:    Orders Placed This Encounter  Procedures  . Basic metabolic panel  . CBC  . TSH  . Hepatic function panel  . EKG 12-Lead     Disposition:   FU with Dr. Tamala Julian in 6 months.   Patient is agreeable to this plan and will call if any problems develop in the interim.   Signed: Burtis Junes, RN, ANP-C 02/24/2015 10:10 AM  [facility] [address] [place], [place]  [address] Phone: [phone] Fax: [phone]

## 2015-03-04 NOTE — Telephone Encounter (Addendum)
Pt aware of lab results and Tera Helper.,NP recommendations below with verbal understanding. Pt sts that's she will be seeing her pcp in Dec and will have her labs repeated. Lab results fwd to pt pcp. Pt rqst a copy of her labs be mailed to her. done  ---- Message from Burtis Junes, NP sent at 02/24/2015  4:07 PM EDT ----- Ok to report. Labs are stable but does have some elevation in her liver tests. TSH is a little higher.  Would recommend repeat TSH and hepatic profile in one month - can do with her PCP if she desires or here.

## 2015-03-26 NOTE — Progress Notes (Signed)
Left messages for pt to return rn call on 03-15-15, 03-16-15, 03-17-15, 03-22-15, 03-24-15 and 03-25-15. Pt never returned call.

## 2015-03-29 ENCOUNTER — Ambulatory Visit (HOSPITAL_COMMUNITY)
Admission: RE | Admit: 2015-03-29 | Discharge: 2015-03-29 | Disposition: A | Discharge status: Home / Self Care | Payer: Medicare Other | Source: Ambulatory Visit | Attending: Gastroenterology | Admitting: Gastroenterology

## 2015-03-29 ENCOUNTER — Encounter (HOSPITAL_COMMUNITY): Payer: Self-pay | Admitting: *Deleted

## 2015-03-29 ENCOUNTER — Encounter (HOSPITAL_COMMUNITY)
Admission: RE | Disposition: A | Discharge status: Home / Self Care | Payer: Self-pay | Source: Ambulatory Visit | Attending: Gastroenterology

## 2015-03-29 ENCOUNTER — Ambulatory Visit (HOSPITAL_COMMUNITY): Payer: Medicare Other | Admitting: Anesthesiology

## 2015-03-29 DIAGNOSIS — Z794 Long term (current) use of insulin: Secondary | ICD-10-CM | POA: Diagnosis not present

## 2015-03-29 DIAGNOSIS — M797 Fibromyalgia: Secondary | ICD-10-CM | POA: Diagnosis not present

## 2015-03-29 DIAGNOSIS — E7800 Pure hypercholesterolemia, unspecified: Secondary | ICD-10-CM | POA: Insufficient documentation

## 2015-03-29 DIAGNOSIS — Z800 Family history of malignant neoplasm of digestive organs: Secondary | ICD-10-CM | POA: Diagnosis not present

## 2015-03-29 DIAGNOSIS — D123 Benign neoplasm of transverse colon: Secondary | ICD-10-CM | POA: Insufficient documentation

## 2015-03-29 DIAGNOSIS — K219 Gastro-esophageal reflux disease without esophagitis: Secondary | ICD-10-CM | POA: Diagnosis not present

## 2015-03-29 DIAGNOSIS — Z7901 Long term (current) use of anticoagulants: Secondary | ICD-10-CM | POA: Diagnosis not present

## 2015-03-29 DIAGNOSIS — M1990 Unspecified osteoarthritis, unspecified site: Secondary | ICD-10-CM | POA: Diagnosis not present

## 2015-03-29 DIAGNOSIS — Z90710 Acquired absence of both cervix and uterus: Secondary | ICD-10-CM | POA: Insufficient documentation

## 2015-03-29 DIAGNOSIS — R5382 Chronic fatigue, unspecified: Secondary | ICD-10-CM | POA: Diagnosis not present

## 2015-03-29 DIAGNOSIS — Z1211 Encounter for screening for malignant neoplasm of colon: Secondary | ICD-10-CM | POA: Insufficient documentation

## 2015-03-29 DIAGNOSIS — E119 Type 2 diabetes mellitus without complications: Secondary | ICD-10-CM | POA: Diagnosis not present

## 2015-03-29 DIAGNOSIS — G4733 Obstructive sleep apnea (adult) (pediatric): Secondary | ICD-10-CM | POA: Diagnosis not present

## 2015-03-29 DIAGNOSIS — Z79899 Other long term (current) drug therapy: Secondary | ICD-10-CM | POA: Diagnosis not present

## 2015-03-29 DIAGNOSIS — K635 Polyp of colon: Secondary | ICD-10-CM | POA: Diagnosis not present

## 2015-03-29 DIAGNOSIS — Z86010 Personal history of colonic polyps: Secondary | ICD-10-CM | POA: Diagnosis not present

## 2015-03-29 DIAGNOSIS — I4891 Unspecified atrial fibrillation: Secondary | ICD-10-CM | POA: Insufficient documentation

## 2015-03-29 DIAGNOSIS — I10 Essential (primary) hypertension: Secondary | ICD-10-CM | POA: Diagnosis not present

## 2015-03-29 HISTORY — PX: COLONOSCOPY WITH PROPOFOL: SHX5780

## 2015-03-29 LAB — GLUCOSE, CAPILLARY
Glucose-Capillary: 70 mg/dL (ref 65–99)
Glucose-Capillary: 48 mg/dL — ABNORMAL LOW (ref 65–99)
Glucose-Capillary: 51 mg/dL — ABNORMAL LOW (ref 65–99)
Glucose-Capillary: 54 mg/dL — ABNORMAL LOW (ref 65–99)
Glucose-Capillary: 60 mg/dL — ABNORMAL LOW (ref 65–99)

## 2015-03-29 SURGERY — COLONOSCOPY WITH PROPOFOL
Anesthesia: Monitor Anesthesia Care

## 2015-03-29 MED ORDER — LACTATED RINGERS IV SOLN
INTRAVENOUS | Status: DC
  Administered 2015-03-29: 14:00:00 via INTRAVENOUS
  Administered 2015-03-29: 1000 mL via INTRAVENOUS

## 2015-03-29 MED ORDER — LIDOCAINE HCL (CARDIAC) 20 MG/ML IV SOLN
INTRAVENOUS | Status: DC | PRN
  Administered 2015-03-29 (×2): 50 mg via INTRAVENOUS

## 2015-03-29 MED ORDER — MIDAZOLAM HCL 2 MG/2ML IJ SOLN
INTRAMUSCULAR | Status: AC
  Filled 2015-03-29: qty 2

## 2015-03-29 MED ORDER — PROPOFOL 10 MG/ML IV BOLUS
INTRAVENOUS | Status: DC | PRN
  Administered 2015-03-29 (×2): 10 mg via INTRAVENOUS
  Administered 2015-03-29: 20 mg via INTRAVENOUS
  Administered 2015-03-29 (×2): 10 mg via INTRAVENOUS
  Administered 2015-03-29: 40 mg via INTRAVENOUS
  Administered 2015-03-29: 50 mg via INTRAVENOUS

## 2015-03-29 MED ORDER — MIDAZOLAM HCL 5 MG/5ML IJ SOLN
INTRAMUSCULAR | Status: DC | PRN
  Administered 2015-03-29: 1 mg via INTRAVENOUS

## 2015-03-29 MED ORDER — LIDOCAINE HCL 1 % IJ SOLN
INTRAMUSCULAR | Status: AC
  Filled 2015-03-29: qty 20

## 2015-03-29 MED ORDER — PROPOFOL 10 MG/ML IV BOLUS
INTRAVENOUS | Status: AC
  Filled 2015-03-29: qty 40

## 2015-03-29 MED ORDER — SODIUM CHLORIDE 0.9 % IV SOLN: INTRAVENOUS | Status: DC

## 2015-03-29 MED ORDER — GLUCAGON HCL RDNA (DIAGNOSTIC) 1 MG IJ SOLR
INTRAMUSCULAR | Status: DC | PRN
  Administered 2015-03-29 (×2): .5 mg via INTRAVENOUS

## 2015-03-29 SURGICAL SUPPLY — 22 items

## 2015-03-29 NOTE — Anesthesia Postprocedure Evaluation (Signed)
Anesthesia Post Note  Patient: [name]  Procedure(s) Performed: Procedure(s) (LRB): COLONOSCOPY WITH PROPOFOL (N/A)  Patient location during evaluation: PACU Anesthesia Type: MAC Level of consciousness: awake and alert Pain management: pain level controlled Vital Signs Assessment: post-procedure vital signs reviewed and stable Respiratory status: spontaneous breathing, nonlabored ventilation, respiratory function stable and patient connected to nasal cannula oxygen Cardiovascular status: blood pressure returned to baseline and stable Postop Assessment: no signs of nausea or vomiting Anesthetic complications: no    Last Vitals:  Filed Vitals:   03/29/15 1430 03/29/15 1440  BP: 104/48 115/85  Pulse: 57 57  Temp:    Resp: 13 11    Last Pain: There were no vitals filed for this visit.               Kip Cropp JENNETTE

## 2015-03-29 NOTE — Discharge Instructions (Signed)
Colon Polyps °Polyps are lumps of extra tissue growing inside the body. Polyps can grow in the large intestine (colon). Most colon polyps are noncancerous (benign). However, some colon polyps can become cancerous over time. Polyps that are larger than a pea may be harmful. To be safe, caregivers remove and test all polyps. °CAUSES  °Polyps form when mutations in the genes cause your cells to grow and divide even though no more tissue is needed. °RISK FACTORS °There are a number of risk factors that can increase your chances of getting colon polyps. They include: °· Being older than 50 years. °· Family history of colon polyps or colon cancer. °· Long-term colon diseases, such as colitis or Crohn disease. °· Being overweight. °· Smoking. °· Being inactive. °· Drinking too much alcohol. °SYMPTOMS  °Most small polyps do not cause symptoms. If symptoms are present, they may include: °· Blood in the stool. The stool may look dark red or black. °· Constipation or diarrhea that lasts longer than 1 week. °DIAGNOSIS °People often do not know they have polyps until their caregiver finds them during a regular checkup. Your caregiver can use 4 tests to check for polyps: °· Digital rectal exam. The caregiver wears gloves and feels inside the rectum. This test would find polyps only in the rectum. °· Barium enema. The caregiver puts a liquid called barium into your rectum before taking X-rays of your colon. Barium makes your colon look white. Polyps are dark, so they are easy to see in the X-ray pictures. °· Sigmoidoscopy. A thin, flexible tube (sigmoidoscope) is placed into your rectum. The sigmoidoscope has a light and tiny camera in it. The caregiver uses the sigmoidoscope to look at the last third of your colon. °· Colonoscopy. This test is like sigmoidoscopy, but the caregiver looks at the entire colon. This is the most common method for finding and removing polyps. °TREATMENT  °Any polyps will be removed during a  sigmoidoscopy or colonoscopy. The polyps are then tested for cancer. °PREVENTION  °To help lower your risk of getting more colon polyps: °· Eat plenty of fruits and vegetables. Avoid eating fatty foods. °· Do not smoke. °· Avoid drinking alcohol. °· Exercise every day. °· Lose weight if recommended by your caregiver. °· Eat plenty of calcium and folate. Foods that are rich in calcium include milk, cheese, and broccoli. Foods that are rich in folate include chickpeas, kidney beans, and spinach. °HOME CARE INSTRUCTIONS °Keep all follow-up appointments as directed by your caregiver. You may need periodic exams to check for polyps. °SEEK MEDICAL CARE IF: °You notice bleeding during a bowel movement. °  °This information is not intended to replace advice given to you by your health care provider. Make sure you discuss any questions you have with your health care provider. °  °Document Released: 01/05/2004 Document Revised: 05/01/2014 Document Reviewed: 06/20/2011 °Elsevier Interactive Patient Education ©2016 [employer] ° ° °Colonoscopy, Care After °These instructions give you information on caring for yourself after your procedure. Your doctor may also give you more specific instructions. Call your doctor if you have any problems or questions after your procedure. °HOME CARE °· Do not drive for 24 hours. °· Do not sign important papers or use machinery for 24 hours. °· You may shower. °· You may go back to your usual activities, but go slower for the first 24 hours. °· Take rest breaks often during the first 24 hours. °· Walk around or use warm packs on your belly (abdomen)   if you have belly cramping or gas. °· Drink enough fluids to keep your pee (urine) clear or pale yellow. °· Resume your normal diet. Avoid heavy or fried foods. °· Avoid drinking alcohol for 24 hours or as told by your doctor. °· Only take medicines as told by your doctor. °If a tissue sample (biopsy) was taken during the procedure:  °· Do not take  aspirin or blood thinners for 7 days, or as told by your doctor. °· Do not drink alcohol for 7 days, or as told by your doctor. °· Eat soft foods for the first 24 hours. °GET HELP IF: °You still have a small amount of blood in your poop (stool) 2-3 days after the procedure. °GET HELP RIGHT AWAY IF: °· You have more than a small amount of blood in your poop. °· You see clumps of tissue (blood clots) in your poop. °· Your belly is puffy (swollen). °· You feel sick to your stomach (nauseous) or throw up (vomit). °· You have a fever. °· You have belly pain that gets worse and medicine does not help. °MAKE SURE YOU: °· Understand these instructions. °· Will watch your condition. °· Will get help right away if you are not doing well or get worse. °  °This information is not intended to replace advice given to you by your health care provider. Make sure you discuss any questions you have with your health care provider. °  °Document Released: 05/13/2010 Document Revised: 04/15/2013 Document Reviewed: 12/16/2012 °Elsevier Interactive Patient Education ©2016 [employer] Inc[employer] ° °

## 2015-03-29 NOTE — H&P (Signed)
  Procedure: Screening colonoscopy. Sister diagnosed with colon cancer. Type II achalasia. Normal esophagogastroduodenoscopies performed in August 2000 and August 2007. Normal screening colonoscopies performed in August 2000 and August 2007. 12/03/2012 esophagogastroduodenoscopy performed with removal of two small hyperplastic gastric polyps. Chronic anticoagulation with Eliquis.  History: The patient is a 69 year old female born [birth year] benign. She is scheduled to undergo a repeat screening colonoscopy today. Her sister was diagnosed with colon cancer. She stopped taking her anticoagulant 4 days ago.  Past medical history: Type 2 diabetes mellitus. Fibromyalgia syndrome. Chronic fatigue syndrome. Endometriosis. Gastroesophageal reflux. Osteoarthritis. Cervical disc disease. Obstructive sleep apnea syndrome. Atrial fibrillation cardioverted to normal sinus rhythm in 2013. Left carpal tunnel syndrome. Hypercholesterolemia. Bilateral tubal ligation. Hysterectomy. Cardiac catheterization performed in 1989. Right thyroidectomy. Cataract surgery. Laparoscopic cholecystectomy.  Exam: The patient is alert and lying comfortably on the endoscopy stretcher. Abdomen is soft and nontender to palpation. Lungs are clear to auscultation. Cardiac exam reveals a regular rhythm.  Plan: Proceed with screening colonoscopy

## 2015-03-29 NOTE — Anesthesia Preprocedure Evaluation (Addendum)
Anesthesia Evaluation  Patient identified by MRN, date of birth, ID band Patient awake    Reviewed: Allergy & Precautions, NPO status , Patient's Chart, lab work & pertinent test results  History of Anesthesia Complications (+) PONV and history of anesthetic complications  Airway Mallampati: III  TM Distance: >3 FB Neck ROM: Full    Dental no notable dental hx. (+) Poor Dentition, Dental Advisory Given   Pulmonary sleep apnea ,    Pulmonary exam normal breath sounds clear to auscultation       Cardiovascular hypertension, Pt. on medications Normal cardiovascular exam+ dysrhythmias Atrial Fibrillation  Rhythm:Regular Rate:Normal     Neuro/Psych PSYCHIATRIC DISORDERS negative neurological ROS     GI/Hepatic Neg liver ROS, GERD  Medicated and Controlled,  Endo/Other  diabetesobesity  Renal/GU negative Renal ROS  negative genitourinary   Musculoskeletal  (+) Fibromyalgia -  Abdominal   Peds negative pediatric ROS (+)  Hematology negative hematology ROS (+)   Anesthesia Other Findings   Reproductive/Obstetrics negative OB ROS                            Anesthesia Physical Anesthesia Plan  ASA: III  Anesthesia Plan: MAC   Post-op Pain Management:    Induction: Intravenous  Airway Management Planned: Nasal Cannula  Additional Equipment:   Intra-op Plan:   Post-operative Plan:   Informed Consent: I have reviewed the patients History and Physical, chart, labs and discussed the procedure including the risks, benefits and alternatives for the proposed anesthesia with the patient or authorized representative who has indicated his/her understanding and acceptance.   Dental advisory given  Plan Discussed with: CRNA  Anesthesia Plan Comments:        Anesthesia Quick Evaluation

## 2015-03-29 NOTE — Transfer of Care (Signed)
Immediate Anesthesia Transfer of Care Note  Patient: [name]  Procedure(s) Performed: Procedure(s): COLONOSCOPY WITH PROPOFOL (N/A)  Patient Location: PACU, Endo Recovery  Anesthesia Type:MAC  Level of Consciousness: Patient easily awoken, sedated, comfortable, cooperative, following commands, responds to stimulation.   Airway & Oxygen Therapy: Patient spontaneously breathing, ventilating well, oxygen via simple oxygen mask.  Post-op Assessment: Report given to PACU RN, vital signs reviewed and stable, moving all extremities.   Post vital signs: Reviewed and stable.  Complications: No apparent anesthesia complications

## 2015-03-29 NOTE — Op Note (Signed)
Procedure: Screening colonoscopy. Sister diagnosed with colon cancer in her early 29s. Normal screening colonoscopies performed in August 2000 and August 2007  Endoscopist: Earle Gell  Premedication: Propofol administered by anesthesia  Procedure: The patient was placed in the left lateral decubitus position. Anal inspection and digital rectal exam were normal. The Pentax pediatric colonoscope was introduced into the rectum and advanced to the cecum with difficulty due to colonic loop formation. A normal-appearing appendiceal orifice and ileocecal valve were identified. Colonic preparation for today's exam was fair at best. To try to control colonic spasm, glucagon 1 mg intravenously was given with only fair success. Withdrawal time was 27 minutes  Rectum. Normal. Retroflex view of the distal rectum was normal  Sigmoid colon and descending colon. Normal  Splenic flexure. Normal  Transverse colon. Normal  Hepatic flexure. A 5 mm sessile polyp was removed from the hepatic flexure with the cold snare. An Endo Clip was deployed to the polypectomy site but failed to adhere to the mucosa. Repeat Endo Clip he was not performed because bleeding stopped.  Ascending colon. Normal  Cecum and ileocecal valve. Normal  Assessment:  #1. Colon preparation for screening colonoscopy today was fair at best  #2. A 5 mm sessile polyp was removed from the hepatic flexure with the cold snare  Recommendation: I will review the colon polyp pathology. The patient should probably undergo a repeat screening colonoscopy in 1 year.

## 2015-03-30 ENCOUNTER — Encounter (HOSPITAL_COMMUNITY): Payer: Self-pay | Admitting: Gastroenterology

## 2015-03-30 LAB — GLUCOSE, CAPILLARY: Glucose-Capillary: 44 mg/dL — CL (ref 65–99)

## 2015-04-21 ENCOUNTER — Telehealth: Payer: Self-pay | Admitting: Interventional Cardiology

## 2015-04-21 NOTE — Telephone Encounter (Signed)
Returned pt's call and advised her that it was her TSH and Hepatic Profile that she needed to repeat.  Pt was asked if she had any further questions, and she did not.  She verbalized appreciation for the returned phone call.

## 2015-04-21 NOTE — Telephone Encounter (Signed)
New problem   Pt need to know which two of her labs does she need to repeat. Please call pt.

## 2015-04-29 ENCOUNTER — Ambulatory Visit (INDEPENDENT_AMBULATORY_CARE_PROVIDER_SITE_OTHER): Payer: Medicare Other | Admitting: Internal Medicine

## 2015-04-29 DIAGNOSIS — Z79899 Other long term (current) drug therapy: Secondary | ICD-10-CM

## 2015-04-29 DIAGNOSIS — I480 Paroxysmal atrial fibrillation: Secondary | ICD-10-CM

## 2015-04-29 DIAGNOSIS — I10 Essential (primary) hypertension: Secondary | ICD-10-CM

## 2015-04-29 LAB — PULMONARY FUNCTION TEST
DL/VA % pred: 112 %
DL/VA: 5.5 ml/min/mmHg/L
DLCO unc % pred: 92 %
DLCO unc: 23.42 ml/min/mmHg
FEF 25-75 Post: 4.23 L/sec
FEF 25-75 Pre: 4.22 L/sec
FEF2575-%Change-Post: 0 %
FEF2575-%Pred-Post: 206 %
FEF2575-%Pred-Pre: 206 %
FEV1-%Change-Post: 0 %
FEV1-%Pred-Post: 114 %
FEV1-%Pred-Pre: 113 %
FEV1-Post: 2.75 L
FEV1-Pre: 2.74 L
FEV1FVC-%Change-Post: 0 %
FEV1FVC-%Pred-Pre: 116 %
FEV6-%Change-Post: -1 %
FEV6-%Pred-Post: 100 %
FEV6-%Pred-Pre: 102 %
FEV6-Post: 3.07 L
FEV6-Pre: 3.11 L
FEV6FVC-%Pred-Post: 104 %
FEV6FVC-%Pred-Pre: 104 %
FVC-%Change-Post: 0 %
FVC-%Pred-Post: 97 %
FVC-%Pred-Pre: 98 %
FVC-Post: 3.09 L
FVC-Pre: 3.11 L
Post FEV1/FVC ratio: 89 %
Post FEV6/FVC ratio: 100 %
Pre FEV1/FVC ratio: 88 %
Pre FEV6/FVC Ratio: 100 %
RV % pred: 72 %
RV: 1.58 L
TLC % pred: 91 %
TLC: 4.73 L

## 2015-04-29 NOTE — Progress Notes (Signed)
PFT done today. 

## 2015-04-30 ENCOUNTER — Other Ambulatory Visit: Payer: Self-pay | Admitting: Interventional Cardiology

## 2015-04-30 DIAGNOSIS — I480 Paroxysmal atrial fibrillation: Secondary | ICD-10-CM

## 2015-04-30 MED ORDER — AMIODARONE HCL 200 MG PO TABS: 100.0000 mg | ORAL_TABLET | Freq: Every day | ORAL | Status: DC

## 2015-05-06 ENCOUNTER — Encounter: Payer: Self-pay | Admitting: Internal Medicine

## 2015-05-06 ENCOUNTER — Ambulatory Visit (INDEPENDENT_AMBULATORY_CARE_PROVIDER_SITE_OTHER): Payer: Medicare Other | Admitting: Internal Medicine

## 2015-05-06 VITALS — BP 128/72 | HR 79 | Ht 65.0 in | Wt 228.0 lb

## 2015-05-06 DIAGNOSIS — Z79899 Other long term (current) drug therapy: Secondary | ICD-10-CM

## 2015-05-06 DIAGNOSIS — G4733 Obstructive sleep apnea (adult) (pediatric): Secondary | ICD-10-CM | POA: Diagnosis not present

## 2015-05-06 DIAGNOSIS — G253 Myoclonus: Secondary | ICD-10-CM

## 2015-05-06 NOTE — Patient Instructions (Signed)
Order- Split protocol NPSG   Dx OSA, complicated by atrial fibrillation, cardiomegaly, limb jerks   Consider asking at your pharmacy if they have a dry mouth treatment called "Salivart", for comparison with Biotene

## 2015-05-06 NOTE — Assessment & Plan Note (Signed)
PFT within normal limits 2017

## 2015-05-06 NOTE — Assessment & Plan Note (Addendum)
We reviewed medical concerns, especially as related to her heart disease, of untreated OSA. She had significant difficulty before getting comfortable with CPAP but is willing to have reassessment. She complains of drooling at night but of dry mouth during the daytime. Has tried Biotene. Discussed alternatives. This may impact her ability to tolerate CPAP Plan-split protocol attended sleep study

## 2015-05-06 NOTE — Progress Notes (Signed)
05/06/2015-70 year old female with remote history OSA returning to reestablish for reassessment of this problem. Husband Rica Mote is our patient and comes with her Pt. states she has CPAP @ home but hasn't used in yrs due to sleeping with her mouth open.  increased daytime sleepiness. occ. loud snoring and jerking Chronic mouth breather. Says she tried fullface mask but she always drools. Chin strap caused "panic". Husband confirms that she snores loudly and stops breathing. She complains that she feels "half asleep and have to wake through the night, waking unrefreshed. Routinely takes a nap in the afternoon. Occasionally uses Benadryl or Ativan to help initiate sleep. Additional diagnosis of ADHD/chronic fatigue syndrome treated with Adderall 20 mg, taking 1-1/2 tabs twice daily on most days, not later than 3 PM. History of fibromyalgia for which she takes OxyContin, usually at bedtime. History partial thyroidectomy 1982. Atrial fibrillation managed with amiodarone. Recent PFT requested by cardiology. Nasal stuffiness blamed on allergy, perennial with some variation. She is concerned about muscle jerks, visible to her husband, during the night but they seem to continue into the daytime.  CXR 07/20/2014-borderline cardiomegaly, clear lungs PFT 04/29/2015- WNL, FEV1/FVC 0.89, TLC 91%, DLCO 92% NPSG 06/05/2006-AHI 20 per hour, desaturation to 70%.   Prior to Admission medications   Medication Sig Start Date End Date Taking? Authorizing Provider  acetaminophen (TYLENOL) 500 MG tablet Take 500 mg by mouth every 6 (six) hours as needed for mild pain or fever.    Yes Historical Provider, MD  amiodarone (PACERONE) 200 MG tablet Take 0.5 tablets (100 mg total) by mouth daily. 04/30/15  Yes Belva Crome, MD  amphetamine-dextroamphetamine (ADDERALL) 20 MG tablet Take 20 mg by mouth 3 (three) times daily.    Yes Historical Provider, MD  atorvastatin (LIPITOR) 80 MG tablet Take 80 mg by mouth at bedtime.    Yes  Historical Provider, MD  BD PEN NEEDLE NANO U/F 32G X 4 MM MISC  03/22/13  Yes Historical Provider, MD  Canagliflozin (INVOKANA) 100 MG TABS Take 100 mg by mouth daily with breakfast.   Yes Historical Provider, MD  Cholecalciferol (VITAMIN D) 2000 UNITS tablet Take 2,000 Units by mouth daily.   Yes Historical Provider, MD  Dulaglutide (TRULICITY) 1.5 0000000 SOPN Inject 1.5 mg into the skin every 7 (seven) days. Thursdays 12/16/14  Yes Historical Provider, MD  DULoxetine (CYMBALTA) 60 MG capsule Take 60 mg by mouth 2 (two) times daily.   Yes Historical Provider, MD  ELIQUIS 5 MG TABS tablet  02/27/15  Yes Historical Provider, MD  esomeprazole (NEXIUM) 40 MG capsule Take 40 mg by mouth 2 (two) times daily.    Yes Historical Provider, MD  furosemide (LASIX) 20 MG tablet Take 0.5 tablets (10 mg total) by mouth daily as needed for fluid. 02/11/15  Yes Belva Crome, MD  insulin glargine (LANTUS) 100 UNIT/ML injection Inject 115 Units into the skin every morning.    Yes Historical Provider, MD  LORazepam (ATIVAN) 1 MG tablet Take 1 mg by mouth 2 (two) times daily as needed for anxiety.    Yes Historical Provider, MD  metoprolol tartrate (LOPRESSOR) 25 MG tablet Take 25 mg by mouth 2 (two) times daily.   Yes Historical Provider, MD  nitroGLYCERIN (NITROSTAT) 0.4 MG SL tablet Place 0.4 mg under the tongue every 5 (five) minutes as needed. For chest pain from esophageal spasms   Yes Historical Provider, MD  oxyCODONE (OXY IR/ROXICODONE) 5 MG immediate release tablet Take 5 mg by mouth every 6 (  six) hours as needed (for breakthrough pain).   Yes Historical Provider, MD  oxyCODONE (OXYCONTIN) 40 MG 12 hr tablet Take 40 mg by mouth every 8 (eight) hours.    Yes Historical Provider, MD  polyethylene glycol (MIRALAX / GLYCOLAX) packet Take 17-34 g by mouth daily. Higher dose if she feels more constipated.   Yes Historical Provider, MD  pregabalin (LYRICA) 150 MG capsule Take 150-450 mg by mouth. 1 in the am and 3  in the pm   Yes Historical Provider, MD   Past Medical History  Diagnosis Date  . Diabetes mellitus   . Hypertension   . MVP (mitral valve prolapse)   . Dyslipidemia   . Fibromyalgia   . GERD (gastroesophageal reflux disease)   . Dysrhythmia     hx. A.Fib-Cardioversion-converted rhythm  . Vitamin D deficiency 10-18-12  . OSA (obstructive sleep apnea) 10-18-12    no cpap used now.  . Complication of anesthesia 1975    projectile vomitting   Past Surgical History  Procedure Laterality Date  . Cardioversion  07/28/2011    Procedure: CARDIOVERSION;  Surgeon: Sinclair Grooms, MD;  Location: [facility];  Service: Cardiovascular;  Laterality: N/A;  . Abdominal hysterectomy      '95  . Tubal ligation    . Cataract extraction Left 10-18-12    10'13  . Thyroidectomy  1981  . Esophagogastroduodenoscopy (egd) with propofol N/A 12/03/2012    Procedure: ESOPHAGOGASTRODUODENOSCOPY (EGD) WITH Dilation & w/PROPOFOL;  Surgeon: [name], MD;  Location: [facility];  Service: Endoscopy;  Laterality: N/A;  . Balloon dilation N/A 12/03/2012    Procedure: BALLOON DILATION;  Surgeon: [name], MD;  Location: [facility];  Service: Endoscopy;  Laterality: N/A;  . Esophageal manometry N/A 05/19/2013    Procedure: ESOPHAGEAL MANOMETRY (EM);  Surgeon: [name], MD;  Location: [facility];  Service: Endoscopy;  Laterality: N/A;  . Colonoscopy with propofol N/A 03/29/2015    Procedure: COLONOSCOPY WITH PROPOFOL;  Surgeon: [name], MD;  Location: [facility];  Service: Endoscopy;  Laterality: N/A;   Family History  Problem Relation Age of Onset  . Hypertension Mother   . Hyperthyroidism Mother   . Diabetes type II Father   . Heart disease Father   . Atrial fibrillation Father   . Bronchitis Father   . Kidney cancer Sister    Social History   Social History  . Marital Status: Married    Spouse Name: N/A  . Number of Children: N/A  . Years of Education: N/A    Occupational History  . Not on file.   Social History Main Topics  . Smoking status: Never Smoker   . Smokeless tobacco: Not on file  . Alcohol Use: No  . Drug Use: No  . Sexual Activity: Yes   Other Topics Concern  . Not on file   Social History Narrative   ROS-see HPI   Negative unless "+" Constitutional:    weight loss, night sweats, fevers, chills, + fatigue, lassitude. HEENT:    headaches, difficulty swallowing, tooth/dental problems, sore throat,       sneezing, itching, ear ache, + nasal congestion, post nasal drip, snoring CV:    chest pain, orthopnea, PND, swelling in lower extremities, anasarca,  dizziness, palpitations Resp:   shortness of breath with exertion or at rest.                productive cough,   non-productive cough, coughing up of blood.              change in color of mucus.  wheezing.   Skin:    rash or lesions. GI:  No-   heartburn, indigestion, abdominal pain, nausea, vomiting, diarrhea,                 change in bowel habits, loss of appetite GU: dysuria, change in color of urine, no urgency or frequency.   flank pain. MS:   + joint pain, stiffness, decreased range of motion, back pain. Neuro-    + history of present illness Psych:  change in mood or affect.  depression or anxiety.   memory loss.  OBJ- Physical Exam General- Alert, Oriented, Affect-appropriate, Distress- none acute, + obese Skin- rash-none, lesions- none, excoriation- none Lymphadenopathy- none Head- atraumatic            Eyes- Gross vision intact, PERRLA, conjunctivae and secretions clear            Ears- Hearing, canals-normal            Nose- Clear, no-Septal dev, mucus, polyps, erosion, perforation             Throat- Mallampati II palate is thin and posteriorly placed , mucosa clear , drainage- none, tonsils- atrophic Neck- flexible , trachea midline, no stridor , thyroid nl, carotid no bruit Chest - symmetrical  excursion , unlabored           Heart/CV- RRR-radial pulses thready , no murmur , no gallop  , no rub, nl s1 s2                           - JVD- none , edema- none, stasis changes- none, varices- none           Lung- clear to P&A, wheeze- none, cough- none , dullness-none, rub- none           Chest wall-  Abd-  Br/ Gen/ Rectal- Not done, not indicated Extrem- cyanosis- none, clubbing, none, atrophy- none, strength- nl Neuro- grossly intact to observation

## 2015-05-06 NOTE — Assessment & Plan Note (Signed)
During the night these would probably be associated with respiratory arousals from apnea. There may be something else going on since she says pattern is visible during the daytime as well. Plan-consider neurology referral through her PCP

## 2015-05-11 DIAGNOSIS — M8589 Other specified disorders of bone density and structure, multiple sites: Secondary | ICD-10-CM | POA: Diagnosis not present

## 2015-05-11 DIAGNOSIS — Z780 Asymptomatic menopausal state: Secondary | ICD-10-CM | POA: Diagnosis not present

## 2015-05-20 ENCOUNTER — Other Ambulatory Visit: Payer: Self-pay | Admitting: Geriatric Medicine

## 2015-05-20 ENCOUNTER — Telehealth: Payer: Self-pay | Admitting: Interventional Cardiology

## 2015-05-20 DIAGNOSIS — Z79899 Other long term (current) drug therapy: Secondary | ICD-10-CM | POA: Diagnosis not present

## 2015-05-20 DIAGNOSIS — R229 Localized swelling, mass and lump, unspecified: Secondary | ICD-10-CM

## 2015-05-20 DIAGNOSIS — Z794 Long term (current) use of insulin: Secondary | ICD-10-CM | POA: Diagnosis not present

## 2015-05-20 DIAGNOSIS — N183 Chronic kidney disease, stage 3 (moderate): Secondary | ICD-10-CM | POA: Diagnosis not present

## 2015-05-20 DIAGNOSIS — M797 Fibromyalgia: Secondary | ICD-10-CM | POA: Diagnosis not present

## 2015-05-20 DIAGNOSIS — R5382 Chronic fatigue, unspecified: Secondary | ICD-10-CM | POA: Diagnosis not present

## 2015-05-20 DIAGNOSIS — E1121 Type 2 diabetes mellitus with diabetic nephropathy: Secondary | ICD-10-CM | POA: Diagnosis not present

## 2015-05-20 DIAGNOSIS — E119 Type 2 diabetes mellitus without complications: Secondary | ICD-10-CM | POA: Diagnosis not present

## 2015-05-20 NOTE — Telephone Encounter (Signed)
Returned pt call. Adv her that Dr.Smith recommended she have her Tsh and Hepatic repeated with her pcp. Pt sts that her Dec 2016 appt with her pcp was rescheduled until today. Ask pt to have a copy of results fwd to our office when the are available. Pt verbalized understanding.

## 2015-05-20 NOTE — Telephone Encounter (Signed)
New message     Pt want to know what is the name of the lab she is to have repeated?  She is going to see her PCP today and will have them draw it but do not know the name of the test.  Her appt with the PCP is this afternoon at 3.  Please call

## 2015-05-26 ENCOUNTER — Inpatient Hospital Stay: Admission: RE | Admit: 2015-05-26 | Payer: Medicare Other | Source: Ambulatory Visit

## 2015-05-26 ENCOUNTER — Other Ambulatory Visit: Payer: Medicare Other

## 2015-06-10 ENCOUNTER — Telehealth: Payer: Self-pay | Admitting: Interventional Cardiology

## 2015-06-10 NOTE — Telephone Encounter (Signed)
She needs to know if pt can stop her Eliquis for her surgery on 06-15-15? Please call back,if not there please leave message.

## 2015-06-10 NOTE — Telephone Encounter (Signed)
Returned call to [facility].  Dr.Smith is out of the office this afternoon. They need to specify how many days they are requesting pt hold Eliquis Will fwd a message  to Dr.Smith to advise.

## 2015-06-10 NOTE — Telephone Encounter (Signed)
D

## 2015-06-10 NOTE — Telephone Encounter (Signed)
She needs an answer today since her surgery is Tuesday.

## 2015-06-10 NOTE — Telephone Encounter (Signed)
She need to know if she can stop her Eliquis for surgery on 06-15-15. If she is not there,please leave a message.

## 2015-06-11 ENCOUNTER — Ambulatory Visit (HOSPITAL_BASED_OUTPATIENT_CLINIC_OR_DEPARTMENT_OTHER): Payer: Medicare Other | Attending: Internal Medicine

## 2015-06-11 NOTE — Telephone Encounter (Signed)
Pt says she is still waiting to find out if she can stop her Eliquis. Her surgery is scheduled for Tuesday. She took her last pill yesterday morning. She needs to know asap please.

## 2015-06-11 NOTE — Telephone Encounter (Signed)
Notified her that Dr. Tamala Julian advises she can discontinue her Eliquis prior to surgery.  She states she stopped yesterday which is 5 days prior to surgery.

## 2015-06-11 NOTE — Telephone Encounter (Signed)
It is okay to discontinue Eliquis in preparation for surgery.

## 2015-06-21 ENCOUNTER — Ambulatory Visit (HOSPITAL_BASED_OUTPATIENT_CLINIC_OR_DEPARTMENT_OTHER): Payer: Medicare Other

## 2015-06-27 ENCOUNTER — Encounter (HOSPITAL_BASED_OUTPATIENT_CLINIC_OR_DEPARTMENT_OTHER): Payer: Medicare Other

## 2015-07-13 ENCOUNTER — Ambulatory Visit: Payer: Medicare Other | Admitting: Internal Medicine

## 2015-07-22 DIAGNOSIS — L718 Other rosacea: Secondary | ICD-10-CM | POA: Diagnosis not present

## 2015-07-22 DIAGNOSIS — L304 Erythema intertrigo: Secondary | ICD-10-CM | POA: Diagnosis not present

## 2015-07-22 DIAGNOSIS — L309 Dermatitis, unspecified: Secondary | ICD-10-CM | POA: Diagnosis not present

## 2015-07-22 DIAGNOSIS — K130 Diseases of lips: Secondary | ICD-10-CM | POA: Diagnosis not present

## 2015-07-30 ENCOUNTER — Other Ambulatory Visit: Payer: Self-pay

## 2015-07-30 DIAGNOSIS — I480 Paroxysmal atrial fibrillation: Secondary | ICD-10-CM

## 2015-08-02 MED ORDER — AMIODARONE HCL 200 MG PO TABS: 100.0000 mg | ORAL_TABLET | Freq: Every day | ORAL | Status: DC

## 2015-08-16 ENCOUNTER — Other Ambulatory Visit: Payer: Self-pay | Admitting: Interventional Cardiology

## 2015-08-16 DIAGNOSIS — I480 Paroxysmal atrial fibrillation: Secondary | ICD-10-CM

## 2015-08-16 MED ORDER — AMIODARONE HCL 200 MG PO TABS: 100.0000 mg | ORAL_TABLET | Freq: Every day | ORAL | Status: DC

## 2015-08-16 MED ORDER — METOPROLOL TARTRATE 25 MG PO TABS: 25.0000 mg | ORAL_TABLET | Freq: Two times a day (BID) | ORAL | Status: DC

## 2015-08-16 NOTE — Telephone Encounter (Signed)
°*  STAT* If patient is at the pharmacy, call can be transferred to refill team.   1. Which medications need to be refilled? (please list name of each medication and dose if known) Metoprolol, Amiodarone  2. Which pharmacy/location (including street and city if local pharmacy) is medication to be sent to?[facility]  3. Do they need a 30 day or 90 day supply? [place]

## 2015-08-16 NOTE — Telephone Encounter (Signed)
Order History  Historical Medication    Date/Time Action Taken User Additional Information    07/18/11 1930 Historical Med Noted Hart [place], CPHT     07/28/11 [id number] Order for Admission Sinclair Grooms, MD [phone]    07/28/11 [phone] Resume at Discharge Sinclair Grooms, MD     12/03/12 1304 Resume at Discharge [name], MD     07/20/14 [place], [place]     02/24/15 1002 Taking [place]le [place], NP     03/26/15 [place], [place]     03/29/15 1431 Resume at Discharge [name], MD     05/06/15 1439 Taking [place], CMA       Medication Detail       Disp Refills Start End      metoprolol tartrate (LOPRESSOR) 25 MG tablet         Sig - Route: Take 25 mg by mouth 2 (two) times daily. - Oral     Class: Historical Med

## 2015-08-23 ENCOUNTER — Other Ambulatory Visit: Payer: Self-pay | Admitting: *Deleted

## 2015-08-23 MED ORDER — FUROSEMIDE 20 MG PO TABS: 10.0000 mg | ORAL_TABLET | Freq: Every day | ORAL | Status: DC | PRN

## 2015-08-27 DIAGNOSIS — N183 Chronic kidney disease, stage 3 (moderate): Secondary | ICD-10-CM | POA: Diagnosis not present

## 2015-08-27 DIAGNOSIS — E1142 Type 2 diabetes mellitus with diabetic polyneuropathy: Secondary | ICD-10-CM | POA: Diagnosis not present

## 2015-08-27 DIAGNOSIS — F331 Major depressive disorder, recurrent, moderate: Secondary | ICD-10-CM | POA: Diagnosis not present

## 2015-08-27 DIAGNOSIS — K5901 Slow transit constipation: Secondary | ICD-10-CM | POA: Diagnosis not present

## 2015-08-27 DIAGNOSIS — F5101 Primary insomnia: Secondary | ICD-10-CM | POA: Diagnosis not present

## 2015-08-27 DIAGNOSIS — Z794 Long term (current) use of insulin: Secondary | ICD-10-CM | POA: Diagnosis not present

## 2015-10-19 ENCOUNTER — Other Ambulatory Visit: Payer: Self-pay | Admitting: Interventional Cardiology

## 2015-11-17 DIAGNOSIS — B372 Candidiasis of skin and nail: Secondary | ICD-10-CM | POA: Diagnosis not present

## 2015-11-22 ENCOUNTER — Ambulatory Visit: Payer: Medicare Other | Admitting: Interventional Cardiology

## 2015-12-15 ENCOUNTER — Ambulatory Visit: Payer: Medicare Other | Admitting: Cardiology

## 2015-12-15 DIAGNOSIS — E119 Type 2 diabetes mellitus without complications: Secondary | ICD-10-CM | POA: Diagnosis not present

## 2015-12-15 DIAGNOSIS — Z7984 Long term (current) use of oral hypoglycemic drugs: Secondary | ICD-10-CM | POA: Diagnosis not present

## 2015-12-15 DIAGNOSIS — B3789 Other sites of candidiasis: Secondary | ICD-10-CM | POA: Diagnosis not present

## 2016-01-07 ENCOUNTER — Telehealth: Payer: Self-pay

## 2016-01-07 NOTE — Telephone Encounter (Signed)
Patient walked in office requesting to be seen.Stated she has been having intermittent chest pressure for the past 2 weeks.Stated she is having chest pressure at present,rates pressure # 4.Advised patient she needs to go to ER.Patient stated she cannot go today she is taking care of her 68 year old father.Appointment scheduled with Angelena Form PA 01/18/16 at 1:30 pm.Advised again she needs to ER.

## 2016-01-11 DIAGNOSIS — R5382 Chronic fatigue, unspecified: Secondary | ICD-10-CM | POA: Diagnosis not present

## 2016-01-11 DIAGNOSIS — Z0000 Encounter for general adult medical examination without abnormal findings: Secondary | ICD-10-CM | POA: Diagnosis not present

## 2016-01-11 DIAGNOSIS — F331 Major depressive disorder, recurrent, moderate: Secondary | ICD-10-CM | POA: Diagnosis not present

## 2016-01-11 DIAGNOSIS — E1142 Type 2 diabetes mellitus with diabetic polyneuropathy: Secondary | ICD-10-CM | POA: Diagnosis not present

## 2016-01-11 DIAGNOSIS — Z6836 Body mass index (BMI) 36.0-36.9, adult: Secondary | ICD-10-CM | POA: Diagnosis not present

## 2016-01-11 DIAGNOSIS — Z79899 Other long term (current) drug therapy: Secondary | ICD-10-CM | POA: Diagnosis not present

## 2016-01-11 DIAGNOSIS — E7800 Pure hypercholesterolemia, unspecified: Secondary | ICD-10-CM | POA: Diagnosis not present

## 2016-01-11 DIAGNOSIS — E1121 Type 2 diabetes mellitus with diabetic nephropathy: Secondary | ICD-10-CM | POA: Diagnosis not present

## 2016-01-11 DIAGNOSIS — Z23 Encounter for immunization: Secondary | ICD-10-CM | POA: Diagnosis not present

## 2016-01-11 DIAGNOSIS — N183 Chronic kidney disease, stage 3 (moderate): Secondary | ICD-10-CM | POA: Diagnosis not present

## 2016-01-11 DIAGNOSIS — E669 Obesity, unspecified: Secondary | ICD-10-CM | POA: Diagnosis not present

## 2016-01-11 DIAGNOSIS — Z7984 Long term (current) use of oral hypoglycemic drugs: Secondary | ICD-10-CM | POA: Diagnosis not present

## 2016-01-14 ENCOUNTER — Other Ambulatory Visit: Payer: Self-pay

## 2016-01-14 ENCOUNTER — Other Ambulatory Visit: Payer: Self-pay | Admitting: Interventional Cardiology

## 2016-01-14 MED ORDER — APIXABAN 5 MG PO TABS: 5.0000 mg | ORAL_TABLET | Freq: Two times a day (BID) | ORAL | 2 refills | Status: DC

## 2016-01-16 NOTE — Progress Notes (Signed)
Cardiology Office Note    Date:  01/18/2016   ID:  [name], DOB 05-22-1945, MRN [id number]  PCP:  Allison Argyle, MD  Cardiologist:  Dr. Tamala Julian   CC: CP/SOB  History of Present Illness:  [name] is a 70 y.o. female with a history of PAF on amio and Eliquis, DM, MVP, HLD, fibromyalgia, GERD and OSA who presents to clinic for evaluation of chest pain and SOB.    When she was 41 she had a cath which was clean but did have MVP.   She had a history of PAF and had a DCCV in 07/2011 and has maintained NSR since that time.   She was last seen by Allison Merle NP in 02/2015 for follow up. She was planning on having surgery to remove a large lipoma from thigh and surgery for achalasia. She was told to hold her Eliquis for 3 days prior to surgery. Amiodarone labs were ordered as well as PFTs which were normal. TSH was mildly elevated as well as AST and ALT. This was followed up by her PCP at [facility] and everything normalized.   Today she presents to clinic for follow up. She has had a lot of stress taking care of her 69 year old father. She has noticed a little chest pressure similar to when she had afib but "deeper." She notices chest pain worse a little when supine. Not related to exertion. No orthopnea or PND. NO LE edema unless she is up a lot (has PRN lasix 10mg  daily). No palpations, dizziness or syncope. She has felt her HR racing. This happened while she was getting an ECG today (and HR elevated into 90s and usually in 70s). No dypnea on exertion but she is not very active due to her fibromyalgia. 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   Her dad did have a MI in his 8s.   Past Medical History:  Diagnosis Date  . Complication of anesthesia 1975   projectile vomitting  . Diabetes mellitus   . Dyslipidemia   . Dysrhythmia    hx. A.Fib-Cardioversion-converted rhythm  . Fibromyalgia   . GERD (gastroesophageal reflux disease)   .  Hypertension   . MVP (mitral valve prolapse)   . OSA (obstructive sleep apnea) 10-18-12   no cpap used now.  . Vitamin D deficiency 10-18-12    Past Surgical History:  Procedure Laterality Date  . ABDOMINAL HYSTERECTOMY     '95  . BALLOON DILATION N/A 12/03/2012   Procedure: BALLOON DILATION;  Surgeon: Allison Fair, MD;  Location: [facility];  Service: Endoscopy;  Laterality: N/A;  . CARDIOVERSION  07/28/2011   Procedure: CARDIOVERSION;  Surgeon: Allison Grooms, MD;  Location: [facility];  Service: Cardiovascular;  Laterality: N/A;  . CATARACT EXTRACTION Left 10-18-12   10'13  . COLONOSCOPY WITH PROPOFOL N/A 03/29/2015   Procedure: COLONOSCOPY WITH PROPOFOL;  Surgeon: Allison Fair, MD;  Location: [facility];  Service: Endoscopy;  Laterality: N/A;  . ESOPHAGEAL MANOMETRY N/A 05/19/2013   Procedure: ESOPHAGEAL MANOMETRY (EM);  Surgeon: Allison Fair, MD;  Location: [facility];  Service: Endoscopy;  Laterality: N/A;  . ESOPHAGOGASTRODUODENOSCOPY (EGD) WITH PROPOFOL N/A 12/03/2012   Procedure: ESOPHAGOGASTRODUODENOSCOPY (EGD) WITH Dilation & w/PROPOFOL;  Surgeon: Allison Fair, MD;  Location: [facility];  Service: Endoscopy;  Laterality: N/A;  . THYROIDECTOMY  1981  . TUBAL LIGATION      Current  Medications: Outpatient Medications Prior to Visit  Medication Sig Dispense Refill  . acetaminophen (TYLENOL) 500 MG tablet Take 500 mg by mouth every 6 (six) hours as needed for mild pain or fever.     [name] amphetamine-dextroamphetamine (ADDERALL) 20 MG tablet Take 20 mg by mouth 3 (three) times daily.     [name] atorvastatin (LIPITOR) 80 MG tablet Take 80 mg by mouth at bedtime.     . BD PEN NEEDLE NANO U/F 32G X 4 MM MISC     . Cholecalciferol (VITAMIN D) 2000 UNITS tablet Take 2,000 Units by mouth daily.    . Dulaglutide (TRULICITY) 1.5 0000000 SOPN Inject 1.5 mg into the skin every 7 (seven) days. Thursdays    . DULoxetine (CYMBALTA) 60 MG capsule Take 60 mg by mouth 2 (two) times  daily.    [name] ELIQUIS 5 MG TABS tablet Take 5 mg by mouth 2 (two) times daily.     [name] esomeprazole (NEXIUM) 40 MG capsule Take 40 mg by mouth 2 (two) times daily.     . furosemide (LASIX) 20 MG tablet Take 0.5 tablets (10 mg total) by mouth daily as needed for fluid. 45 tablet 1  . insulin glargine (LANTUS) 100 UNIT/ML injection Inject 115 Units into the skin every morning.     [name] LORazepam (ATIVAN) 1 MG tablet Take 1 mg by mouth 2 (two) times daily as needed for anxiety.     . metoprolol tartrate (LOPRESSOR) 25 MG tablet Take 1 tablet (25 mg total) by mouth 2 (two) times daily. 60 tablet 3  . nitroGLYCERIN (NITROSTAT) 0.4 MG SL tablet Place 0.4 mg under the tongue every 5 (five) minutes as needed. For chest pain from esophageal spasms    . oxyCODONE (OXY IR/ROXICODONE) 5 MG immediate release tablet Take 5 mg by mouth every 6 (six) hours as needed (for breakthrough pain).    [name] oxyCODONE (OXYCONTIN) 40 MG 12 hr tablet Take 40 mg by mouth every 8 (eight) hours.     . polyethylene glycol (MIRALAX / GLYCOLAX) packet Take 17-34 g by mouth daily. Higher dose if she feels more constipated.    . pregabalin (LYRICA) 150 MG capsule Take 150-450 mg by mouth. 1 in the am and 3 in the pm    . amiodarone (PACERONE) 200 MG tablet Take 0.5 tablets (100 mg total) by mouth daily. 15 tablet 3  . apixaban (ELIQUIS) 5 MG TABS tablet Take 1 tablet (5 mg total) by mouth 2 (two) times daily. 180 tablet 2  . Canagliflozin (INVOKANA) 100 MG TABS Take 100 mg by mouth daily with breakfast.     No facility-administered medications prior to visit.      Allergies:   Iohexol; Morphine and related; Penicillins; Demerol; Victoza [liraglutide]; Dabigatran etexilate mesylate; and Latex   Social History   Social History  . Marital status: Married    Spouse name: N/A  . Number of children: N/A  . Years of education: N/A   Social History Main Topics  . Smoking status: Never Smoker  . Smokeless tobacco: Never Used  . Alcohol  use No  . Drug use: No  . Sexual activity: Yes   Other Topics Concern  . None   Social History Narrative  . None     Family History:  The patient's family history includes Atrial fibrillation in her father; Bronchitis in her father; Diabetes type II in her father; Heart disease in her father; Hypertension in her mother; Hyperthyroidism in her mother; Kidney  cancer in her sister.     ROS:   Please see the history of present illness.    ROS All other systems reviewed and are negative.   PHYSICAL EXAM:   VS:  BP 132/70   Pulse 92   Ht 5\' 5"  (1.651 m)   Wt 214 lb 1.9 oz (97.1 kg)   BMI 35.63 kg/m    GEN: Well nourished, well developed, in no acute distress  HEENT: normal  Neck: no JVD, carotid bruits, or masses Cardiac: RRR; no murmurs, rubs, or gallops,no edema  Respiratory:  clear to auscultation bilaterally, normal work of breathing GI: soft, nontender, nondistended, + BS MS: no deformity or atrophy  Skin: warm and dry, no rash Neuro:  Alert and Oriented x 3, Strength and sensation are intact Psych: euthymic mood, full affect  Wt Readings from Last 3 Encounters:  01/18/16 214 lb 1.9 oz (97.1 kg)  05/06/15 228 lb (103.4 kg)  03/29/15 221 lb (100.2 kg)      Studies/Labs Reviewed:   EKG:  EKG is ordered today.  The ekg ordered today demonstrates NSR HR 92  Recent Labs: 02/24/2015: ALT 51; BUN 20; Creat 1.21; Hemoglobin 15.6; Platelets 310; Potassium 4.5; Sodium 138; TSH 5.433   Lipid Panel No results found for: CHOL, TRIG, HDL, CHOLHDL, VLDL, LDLCALC, LDLDIRECT  Additional studies/ records that were reviewed today include:  Stress test 2000 FINDINGS CLINICAL DATA:  CHEST PAIN. MYOCARDIAL PERFUSION STUDY WITH SPECT EJECTION FRACTION ANALYSIS: WALL MOTION ANALYSIS: COMPARISON: December 04, 1996 TECHNIQUE: MYOCARDIAL PERFUSION STUDY WAS PERFORMED USING 10 mCi 73mTc SESTAMIBI AT REST AND 30 mCi 75mTc SESTAMIBI FOLLOWING PERSANTINE STRESS. FINDINGS: MYOCARDIAL  PERFUSION STUDY WITH SPECT: AGAIN DEMONSTRATED IS ANTERIOR AND APICAL DECREASED ACTIVITY, SECONDARY TO BREAST ATTENUATION. NO REGIONS OF DECREASED ACTIVITY SUSPICIOUS FOR SCAR OR ISCHEMIA ARE IDENTIFIED. EJECTION FRACTION ANALYSIS: MANUAL REDRAWING OF MYOCARDIAL CONTOUR WAS NECESSARY DUE TO INCREASED ADJACENT BOWEL ACTIVITY. END DIASTOLIC VOLUME IS ESTIMATED AT 127 ML. END SYSTOLIC VOLUME IS ESTIMATED AT 56 ML. CALCULATED EJECTION FRACTION OF 56%. WALL MOTION ANALYSIS: NO SIGNIFICANT WALL MOTION ABNORMALITIES ARE IDENTIFIED. IMPRESSION   ASSESSMENT & PLAN:   CP: atypical and not worse with exertion. Worse when she lays flat. She thinks its may be related to stress. Will get a 2D ECHO with hx of MVP and and lexiscan myoview since she does have RFs for CAD  PAF: maintaining NSR today. Continue Eliquis for CHADSVASC score of at least (HTN, age, DM, F sex). No blood in stool or urine.  Amiodarone surveillance: Amiodarone labs and PFTs were ordered in 02/2015. PFTs which were normal. TSH was mildly elevated as well as AST and ALT. This was followed up by her PCP at [facility] in 07/2015 but she does not remember results. Due every 6 months so we will repeat these and also get a CXR which she is overdue for.  HTN: BP well controlled on current regimen   Obesity: Body mass index is 35.63 kg/m.  DMT2: continue current regimen. Followed by Dr. Felipa Eth.   MVP: will repeat 2D ECHO   Medication Adjustments/Labs and Tests Ordered: Current medicines are reviewed at length with the patient today.  Concerns regarding medicines are outlined above.  Medication changes, Labs and Tests ordered today are listed in the Patient Instructions below. Patient Instructions  Medication Instructions:  Your physician recommends that you continue on your current medications as directed. Please refer to the Current Medication list given to you today.   Labwork: TODAY:  LFT &  TSH  Testing/Procedures: A chest x-ray  takes a picture of the organs and structures inside the chest, including the heart, lungs, and blood vessels. This test can show several things, including, whether the heart is enlarges; whether fluid is building up in the lungs; and whether pacemaker / defibrillator leads are still in place.  Your physician has requested that you have a lexiscan myoview. For further information please visit [email or web address]. Please follow instruction sheet, as given.   Your physician has requested that you have an echocardiogram. Echocardiography is a painless test that uses sound waves to create images of your heart. It provides your doctor with information about the size and shape of your heart and how well your heart's chambers and valves are working. This procedure takes approximately one hour. There are no restrictions for this procedure.    Follow-Up: Your physician wants you to follow-up in: [place] DR. Gaspar Bidding will receive a reminder letter in the mail two months in advance. If you don't receive a letter, please call our office to schedule the follow-up appointment.   Any Other Special Instructions Will Be Listed Below (If Applicable).  Pharmacologic Stress Electrocardiogram A pharmacologic stress electrocardiogram is a heart (cardiac) test that uses nuclear imaging to evaluate the blood supply to your heart. This test may also be called a pharmacologic stress electrocardiography. Pharmacologic means that a medicine is used to increase your heart rate and blood pressure.  This stress test is done to find areas of poor blood flow to the heart by determining the extent of coronary artery disease (CAD). Some people exercise on a treadmill, which naturally increases the blood flow to the heart. For those people unable to exercise on a treadmill, a medicine is used. This medicine stimulates your heart and will cause your heart to beat harder and more quickly, as if you were exercising.  Pharmacologic  stress tests can help determine:  The adequacy of blood flow to your heart during increased levels of activity in order to clear you for discharge home.  The extent of coronary artery blockage caused by CAD.  Your prognosis if you have suffered a heart attack.  The effectiveness of cardiac procedures done, such as an angioplasty, which can increase the circulation in your coronary arteries.  Causes of chest pain or pressure. LET [facility] CARE PROVIDER KNOW ABOUT:  Any allergies you have.  All medicines you are taking, including vitamins, herbs, eye drops, creams, and over-the-counter medicines.  Previous problems you or members of your family have had with the use of anesthetics.  Any blood disorders you have.  Previous surgeries you have had.  Medical conditions you have.  Possibility of pregnancy, if this applies.  If you are currently breastfeeding. RISKS AND COMPLICATIONS Generally, this is a safe procedure. However, as with any procedure, complications can occur. Possible complications include:  You develop pain or pressure in the following areas:  Chest.  Jaw or neck.  Between your shoulder blades.  Radiating down your left arm.  Headache.  Dizziness or light-headedness.  Shortness of breath.  Increased or irregular heartbeat.  Low blood pressure.  Nausea or vomiting.  Flushing.  Redness going up the arm and slight pain during injection of medicine.  Heart attack (rare). BEFORE THE PROCEDURE   Avoid all forms of caffeine for 24 hours before your test or as directed by your health care provider. This includes coffee, tea (even decaffeinated tea), caffeinated sodas, chocolate, cocoa, and certain pain medicines.  Follow your health care provider's instructions regarding eating and drinking before the test.  Take your medicines as directed at regular times with water unless instructed otherwise. Exceptions may include:  If you have diabetes, ask  how you are to take your insulin or pills. It is common to adjust insulin dosing the morning of the test.  If you are taking beta-blocker medicines, it is important to talk to your health care provider about these medicines well before the date of your test. Taking beta-blocker medicines may interfere with the test. In some cases, these medicines need to be changed or stopped 24 hours or more before the test.  If you wear a nitroglycerin patch, it may need to be removed prior to the test. Ask your health care provider if the patch should be removed before the test.  If you use an inhaler for any breathing condition, bring it with you to the test.  If you are an outpatient, bring a snack so you can eat right after the stress phase of the test.  Do not smoke for 4 hours prior to the test or as directed by your health care provider.  Do not apply lotions, powders, creams, or oils on your chest prior to the test.  Wear comfortable shoes and clothing. Let your health care provider know if you were unable to complete or follow the preparations for your test. PROCEDURE   Multiple patches (electrodes) will be put on your chest. If needed, small areas of your chest may be shaved to get better contact with the electrodes. Once the electrodes are attached to your body, multiple wires will be attached to the electrodes, and your heart rate will be monitored.  An IV access will be started. A nuclear trace (isotope) is given. The isotope may be given intravenously, or it may be swallowed. Nuclear refers to several types of radioactive isotopes, and the nuclear isotope lights up the arteries so that the nuclear images are clear. The isotope is absorbed by your body. This results in low radiation exposure.  A resting nuclear image is taken to show how your heart functions at rest.  A medicine is given through the IV access.  A second scan is done about 1 hour after the medicine injection and determines how  your heart functions under stress.  During this stress phase, you will be connected to an electrocardiogram machine. Your blood pressure and oxygen levels will be monitored. AFTER THE PROCEDURE   Your heart rate and blood pressure will be monitored after the test.  You may return to your normal schedule, including diet,activities, and medicines, unless your health care provider tells you otherwise.   This information is not intended to replace advice given to you by your health care provider. Make sure you discuss any questions you have with your health care provider.   Document Released: 08/27/2008 Document Revised: 04/15/2013 Document Reviewed: 12/16/2012 Elsevier Interactive Patient Education 2016 [employer].   Echocardiogram An echocardiogram, or echocardiography, uses sound waves (ultrasound) to produce an image of your heart. The echocardiogram is simple, painless, obtained within a short period of time, and offers valuable information to your health care provider. The images from an echocardiogram can provide information such as:  Evidence of coronary artery disease (CAD).  Heart size.  Heart muscle function.  Heart valve function.  Aneurysm detection.  Evidence of a past heart attack.  Fluid buildup around the heart.  Heart muscle thickening.  Assess heart valve function. LET YOUR  HEALTH CARE PROVIDER KNOW ABOUT:  Any allergies you have.  All medicines you are taking, including vitamins, herbs, eye drops, creams, and over-the-counter medicines.  Previous problems you or members of your family have had with the use of anesthetics.  Any blood disorders you have.  Previous surgeries you have had.  Medical conditions you have.  Possibility of pregnancy, if this applies. BEFORE THE PROCEDURE  No special preparation is needed. Eat and drink normally.  PROCEDURE   In order to produce an image of your heart, gel will be applied to your chest and a wand-like  tool (transducer) will be moved over your chest. The gel will help transmit the sound waves from the transducer. The sound waves will harmlessly bounce off your heart to allow the heart images to be captured in real-time motion. These images will then be recorded.  You may need an IV to receive a medicine that improves the quality of the pictures. AFTER THE PROCEDURE You may return to your normal schedule including diet, activities, and medicines, unless your health care provider tells you otherwise.   This information is not intended to replace advice given to you by your health care provider. Make sure you discuss any questions you have with your health care provider.   Document Released: 04/07/2000 Document Revised: 05/01/2014 Document Reviewed: 12/16/2012 Elsevier Interactive Patient Education [employer].  If you need a refill on your cardiac medications before your next appointment, please call your pharmacy.      Signed, Angelena Form, PA-C  01/18/2016 3:52 PM    [facility] [place], [place], [place]  [address] Phone: [phone]; Fax: [phone]

## 2016-01-18 ENCOUNTER — Ambulatory Visit
Admission: RE | Admit: 2016-01-18 | Discharge: 2016-01-18 | Disposition: A | Discharge status: Home / Self Care | Payer: Medicare Other | Source: Ambulatory Visit | Attending: Physician Assistant | Admitting: Physician Assistant

## 2016-01-18 ENCOUNTER — Encounter (INDEPENDENT_AMBULATORY_CARE_PROVIDER_SITE_OTHER): Payer: Self-pay

## 2016-01-18 ENCOUNTER — Encounter: Payer: Self-pay | Admitting: Physician Assistant

## 2016-01-18 ENCOUNTER — Ambulatory Visit (INDEPENDENT_AMBULATORY_CARE_PROVIDER_SITE_OTHER): Payer: Medicare Other | Admitting: Physician Assistant

## 2016-01-18 VITALS — BP 132/70 | HR 92 | Ht 65.0 in | Wt 214.1 lb

## 2016-01-18 DIAGNOSIS — I482 Chronic atrial fibrillation, unspecified: Secondary | ICD-10-CM

## 2016-01-18 DIAGNOSIS — Z79899 Other long term (current) drug therapy: Secondary | ICD-10-CM

## 2016-01-18 DIAGNOSIS — I10 Essential (primary) hypertension: Secondary | ICD-10-CM

## 2016-01-18 DIAGNOSIS — I480 Paroxysmal atrial fibrillation: Secondary | ICD-10-CM

## 2016-01-18 DIAGNOSIS — R079 Chest pain, unspecified: Secondary | ICD-10-CM

## 2016-01-18 DIAGNOSIS — I341 Nonrheumatic mitral (valve) prolapse: Secondary | ICD-10-CM

## 2016-01-18 LAB — HEPATIC FUNCTION PANEL
ALT: 20 U/L (ref 6–29)
AST: 25 U/L (ref 10–35)
Albumin: 3.8 g/dL (ref 3.6–5.1)
Alkaline Phosphatase: 92 U/L (ref 33–130)
Bilirubin, Direct: 0.1 mg/dL (ref <=0.2)
Indirect Bilirubin: 0.3 mg/dL (ref 0.2–1.2)
Total Bilirubin: 0.4 mg/dL (ref 0.2–1.2)
Total Protein: 6.4 g/dL (ref 6.1–8.1)

## 2016-01-18 LAB — TSH: TSH: 2.62 mIU/L

## 2016-01-18 MED ORDER — AMIODARONE HCL 200 MG PO TABS: 100.0000 mg | ORAL_TABLET | Freq: Every day | ORAL | 3 refills | Status: DC

## 2016-01-18 NOTE — Patient Instructions (Signed)
Medication Instructions:  Your physician recommends that you continue on your current medications as directed. Please refer to the Current Medication list given to you today.   Labwork: TODAY:  LFT & TSH  Testing/Procedures: A chest x-ray takes a picture of the organs and structures inside the chest, including the heart, lungs, and blood vessels. This test can show several things, including, whether the heart is enlarges; whether fluid is building up in the lungs; and whether pacemaker / defibrillator leads are still in place.  Your physician has requested that you have a lexiscan myoview. For further information please visit [email or web address]. Please follow instruction sheet, as given.   Your physician has requested that you have an echocardiogram. Echocardiography is a painless test that uses sound waves to create images of your heart. It provides your doctor with information about the size and shape of your heart and how well your heart's chambers and valves are working. This procedure takes approximately one hour. There are no restrictions for this procedure.    Follow-Up: Your physician wants you to follow-up in: [place] DR. Gaspar Bidding will receive a reminder letter in the mail two months in advance. If you don't receive a letter, please call our office to schedule the follow-up appointment.   Any Other Special Instructions Will Be Listed Below (If Applicable).  Pharmacologic Stress Electrocardiogram A pharmacologic stress electrocardiogram is a heart (cardiac) test that uses nuclear imaging to evaluate the blood supply to your heart. This test may also be called a pharmacologic stress electrocardiography. Pharmacologic means that a medicine is used to increase your heart rate and blood pressure.  This stress test is done to find areas of poor blood flow to the heart by determining the extent of coronary artery disease (CAD). Some people exercise on a treadmill, which naturally  increases the blood flow to the heart. For those people unable to exercise on a treadmill, a medicine is used. This medicine stimulates your heart and will cause your heart to beat harder and more quickly, as if you were exercising.  Pharmacologic stress tests can help determine:  The adequacy of blood flow to your heart during increased levels of activity in order to clear you for discharge home.  The extent of coronary artery blockage caused by CAD.  Your prognosis if you have suffered a heart attack.  The effectiveness of cardiac procedures done, such as an angioplasty, which can increase the circulation in your coronary arteries.  Causes of chest pain or pressure. LET [facility] CARE PROVIDER KNOW ABOUT:  Any allergies you have.  All medicines you are taking, including vitamins, herbs, eye drops, creams, and over-the-counter medicines.  Previous problems you or members of your family have had with the use of anesthetics.  Any blood disorders you have.  Previous surgeries you have had.  Medical conditions you have.  Possibility of pregnancy, if this applies.  If you are currently breastfeeding. RISKS AND COMPLICATIONS Generally, this is a safe procedure. However, as with any procedure, complications can occur. Possible complications include:  You develop pain or pressure in the following areas:  Chest.  Jaw or neck.  Between your shoulder blades.  Radiating down your left arm.  Headache.  Dizziness or light-headedness.  Shortness of breath.  Increased or irregular heartbeat.  Low blood pressure.  Nausea or vomiting.  Flushing.  Redness going up the arm and slight pain during injection of medicine.  Heart attack (rare). BEFORE THE PROCEDURE   Avoid all forms  of caffeine for 24 hours before your test or as directed by your health care provider. This includes coffee, tea (even decaffeinated tea), caffeinated sodas, chocolate, cocoa, and certain pain  medicines.  Follow your health care provider's instructions regarding eating and drinking before the test.  Take your medicines as directed at regular times with water unless instructed otherwise. Exceptions may include:  If you have diabetes, ask how you are to take your insulin or pills. It is common to adjust insulin dosing the morning of the test.  If you are taking beta-blocker medicines, it is important to talk to your health care provider about these medicines well before the date of your test. Taking beta-blocker medicines may interfere with the test. In some cases, these medicines need to be changed or stopped 24 hours or more before the test.  If you wear a nitroglycerin patch, it may need to be removed prior to the test. Ask your health care provider if the patch should be removed before the test.  If you use an inhaler for any breathing condition, bring it with you to the test.  If you are an outpatient, bring a snack so you can eat right after the stress phase of the test.  Do not smoke for 4 hours prior to the test or as directed by your health care provider.  Do not apply lotions, powders, creams, or oils on your chest prior to the test.  Wear comfortable shoes and clothing. Let your health care provider know if you were unable to complete or follow the preparations for your test. PROCEDURE   Multiple patches (electrodes) will be put on your chest. If needed, small areas of your chest may be shaved to get better contact with the electrodes. Once the electrodes are attached to your body, multiple wires will be attached to the electrodes, and your heart rate will be monitored.  An IV access will be started. A nuclear trace (isotope) is given. The isotope may be given intravenously, or it may be swallowed. Nuclear refers to several types of radioactive isotopes, and the nuclear isotope lights up the arteries so that the nuclear images are clear. The isotope is absorbed by your  body. This results in low radiation exposure.  A resting nuclear image is taken to show how your heart functions at rest.  A medicine is given through the IV access.  A second scan is done about 1 hour after the medicine injection and determines how your heart functions under stress.  During this stress phase, you will be connected to an electrocardiogram machine. Your blood pressure and oxygen levels will be monitored. AFTER THE PROCEDURE   Your heart rate and blood pressure will be monitored after the test.  You may return to your normal schedule, including diet,activities, and medicines, unless your health care provider tells you otherwise.   This information is not intended to replace advice given to you by your health care provider. Make sure you discuss any questions you have with your health care provider.   Document Released: 08/27/2008 Document Revised: 04/15/2013 Document Reviewed: 12/16/2012 Elsevier Interactive Patient Education 2016 [employer].   Echocardiogram An echocardiogram, or echocardiography, uses sound waves (ultrasound) to produce an image of your heart. The echocardiogram is simple, painless, obtained within a short period of time, and offers valuable information to your health care provider. The images from an echocardiogram can provide information such as:  Evidence of coronary artery disease (CAD).  Heart size.  Heart muscle  function.  Heart valve function.  Aneurysm detection.  Evidence of a past heart attack.  Fluid buildup around the heart.  Heart muscle thickening.  Assess heart valve function. LET [facility] CARE PROVIDER KNOW ABOUT:  Any allergies you have.  All medicines you are taking, including vitamins, herbs, eye drops, creams, and over-the-counter medicines.  Previous problems you or members of your family have had with the use of anesthetics.  Any blood disorders you have.  Previous surgeries you have had.  Medical  conditions you have.  Possibility of pregnancy, if this applies. BEFORE THE PROCEDURE  No special preparation is needed. Eat and drink normally.  PROCEDURE   In order to produce an image of your heart, gel will be applied to your chest and a wand-like tool (transducer) will be moved over your chest. The gel will help transmit the sound waves from the transducer. The sound waves will harmlessly bounce off your heart to allow the heart images to be captured in real-time motion. These images will then be recorded.  You may need an IV to receive a medicine that improves the quality of the pictures. AFTER THE PROCEDURE You may return to your normal schedule including diet, activities, and medicines, unless your health care provider tells you otherwise.   This information is not intended to replace advice given to you by your health care provider. Make sure you discuss any questions you have with your health care provider.   Document Released: 04/07/2000 Document Revised: 05/01/2014 Document Reviewed: 12/16/2012 Elsevier Interactive Patient Education [employer].  If you need a refill on your cardiac medications before your next appointment, please call your pharmacy.

## 2016-01-31 DIAGNOSIS — L239 Allergic contact dermatitis, unspecified cause: Secondary | ICD-10-CM | POA: Diagnosis not present

## 2016-01-31 DIAGNOSIS — L304 Erythema intertrigo: Secondary | ICD-10-CM | POA: Diagnosis not present

## 2016-02-03 ENCOUNTER — Telehealth: Payer: Self-pay | Admitting: Interventional Cardiology

## 2016-02-03 ENCOUNTER — Other Ambulatory Visit: Payer: Self-pay | Admitting: Physician Assistant

## 2016-02-03 DIAGNOSIS — I480 Paroxysmal atrial fibrillation: Secondary | ICD-10-CM

## 2016-02-03 MED ORDER — AMIODARONE HCL 200 MG PO TABS: 100.0000 mg | ORAL_TABLET | Freq: Every day | ORAL | 3 refills | Status: DC

## 2016-02-03 NOTE — Telephone Encounter (Signed)
Mrs. Erling is calling because she is still having chest pressure and she had to take nitro and after the third tablet it seem to ease off .

## 2016-02-03 NOTE — Telephone Encounter (Signed)
Patient called to request Amiodarone refills sent in. When speaking with the scheduler, she reported she had an episode of CP 2 days ago and call was put through to triage.  Patient reports 2 days ago, she lay down to go to sleep and experienced "more chest pressure than usual." She took 2 SL NTG and the pain subsided.  She has been asymptomatic ever since.   Patient has ECHO and Myoview scheduled 10/19. Checked with [facility]- there are no availabilities to move testing sooner.  To Angelena Form for further recommendations.

## 2016-02-03 NOTE — Telephone Encounter (Signed)
I think as long as it is gone, we are okay to wait for the stress test. If it worsens or doesn't resolve with SL NTG, she should go to ER.

## 2016-02-03 NOTE — Telephone Encounter (Signed)
Called pt to inform her that her Rx has been sent to her pharmacy as requested. Confirmation received.

## 2016-02-03 NOTE — Telephone Encounter (Signed)
°*  STAT* If patient is at the pharmacy, call can be transferred to refill team.   1. Which medications need to be refilled? (please list name of each medication and dose if known) Amiodarone 200mg   2. Which pharmacy/location (including street and city if local pharmacy) is medication to be sent to?[facility] . [phone]  3. Do they need a 30 day or 90 day supply?[place]

## 2016-02-03 NOTE — Telephone Encounter (Signed)
Spoke with pt and informed her of recommendations per Bonney Leitz, PA-C.  Pt verbalized understanding and was in agreement with this plan.

## 2016-02-08 ENCOUNTER — Telehealth (HOSPITAL_COMMUNITY): Payer: Self-pay | Admitting: *Deleted

## 2016-02-08 NOTE — Telephone Encounter (Signed)
Patient given detailed instructions per Myocardial Perfusion Study Information Sheet for the test on 02/10/16. Patient notified to arrive 15 minutes early and that it is imperative to arrive on time for appointment to keep from having the test rescheduled.  If you need to cancel or reschedule your appointment, please call the office within 24 hours of your appointment. Failure to do so may result in a cancellation of your appointment, and a $50 no show fee. Patient verbalized understanding.  Kirstie Peri

## 2016-02-10 ENCOUNTER — Ambulatory Visit (HOSPITAL_COMMUNITY): Payer: Medicare Other | Attending: Cardiovascular Disease

## 2016-02-10 ENCOUNTER — Other Ambulatory Visit: Payer: Self-pay

## 2016-02-10 ENCOUNTER — Ambulatory Visit (HOSPITAL_BASED_OUTPATIENT_CLINIC_OR_DEPARTMENT_OTHER): Payer: Medicare Other

## 2016-02-10 DIAGNOSIS — R079 Chest pain, unspecified: Secondary | ICD-10-CM

## 2016-02-10 DIAGNOSIS — L2084 Intrinsic (allergic) eczema: Secondary | ICD-10-CM | POA: Diagnosis not present

## 2016-02-10 DIAGNOSIS — L309 Dermatitis, unspecified: Secondary | ICD-10-CM | POA: Diagnosis not present

## 2016-02-10 LAB — ECHOCARDIOGRAM COMPLETE
Height: 65 in
Weight: 3424 oz

## 2016-02-10 MED ORDER — TECHNETIUM TC 99M TETROFOSMIN IV KIT
32.9000 | PACK | Freq: Once | INTRAVENOUS | Status: AC | PRN
  Administered 2016-02-10: 32.9 via INTRAVENOUS
  Filled 2016-02-10: qty 33

## 2016-02-10 MED ORDER — REGADENOSON 0.4 MG/5ML IV SOLN
0.4000 mg | Freq: Once | INTRAVENOUS | Status: AC
  Administered 2016-02-10: 0.4 mg via INTRAVENOUS

## 2016-02-11 ENCOUNTER — Ambulatory Visit (HOSPITAL_COMMUNITY): Payer: Medicare Other

## 2016-02-14 ENCOUNTER — Ambulatory Visit (HOSPITAL_COMMUNITY): Payer: Medicare Other

## 2016-02-16 ENCOUNTER — Ambulatory Visit (HOSPITAL_COMMUNITY): Payer: Medicare Other | Attending: Cardiovascular Disease

## 2016-02-16 LAB — MYOCARDIAL PERFUSION IMAGING
LV dias vol: 99 mL (ref 46–106)
LV sys vol: 36 mL
Peak HR: 73 {beats}/min
RATE: 0.27
Rest HR: 62 {beats}/min
SDS: 0
SRS: 5
SSS: 0
TID: 1.01

## 2016-02-16 MED ORDER — TECHNETIUM TC 99M TETROFOSMIN IV KIT
32.3000 | PACK | Freq: Once | INTRAVENOUS | Status: AC | PRN
  Administered 2016-02-16: 32.3 via INTRAVENOUS
  Filled 2016-02-16: qty 33

## 2016-02-18 DIAGNOSIS — R1011 Right upper quadrant pain: Secondary | ICD-10-CM | POA: Diagnosis not present

## 2016-02-25 ENCOUNTER — Encounter: Payer: Self-pay | Admitting: *Deleted

## 2016-03-01 ENCOUNTER — Other Ambulatory Visit: Payer: Self-pay | Admitting: Gastroenterology

## 2016-03-08 ENCOUNTER — Telehealth: Payer: Self-pay | Admitting: Interventional Cardiology

## 2016-03-08 NOTE — Telephone Encounter (Signed)
Advised pt of stress test and echo results. Pt verbalized understanding.  Scheduled 6 mo ov fu with Dr. Tamala Julian.

## 2016-03-08 NOTE — Telephone Encounter (Signed)
New Message  Pt voiced wanting nurse to call about results.  Please f/u

## 2016-03-22 DIAGNOSIS — L309 Dermatitis, unspecified: Secondary | ICD-10-CM | POA: Diagnosis not present

## 2016-03-22 DIAGNOSIS — M1812 Unilateral primary osteoarthritis of first carpometacarpal joint, left hand: Secondary | ICD-10-CM | POA: Diagnosis not present

## 2016-03-22 DIAGNOSIS — M7711 Lateral epicondylitis, right elbow: Secondary | ICD-10-CM | POA: Diagnosis not present

## 2016-03-27 DIAGNOSIS — N183 Chronic kidney disease, stage 3 (moderate): Secondary | ICD-10-CM | POA: Diagnosis not present

## 2016-03-27 DIAGNOSIS — E1142 Type 2 diabetes mellitus with diabetic polyneuropathy: Secondary | ICD-10-CM | POA: Diagnosis not present

## 2016-03-27 DIAGNOSIS — Z794 Long term (current) use of insulin: Secondary | ICD-10-CM | POA: Diagnosis not present

## 2016-03-27 DIAGNOSIS — M21619 Bunion of unspecified foot: Secondary | ICD-10-CM | POA: Diagnosis not present

## 2016-04-13 ENCOUNTER — Telehealth: Payer: Self-pay | Admitting: Interventional Cardiology

## 2016-04-13 NOTE — Telephone Encounter (Signed)
Fax received.  Forms requesting medical records.  Paperwork given to [employer] in medical records so records can be forwarded to requesting office.

## 2016-04-13 NOTE — Telephone Encounter (Signed)
Follow up   Calling to follow up on clearance form faxed. Please fax to [phone] Attn. Anderson Malta Please call if there is a problem.

## 2016-04-13 NOTE — Telephone Encounter (Signed)
Spoke with Anderson Malta and advised her that fax has not been received.  Verified fax number she was sending to and she will try to fax again.  Provided alternate fax number incase of any issues.

## 2016-04-27 ENCOUNTER — Encounter (HOSPITAL_COMMUNITY): Payer: Self-pay

## 2016-05-02 ENCOUNTER — Ambulatory Visit (HOSPITAL_COMMUNITY): Admit: 2016-05-02 | Payer: Medicare Other | Admitting: Gastroenterology

## 2016-05-02 ENCOUNTER — Encounter (HOSPITAL_COMMUNITY): Payer: Self-pay

## 2016-05-02 HISTORY — DX: Other specified postprocedural states: R11.2

## 2016-05-02 HISTORY — DX: Nausea with vomiting, unspecified: R11.2

## 2016-05-02 HISTORY — DX: Other specified postprocedural states: Z98.890

## 2016-05-02 SURGERY — COLONOSCOPY WITH PROPOFOL
Anesthesia: Monitor Anesthesia Care

## 2016-05-05 ENCOUNTER — Other Ambulatory Visit: Payer: Self-pay | Admitting: Gastroenterology

## 2016-05-09 ENCOUNTER — Telehealth: Payer: Self-pay | Admitting: Interventional Cardiology

## 2016-05-09 NOTE — Telephone Encounter (Signed)
This must be on our letter head. Request for surgical clearance:  1. What type of surgery is being performed? Extraction of four teeth and bone grafting   2. When is this surgery scheduled? Have not been scheduled   3. Are there any medications that need to be held prior to surgery Can  the pt stop her Eliqius is  before surgery?If so,how many days before?  4. Name of physician performing surgery? Dr San Jetty   5. What is your office phone and fax number? [phone] and fax is [phone]

## 2016-05-10 NOTE — Telephone Encounter (Signed)
I have not seen her in ~ 2 years. Needs f/u to be cleared.

## 2016-05-12 NOTE — Telephone Encounter (Signed)
Left message to call back  

## 2016-05-15 NOTE — Telephone Encounter (Signed)
Left message to call back  

## 2016-05-17 NOTE — Telephone Encounter (Signed)
Left message to call back' Pt can have 10:45AM on 05/19/16 or 1:45PM on 06/02/16.

## 2016-05-25 ENCOUNTER — Other Ambulatory Visit: Payer: Self-pay | Admitting: Geriatric Medicine

## 2016-05-25 DIAGNOSIS — Z1231 Encounter for screening mammogram for malignant neoplasm of breast: Secondary | ICD-10-CM

## 2016-05-25 NOTE — Telephone Encounter (Signed)
Spoke with pt and scheduled her to see Dr. Tamala Julian 06/02/16.

## 2016-05-29 DIAGNOSIS — E119 Type 2 diabetes mellitus without complications: Secondary | ICD-10-CM | POA: Diagnosis not present

## 2016-05-29 DIAGNOSIS — H26492 Other secondary cataract, left eye: Secondary | ICD-10-CM | POA: Diagnosis not present

## 2016-05-29 DIAGNOSIS — H2511 Age-related nuclear cataract, right eye: Secondary | ICD-10-CM | POA: Diagnosis not present

## 2016-05-29 DIAGNOSIS — Z794 Long term (current) use of insulin: Secondary | ICD-10-CM | POA: Diagnosis not present

## 2016-06-01 NOTE — Progress Notes (Deleted)
Cardiology Office Note    Date:  06/01/2016   ID:  Batool, Reasonover August 11, 1945, MRN [id number]  PCP:  Mathews Argyle, MD  Cardiologist: Sinclair Grooms, MD   No chief complaint on file.   History of Present Illness:  [name] is a 71 y.o. female with a history of PAF on amio and Eliquis, DM, MVP, HLD, fibromyalgia, GERD and OSA who presents to clinic for evaluation of chest pain and SOB.      Past Medical History:  Diagnosis Date  . Complication of anesthesia 1975   projectile vomitting  . Diabetes mellitus   . Dyslipidemia   . Dysrhythmia    hx. A.Fib-Cardioversion-converted rhythm  . Fibromyalgia   . GERD (gastroesophageal reflux disease)   . Hypertension   . MVP (mitral valve prolapse)   . OSA (obstructive sleep apnea) 10-18-12   no cpap used now.  [name] PONV (postoperative nausea and vomiting)   . Vitamin D deficiency 10-18-12    Past Surgical History:  Procedure Laterality Date  . ABDOMINAL HYSTERECTOMY     '95  . BALLOON DILATION N/A 12/03/2012   Procedure: BALLOON DILATION;  Surgeon: [name], MD;  Location: [facility];  Service: Endoscopy;  Laterality: N/A;  . CARDIOVERSION  07/28/2011   Procedure: CARDIOVERSION;  Surgeon: Sinclair Grooms, MD;  Location: [facility];  Service: Cardiovascular;  Laterality: N/A;  . CATARACT EXTRACTION Left 10-18-12   10'13  . COLONOSCOPY WITH PROPOFOL N/A 03/29/2015   Procedure: COLONOSCOPY WITH PROPOFOL;  Surgeon: [name], MD;  Location: [facility];  Service: Endoscopy;  Laterality: N/A;  . ESOPHAGEAL MANOMETRY N/A 05/19/2013   Procedure: ESOPHAGEAL MANOMETRY (EM);  Surgeon: [name], MD;  Location: [facility];  Service: Endoscopy;  Laterality: N/A;  . ESOPHAGOGASTRODUODENOSCOPY (EGD) WITH PROPOFOL N/A 12/03/2012   Procedure: ESOPHAGOGASTRODUODENOSCOPY (EGD) WITH Dilation & w/PROPOFOL;  Surgeon: [name], MD;  Location: [facility];  Service: Endoscopy;  Laterality: N/A;  .  THYROIDECTOMY  1981  . TUBAL LIGATION      Current Medications: Outpatient Medications Prior to Visit  Medication Sig Dispense Refill  . acetaminophen (TYLENOL) 500 MG tablet Take 500 mg by mouth every 6 (six) hours as needed for mild pain or fever.     [name] amiodarone (PACERONE) 200 MG tablet Take 0.5 tablets (100 mg total) by mouth daily. 45 tablet 3  . amphetamine-dextroamphetamine (ADDERALL) 20 MG tablet Take 20 mg by mouth 3 (three) times daily.     [name] atorvastatin (LIPITOR) 80 MG tablet Take 80 mg by mouth at bedtime.     . BD PEN NEEDLE NANO U/F 32G X 4 MM MISC     . Cholecalciferol (VITAMIN D) 2000 UNITS tablet Take 2,000 Units by mouth daily.    . Dulaglutide (TRULICITY) 1.5 0000000 SOPN Inject 1.5 mg into the skin every 7 (seven) days. Thursdays    . DULoxetine (CYMBALTA) 60 MG capsule Take 60 mg by mouth 2 (two) times daily.    [name] ELIQUIS 5 MG TABS tablet Take 5 mg by mouth 2 (two) times daily.     [name] esomeprazole (NEXIUM) 40 MG capsule Take 40 mg by mouth 2 (two) times daily.     . furosemide (LASIX) 20 MG tablet Take 0.5 tablets (10 mg total) by mouth daily as needed for fluid. 45 tablet 1  . insulin glargine (LANTUS) 100 UNIT/ML injection Inject 115 Units into the skin every morning.     [name]  LORazepam (ATIVAN) 1 MG tablet Take 1 mg by mouth 2 (two) times daily as needed for anxiety.     . metoprolol tartrate (LOPRESSOR) 25 MG tablet Take 1 tablet (25 mg total) by mouth 2 (two) times daily. 60 tablet 3  . nitroGLYCERIN (NITROSTAT) 0.4 MG SL tablet Place 0.4 mg under the tongue every 5 (five) minutes as needed. For chest pain from esophageal spasms    . oxyCODONE (OXY IR/ROXICODONE) 5 MG immediate release tablet Take 5 mg by mouth every 6 (six) hours as needed (for breakthrough pain).    [name] oxyCODONE (OXYCONTIN) 40 MG 12 hr tablet Take 40 mg by mouth every 8 (eight) hours.     . polyethylene glycol (MIRALAX / GLYCOLAX) packet Take 17-34 g by mouth daily. Higher dose if she feels more  constipated.    . pregabalin (LYRICA) 150 MG capsule Take 150-450 mg by mouth. 1 in the am and 3 in the pm     No facility-administered medications prior to visit.      Allergies:   Iohexol; Morphine and related; Penicillins; Demerol; Victoza [liraglutide]; Dabigatran etexilate mesylate; and Latex   Social History   Social History  . Marital status: Married    Spouse name: N/A  . Number of children: N/A  . Years of education: N/A   Social History Main Topics  . Smoking status: Never Smoker  . Smokeless tobacco: Never Used  . Alcohol use No  . Drug use: No  . Sexual activity: Yes   Other Topics Concern  . Not on file   Social History Narrative  . No narrative on file     Family History:  The patient's ***family history includes Atrial fibrillation in her father; Bronchitis in her father; Diabetes type II in her father; Heart disease in her father; Hypertension in her mother; Hyperthyroidism in her mother; Kidney cancer in her sister.   ROS:   Please see the history of present illness.    ***  All other systems reviewed and are negative.   PHYSICAL EXAM:   VS:  There were no vitals taken for this visit.   GEN: Well nourished, well developed, in no acute distress  HEENT: normal  Neck: no JVD, carotid bruits, or masses Cardiac: ***RRR; no murmurs, rubs, or gallops,no edema  Respiratory:  clear to auscultation bilaterally, normal work of breathing GI: soft, nontender, nondistended, + BS MS: no deformity or atrophy  Skin: warm and dry, no rash Neuro:  Alert and Oriented x 3, Strength and sensation are intact Psych: euthymic mood, full affect  Wt Readings from Last 3 Encounters:  02/10/16 214 lb (97.1 kg)  01/18/16 214 lb 1.9 oz (97.1 kg)  05/06/15 228 lb (103.4 kg)      Studies/Labs Reviewed:   EKG:  EKG  ***  Recent Labs: 01/18/2016: ALT 20; TSH 2.62   Lipid Panel No results found for: CHOL, TRIG, HDL, CHOLHDL, VLDL, LDLCALC, LDLDIRECT  Additional  studies/ records that were reviewed today include:  ***    ASSESSMENT:    1. Paroxysmal atrial fibrillation (HCC)   2. Essential hypertension   3. Obstructive sleep apnea   4. Mitral valve disorder   5. Anticoagulation goal of INR 2 to 3   6. Combined hyperlipidemia      PLAN:  In order of problems listed above:  1. ***    Medication Adjustments/Labs and Tests Ordered: Current medicines are reviewed at length with the patient today.  Concerns regarding medicines are  outlined above.  Medication changes, Labs and Tests ordered today are listed in the Patient Instructions below. There are no Patient Instructions on file for this visit.   Signed, Sinclair Grooms, MD  06/01/2016 5:42 PM    [facility] [address], [place], [place]  [address] Phone: [phone]; Fax: [phone]

## 2016-06-02 ENCOUNTER — Ambulatory Visit: Payer: Medicare Other | Admitting: Interventional Cardiology

## 2016-06-02 ENCOUNTER — Telehealth: Payer: Self-pay | Admitting: Interventional Cardiology

## 2016-06-02 NOTE — Telephone Encounter (Signed)
Pt had to cancel appt for family emergency.  Wanted sooner appt.  Advised I could get her in with a PA or NP sooner.  She said she would keep appt with Dr. Tamala Julian on 07/14/16 and call for sooner appt if necessary.  Pt appreciative for assistance.

## 2016-06-02 NOTE — Telephone Encounter (Signed)
New Message     Please call she had to cancel her appt 06/02/16, but she still needs to have medical clearance for dental appt , needs to speak to you asap

## 2016-06-20 ENCOUNTER — Ambulatory Visit (HOSPITAL_COMMUNITY): Admission: RE | Admit: 2016-06-20 | Payer: Medicare Other | Source: Ambulatory Visit | Admitting: Gastroenterology

## 2016-06-20 ENCOUNTER — Encounter (HOSPITAL_COMMUNITY): Admission: RE | Payer: Self-pay | Source: Ambulatory Visit

## 2016-06-20 SURGERY — COLONOSCOPY WITH PROPOFOL
Anesthesia: Monitor Anesthesia Care

## 2016-06-28 ENCOUNTER — Ambulatory Visit: Payer: Medicare Other

## 2016-07-05 ENCOUNTER — Encounter: Payer: Self-pay | Admitting: Interventional Cardiology

## 2016-07-13 NOTE — Progress Notes (Signed)
Cardiology Office Note    Date:  07/14/2016   ID:  Allison, Nichols August 09, 1945, MRN [id number]  PCP:  Mathews Argyle, MD  Cardiologist: Sinclair Grooms, MD   Chief Complaint  Patient presents with  . Atrial Fibrillation    History of Present Illness:  [name] is a 72 y.o. female with a history of PAF on amio and Eliquis, DM, MVP, HLD, fibromyalgia, GERD and OSA.  In the interval since I have seen her, she was evaluated for chest pain and had a low risk myocardial perfusion study. No overt episodes of atrial fibrillation have occurred. No apparent side effects to the current medical regimen. No bleeding complications. No neurological complaints. Chest discomfort is described as pressure and is more likely to occur when she is under stress.   Past Medical History:  Diagnosis Date  . Complication of anesthesia 1975   projectile vomitting  . Diabetes mellitus   . Dyslipidemia   . Dysrhythmia    hx. A.Fib-Cardioversion-converted rhythm  . Fibromyalgia   . GERD (gastroesophageal reflux disease)   . Hypertension   . MVP (mitral valve prolapse)   . OSA (obstructive sleep apnea) 10-18-12   no cpap used now.  [name] PONV (postoperative nausea and vomiting)   . Vitamin D deficiency 10-18-12    Past Surgical History:  Procedure Laterality Date  . ABDOMINAL HYSTERECTOMY     '95  . BALLOON DILATION N/A 12/03/2012   Procedure: BALLOON DILATION;  Surgeon: [name], MD;  Location: [facility];  Service: Endoscopy;  Laterality: N/A;  . CARDIOVERSION  07/28/2011   Procedure: CARDIOVERSION;  Surgeon: Sinclair Grooms, MD;  Location: [facility];  Service: Cardiovascular;  Laterality: N/A;  . CATARACT EXTRACTION Left 10-18-12   10'13  . COLONOSCOPY WITH PROPOFOL N/A 03/29/2015   Procedure: COLONOSCOPY WITH PROPOFOL;  Surgeon: [name], MD;  Location: [facility];  Service: Endoscopy;  Laterality: N/A;  . ESOPHAGEAL MANOMETRY N/A 05/19/2013   Procedure: ESOPHAGEAL  MANOMETRY (EM);  Surgeon: [name], MD;  Location: [facility];  Service: Endoscopy;  Laterality: N/A;  . ESOPHAGOGASTRODUODENOSCOPY (EGD) WITH PROPOFOL N/A 12/03/2012   Procedure: ESOPHAGOGASTRODUODENOSCOPY (EGD) WITH Dilation & w/PROPOFOL;  Surgeon: [name], MD;  Location: [facility];  Service: Endoscopy;  Laterality: N/A;  . THYROIDECTOMY  1981  . TUBAL LIGATION      Current Medications: Outpatient Medications Prior to Visit  Medication Sig Dispense Refill  . acetaminophen (TYLENOL) 500 MG tablet Take 500 mg by mouth every 6 (six) hours as needed for mild pain or fever.     [name] amiodarone (PACERONE) 200 MG tablet Take 0.5 tablets (100 mg total) by mouth daily. 45 tablet 3  . amphetamine-dextroamphetamine (ADDERALL) 20 MG tablet Take 20 mg by mouth 3 (three) times daily.     [name] atorvastatin (LIPITOR) 80 MG tablet Take 80 mg by mouth at bedtime.     . BD PEN NEEDLE NANO U/F 32G X 4 MM MISC     . Cholecalciferol (VITAMIN D) 2000 UNITS tablet Take 2,000 Units by mouth daily.    . Dulaglutide (TRULICITY) 1.5 NI/6.2VO SOPN Inject 1.5 mg into the skin every 7 (seven) days. Thursdays    . DULoxetine (CYMBALTA) 60 MG capsule Take 60 mg by mouth 2 (two) times daily.    [name] ELIQUIS 5 MG TABS tablet Take 5 mg by mouth 2 (two) times daily.     [name] esomeprazole (NEXIUM) 40 MG capsule  Take 40 mg by mouth 2 (two) times daily.     . furosemide (LASIX) 20 MG tablet Take 0.5 tablets (10 mg total) by mouth daily as needed for fluid. 45 tablet 1  . LORazepam (ATIVAN) 1 MG tablet Take 1 mg by mouth 2 (two) times daily as needed for anxiety.     . metoprolol tartrate (LOPRESSOR) 25 MG tablet Take 1 tablet (25 mg total) by mouth 2 (two) times daily. 60 tablet 3  . nitroGLYCERIN (NITROSTAT) 0.4 MG SL tablet Place 0.4 mg under the tongue every 5 (five) minutes as needed. For chest pain from esophageal spasms    . oxyCODONE (OXY IR/ROXICODONE) 5 MG immediate release tablet Take 5 mg by mouth every 6  (six) hours as needed (for breakthrough pain).    [name] oxyCODONE (OXYCONTIN) 40 MG 12 hr tablet Take 40 mg by mouth every 8 (eight) hours.     . polyethylene glycol (MIRALAX / GLYCOLAX) packet Take 17-34 g by mouth daily. Higher dose if she feels more constipated.    . pregabalin (LYRICA) 150 MG capsule Take 150-450 mg by mouth. 1 in the am and 3 in the pm    . insulin glargine (LANTUS) 100 UNIT/ML injection Inject 115 Units into the skin every morning.      No facility-administered medications prior to visit.      Allergies:   Iohexol; Morphine and related; Penicillins; Demerol; Victoza [liraglutide]; Dabigatran etexilate mesylate; and Latex   Social History   Social History  . Marital status: Married    Spouse name: N/A  . Number of children: N/A  . Years of education: N/A   Social History Main Topics  . Smoking status: Never Smoker  . Smokeless tobacco: Never Used  . Alcohol use No  . Drug use: No  . Sexual activity: Yes   Other Topics Concern  . None   Social History Narrative  . None     Family History:  The patient's family history includes Atrial fibrillation in her father; Bronchitis in her father; Diabetes type II in her father; Heart disease in her father; Hypertension in her mother; Hyperthyroidism in her mother; Kidney cancer in her sister.   ROS:   Please see the history of present illness.    Dyspnea on exertion, depression, anxiety, dizziness, muscle pain, snoring, constipation, difficulty urinating, balance difficulty, and excessive fatigue and swelling.  All other systems reviewed and are negative.   PHYSICAL EXAM:   VS:  BP 100/62 (BP Location: Left Arm)   Pulse 65   Ht 5' 4.5" (1.638 m)   Wt 215 lb 6.4 oz (97.7 kg)   BMI 36.40 kg/m    GEN: Well nourished, well developed, in no acute distress  HEENT: normal  Neck: no JVD, carotid bruits, or masses Cardiac: RRR; no murmurs, rubs, or gallops,no edema  Respiratory:  clear to auscultation bilaterally,  normal work of breathing GI: soft, nontender, nondistended, + BS MS: no deformity or atrophy  Skin: warm and dry, no rash Neuro:  Alert and Oriented x 3, Strength and sensation are intact Psych: euthymic mood, full affect  Wt Readings from Last 3 Encounters:  07/14/16 215 lb 6.4 oz (97.7 kg)  02/10/16 214 lb (97.1 kg)  01/18/16 214 lb 1.9 oz (97.1 kg)      Studies/Labs Reviewed:   EKG:  EKG  Not repeated  Recent Labs: 01/18/2016: ALT 20; TSH 2.62   Lipid Panel No results found for: CHOL, TRIG, HDL, CHOLHDL, VLDL,  [place], LDLDIRECT  Additional studies/ records that were reviewed today include:  Nuclear stress testing 02/16/2016:  Nuclear stress EF: 64%.  There was no ST segment deviation noted during stress.  There is a small defect of mild severity present in the apex location. The defect is non-reversible. This is most consistent with diaphragmatic attenuation artifact. No ischemia noted.  This is a low risk study.  The left ventricular ejection fraction is normal (55-65%).   ASSESSMENT:    1. Paroxysmal atrial fibrillation (HCC)   2. Essential hypertension   3. Mitral valve disorder   4. Obstructive sleep apnea   5. On amiodarone therapy   6. Anticoagulation goal of INR 2 to 3   7. Achalasia      PLAN:  In order of problems listed above:  1. Rhythm control has been excellent with low dose amiodarone, 100 mg daily. TSH and hepatic panel today. In 6 months TSH and hepatic panel along with chest x-ray. 2. 2 g sodium diet is recommended. Blood pressures in good range. 3. Not a significant clinical issue 4. Encouraged continued C Pap use 5. On 100 mg daily. We will continue to monitor for evidence of toxicity. TSH, hepatic panel, and chest x-ray on the next visit. 6. No bleeding on current anticoagulation regimen.  Continue same therapy. Clinical follow-up in 6 months.    Medication Adjustments/Labs and Tests Ordered: Current medicines are reviewed at  length with the patient today.  Concerns regarding medicines are outlined above.  Medication changes, Labs and Tests ordered today are listed in the Patient Instructions below. There are no Patient Instructions on file for this visit.   Signed, Sinclair Grooms, MD  07/14/2016 2:49 PM    [address], [place], [place]  [address] Phone: [phone]; Fax: [phone]

## 2016-07-14 ENCOUNTER — Ambulatory Visit (INDEPENDENT_AMBULATORY_CARE_PROVIDER_SITE_OTHER): Payer: Medicare Other | Admitting: Interventional Cardiology

## 2016-07-14 ENCOUNTER — Encounter: Payer: Self-pay | Admitting: Interventional Cardiology

## 2016-07-14 ENCOUNTER — Encounter (INDEPENDENT_AMBULATORY_CARE_PROVIDER_SITE_OTHER): Payer: Self-pay

## 2016-07-14 VITALS — BP 100/62 | HR 65 | Ht 64.5 in | Wt 215.4 lb

## 2016-07-14 DIAGNOSIS — G4733 Obstructive sleep apnea (adult) (pediatric): Secondary | ICD-10-CM

## 2016-07-14 DIAGNOSIS — I059 Rheumatic mitral valve disease, unspecified: Secondary | ICD-10-CM

## 2016-07-14 DIAGNOSIS — Z5181 Encounter for therapeutic drug level monitoring: Secondary | ICD-10-CM | POA: Diagnosis not present

## 2016-07-14 DIAGNOSIS — K220 Achalasia of cardia: Secondary | ICD-10-CM | POA: Diagnosis not present

## 2016-07-14 DIAGNOSIS — I480 Paroxysmal atrial fibrillation: Secondary | ICD-10-CM

## 2016-07-14 DIAGNOSIS — I10 Essential (primary) hypertension: Secondary | ICD-10-CM | POA: Diagnosis not present

## 2016-07-14 DIAGNOSIS — Z79899 Other long term (current) drug therapy: Secondary | ICD-10-CM | POA: Diagnosis not present

## 2016-07-14 DIAGNOSIS — Z7901 Long term (current) use of anticoagulants: Secondary | ICD-10-CM

## 2016-07-14 NOTE — Patient Instructions (Addendum)
Medication Instructions:  Your physician recommends that you continue on your current medications as directed. Please refer to the Current Medication list given to you today.  Labwork: Your physician recommends that you get lab work today:TSH and Hepatic Panel  Your physician recommends that you return for lab work in: 6 months repeat TSH and Hepatic Panel  Testing/Procedures: A chest x-ray takes a picture of the organs and structures inside the chest, including the heart, lungs, and blood vessels. This test can show several things, including, whether the heart is enlarges; whether fluid is building up in the lungs; and whether pacemaker / defibrillator leads are still in place. This needs to be done in 6 months just prior to your follow up appointment with Dr. Tamala Julian.   Follow-Up: Your physician wants you to follow-up in 6 months with Dr. Tamala Julian with repeating labs. You will receive a reminder letter in the mail two months in advance. If you don't receive a letter, please call our office to schedule the follow-up appointment.  Any Other Special Instructions Will Be Listed Below (If Applicable).     If you need a refill on your cardiac medications before your next appointment, please call your pharmacy.

## 2016-07-15 LAB — HEPATIC FUNCTION PANEL
ALT: 19 IU/L (ref 0–32)
AST: 17 IU/L (ref 0–40)
Albumin: 4.1 g/dL (ref 3.6–4.8)
Alkaline Phosphatase: 102 IU/L (ref 39–117)
Bilirubin Total: 0.3 mg/dL (ref 0.0–1.2)
Bilirubin, Direct: 0.11 mg/dL (ref 0.00–0.40)
Total Protein: 6.7 g/dL (ref 6.0–8.5)

## 2016-07-15 LAB — TSH: TSH: 3.56 u[IU]/mL (ref 0.450–4.500)

## 2016-07-17 ENCOUNTER — Telehealth: Payer: Self-pay | Admitting: Internal Medicine

## 2016-07-17 ENCOUNTER — Telehealth: Payer: Self-pay | Admitting: Interventional Cardiology

## 2016-07-17 DIAGNOSIS — G4733 Obstructive sleep apnea (adult) (pediatric): Secondary | ICD-10-CM

## 2016-07-17 NOTE — Telephone Encounter (Signed)
[id number] x1 

## 2016-07-17 NOTE — Telephone Encounter (Signed)
Sent clearance to Dr. Valene Bors office.  Left message for pt to call back.

## 2016-07-17 NOTE — Telephone Encounter (Signed)
Spoke with pt and she forgot to ask about clearance and holding Eliquis for dental procedure.  This is the message that was previously given from dental office.  This must be on our letter head. Request for surgical clearance:  1. What type of surgery is being performed? Extraction of four teeth and bone grafting   2. When is this surgery scheduled? Have not been scheduled   3. Are there any medications that need to be held prior to surgery Can  the pt stop her Eliqius is  before surgery?If so,how many days before?  4. Name of physician performing surgery? Dr San Jetty   5. What is your office phone and fax number? [phone] and fax is [phone]

## 2016-07-17 NOTE — Telephone Encounter (Signed)
Hold Eliqui for 72 hours prior to the procedure and do not resume for at least 24 hours after procedure (also base resarrt on the instruction from the dentist).

## 2016-07-18 NOTE — Telephone Encounter (Signed)
Left detailed message on pt's answering machine. Ok per [employer].  Advised to call back if any questions.

## 2016-07-19 NOTE — Telephone Encounter (Signed)
Called and spoke with pt and she stated that she would like to schedule her sleep study again.  Looks like there is an order in from January for a split night sleep study.  CY please advise. Thanks  Last ov--05/06/2015 Next ov--none scheduled.

## 2016-07-19 NOTE — Telephone Encounter (Signed)
Pt aware of order being place. Aware that when she gets scheduled for her sleep study to contact our office for a follow up visit and to schedule this 2 weeks after her study is completed. Nothing further needed.

## 2016-07-19 NOTE — Telephone Encounter (Signed)
Ok to re-order split night NPSG for dx OSA    When that date is know, please schedule ROV at least 2 weeks after the study

## 2016-08-07 DIAGNOSIS — E1142 Type 2 diabetes mellitus with diabetic polyneuropathy: Secondary | ICD-10-CM | POA: Diagnosis not present

## 2016-08-07 DIAGNOSIS — N183 Chronic kidney disease, stage 3 (moderate): Secondary | ICD-10-CM | POA: Diagnosis not present

## 2016-08-07 DIAGNOSIS — E1165 Type 2 diabetes mellitus with hyperglycemia: Secondary | ICD-10-CM | POA: Diagnosis not present

## 2016-08-07 DIAGNOSIS — Z794 Long term (current) use of insulin: Secondary | ICD-10-CM | POA: Diagnosis not present

## 2016-08-07 DIAGNOSIS — M21619 Bunion of unspecified foot: Secondary | ICD-10-CM | POA: Diagnosis not present

## 2016-08-15 DIAGNOSIS — E1142 Type 2 diabetes mellitus with diabetic polyneuropathy: Secondary | ICD-10-CM | POA: Diagnosis not present

## 2016-08-15 DIAGNOSIS — Z794 Long term (current) use of insulin: Secondary | ICD-10-CM | POA: Diagnosis not present

## 2016-08-18 DIAGNOSIS — M7711 Lateral epicondylitis, right elbow: Secondary | ICD-10-CM | POA: Diagnosis not present

## 2016-08-18 DIAGNOSIS — M1812 Unilateral primary osteoarthritis of first carpometacarpal joint, left hand: Secondary | ICD-10-CM | POA: Diagnosis not present

## 2016-08-18 DIAGNOSIS — D1721 Benign lipomatous neoplasm of skin and subcutaneous tissue of right arm: Secondary | ICD-10-CM | POA: Diagnosis not present

## 2016-08-18 DIAGNOSIS — G5603 Carpal tunnel syndrome, bilateral upper limbs: Secondary | ICD-10-CM | POA: Diagnosis not present

## 2016-08-21 DIAGNOSIS — Z794 Long term (current) use of insulin: Secondary | ICD-10-CM | POA: Diagnosis not present

## 2016-08-21 DIAGNOSIS — E1142 Type 2 diabetes mellitus with diabetic polyneuropathy: Secondary | ICD-10-CM | POA: Diagnosis not present

## 2016-08-31 ENCOUNTER — Ambulatory Visit: Payer: Medicare Other

## 2016-09-06 DIAGNOSIS — H2511 Age-related nuclear cataract, right eye: Secondary | ICD-10-CM | POA: Diagnosis not present

## 2016-09-06 DIAGNOSIS — H26492 Other secondary cataract, left eye: Secondary | ICD-10-CM | POA: Diagnosis not present

## 2016-09-08 ENCOUNTER — Ambulatory Visit (HOSPITAL_BASED_OUTPATIENT_CLINIC_OR_DEPARTMENT_OTHER): Payer: Medicare Other | Attending: Internal Medicine | Admitting: Internal Medicine

## 2016-09-08 VITALS — Ht 65.0 in | Wt 210.0 lb

## 2016-09-08 DIAGNOSIS — L723 Sebaceous cyst: Secondary | ICD-10-CM | POA: Diagnosis not present

## 2016-09-08 DIAGNOSIS — G5603 Carpal tunnel syndrome, bilateral upper limbs: Secondary | ICD-10-CM | POA: Diagnosis not present

## 2016-09-08 DIAGNOSIS — M1812 Unilateral primary osteoarthritis of first carpometacarpal joint, left hand: Secondary | ICD-10-CM | POA: Diagnosis not present

## 2016-09-08 DIAGNOSIS — M7711 Lateral epicondylitis, right elbow: Secondary | ICD-10-CM | POA: Diagnosis not present

## 2016-09-08 DIAGNOSIS — G4733 Obstructive sleep apnea (adult) (pediatric): Secondary | ICD-10-CM | POA: Diagnosis not present

## 2016-09-08 DIAGNOSIS — R0902 Hypoxemia: Secondary | ICD-10-CM | POA: Diagnosis not present

## 2016-09-11 DIAGNOSIS — H2511 Age-related nuclear cataract, right eye: Secondary | ICD-10-CM | POA: Diagnosis not present

## 2016-09-11 DIAGNOSIS — H5319 Other subjective visual disturbances: Secondary | ICD-10-CM | POA: Diagnosis not present

## 2016-09-17 DIAGNOSIS — G4733 Obstructive sleep apnea (adult) (pediatric): Secondary | ICD-10-CM

## 2016-09-17 NOTE — Procedures (Signed)
Patient Name: [name], [name] Study Date: 09/08/2016 Gender: Female D.O.B: Apr 02, 1946 Age (years): 6 Referring Provider: Baird Lyons MD, ABSM Height (inches): 65 Interpreting Physician: Baird Lyons MD, ABSM Weight (lbs): 210 RPSGT: Baxter Flattery BMI: 35 MRN: [id number] Neck Size: 15.00 CLINICAL INFORMATION Sleep Study Type: NPSG  Indication for sleep study: Diabetes, Fatigue, Obesity, OSA, Snoring, Witnessed Apneas  Epworth Sleepiness Score: 9  SLEEP STUDY TECHNIQUE As per the AASM Manual for the Scoring of Sleep and Associated Events v2.3 (April 2016) with a hypopnea requiring 4% desaturations.  The channels recorded and monitored were frontal, central and occipital EEG, electrooculogram (EOG), submentalis EMG (chin), nasal and oral airflow, thoracic and abdominal wall motion, anterior tibialis EMG, snore microphone, electrocardiogram, and pulse oximetry.  MEDICATIONS Medications self-administered by patient taken the night of the study : CYMBALTA, METOPROLOL, NEXIUM, OXYCONTIN, ATIVAN, LIPITOR  SLEEP ARCHITECTURE The study was initiated at 10:02:07 PM and ended at 4:46:54 AM.  Sleep onset time was 18.0 minutes and the sleep efficiency was 78.3%. The total sleep time was 317.0 minutes.  Stage REM latency was N/A minutes.  The patient spent 6.31% of the night in stage N1 sleep, 93.69% in stage N2 sleep, 0.00% in stage N3 and 0.00% in REM.  Alpha intrusion was absent.  Supine sleep was 0.00%.  RESPIRATORY PARAMETERS The overall apnea/hypopnea index (AHI) was 14.0 per hour. There were 29 total apneas, including 7 obstructive, 20 central and 2 mixed apneas. There were 45 hypopneas and 3 RERAs.  The AHI during Stage REM sleep was N/A per hour.  AHI while supine was N/A per hour.  The mean oxygen saturation was 88.21%. The minimum SpO2 during sleep was 80.00%.  Moderate snoring was noted during this study.  CARDIAC DATA The 2 lead EKG demonstrated sinus rhythm.  The mean heart rate was 63.01 beats per minute. Other EKG findings include: None.  LEG MOVEMENT DATA The total PLMS were 1 with a resulting PLMS index of 0.19. Associated arousal with leg movement index was 0.0 .  IMPRESSIONS - Mild obstructive sleep apnea occurred during this study (AHI = 14.0/h). - There were insufficient early events to meet protocol requirements for split CPAP titration. - No significant central sleep apnea occurred during this study (CAI = 3.8/h). - Moderate oxygen desaturation was noted during this study (Min O2 = 80.00%, Mean 88.2%). - The patient snored with Moderate snoring volume. - No cardiac abnormalities were noted during this study. - Clinically significant periodic limb movements did not occur during sleep. No significant associated arousals.  DIAGNOSIS - Obstructive Sleep Apnea (327.23 [G47.33 ICD-10]) - Nocturnal Hypoxemia (327.26 [G47.36 ICD-10]  RECOMMENDATIONS - Therapeutic CPAP titration to determine optimal pressure required to alleviate sleep disordered breathing. Other options based on clinical judgment. - Low mean oxygen saturation suggests underlying cardiopulmonary disease or hypoventilation. Suggest attention to oxygenation status during sleep with treatment for OSA in place. - Be careful with alcohol, sedatives and other CNS depressants that may worsen sleep apnea and disrupt normal sleep architecture. - Sleep hygiene should be reviewed to assess factors that may improve sleep quality. - Weight management and regular exercise should be initiated or continued if appropriate.  [Electronically signed] 09/17/2016 11:20 AM  Baird Lyons MD, ABSM Diplomate, American Board of Sleep Medicine   NPI: [id number]  Ali Chuk, American Board of Sleep Medicine  ELECTRONICALLY SIGNED ON:  09/17/2016, 11:14 AM [place] PH: [phone]   FX: [phone]) [place]  MEDICINE

## 2016-09-25 DIAGNOSIS — Z9989 Dependence on other enabling machines and devices: Secondary | ICD-10-CM | POA: Diagnosis not present

## 2016-09-25 DIAGNOSIS — M797 Fibromyalgia: Secondary | ICD-10-CM | POA: Diagnosis not present

## 2016-09-25 DIAGNOSIS — G4733 Obstructive sleep apnea (adult) (pediatric): Secondary | ICD-10-CM | POA: Diagnosis not present

## 2016-09-25 DIAGNOSIS — N183 Chronic kidney disease, stage 3 (moderate): Secondary | ICD-10-CM | POA: Diagnosis not present

## 2016-09-25 DIAGNOSIS — R002 Palpitations: Secondary | ICD-10-CM | POA: Diagnosis not present

## 2016-09-25 DIAGNOSIS — Z7901 Long term (current) use of anticoagulants: Secondary | ICD-10-CM | POA: Diagnosis not present

## 2016-09-25 DIAGNOSIS — E1122 Type 2 diabetes mellitus with diabetic chronic kidney disease: Secondary | ICD-10-CM | POA: Diagnosis not present

## 2016-09-25 DIAGNOSIS — R079 Chest pain, unspecified: Secondary | ICD-10-CM | POA: Diagnosis not present

## 2016-09-25 DIAGNOSIS — R112 Nausea with vomiting, unspecified: Secondary | ICD-10-CM | POA: Diagnosis not present

## 2016-09-25 DIAGNOSIS — Z87891 Personal history of nicotine dependence: Secondary | ICD-10-CM | POA: Diagnosis not present

## 2016-09-25 DIAGNOSIS — Z794 Long term (current) use of insulin: Secondary | ICD-10-CM | POA: Diagnosis not present

## 2016-09-25 DIAGNOSIS — E11649 Type 2 diabetes mellitus with hypoglycemia without coma: Secondary | ICD-10-CM | POA: Diagnosis not present

## 2016-09-25 DIAGNOSIS — R0789 Other chest pain: Secondary | ICD-10-CM | POA: Diagnosis not present

## 2016-09-25 DIAGNOSIS — I129 Hypertensive chronic kidney disease with stage 1 through stage 4 chronic kidney disease, or unspecified chronic kidney disease: Secondary | ICD-10-CM | POA: Diagnosis not present

## 2016-09-25 DIAGNOSIS — Z79899 Other long term (current) drug therapy: Secondary | ICD-10-CM | POA: Diagnosis not present

## 2016-09-25 DIAGNOSIS — E785 Hyperlipidemia, unspecified: Secondary | ICD-10-CM | POA: Diagnosis not present

## 2016-09-25 DIAGNOSIS — R9431 Abnormal electrocardiogram [ECG] [EKG]: Secondary | ICD-10-CM | POA: Diagnosis not present

## 2016-09-25 DIAGNOSIS — Z880 Allergy status to penicillin: Secondary | ICD-10-CM | POA: Diagnosis not present

## 2016-10-04 ENCOUNTER — Telehealth: Payer: Self-pay | Admitting: Internal Medicine

## 2016-10-04 DIAGNOSIS — Z7901 Long term (current) use of anticoagulants: Secondary | ICD-10-CM | POA: Diagnosis not present

## 2016-10-04 DIAGNOSIS — L989 Disorder of the skin and subcutaneous tissue, unspecified: Secondary | ICD-10-CM | POA: Diagnosis not present

## 2016-10-04 DIAGNOSIS — K220 Achalasia of cardia: Secondary | ICD-10-CM | POA: Diagnosis not present

## 2016-10-04 DIAGNOSIS — G4733 Obstructive sleep apnea (adult) (pediatric): Secondary | ICD-10-CM

## 2016-10-04 DIAGNOSIS — D1720 Benign lipomatous neoplasm of skin and subcutaneous tissue of unspecified limb: Secondary | ICD-10-CM | POA: Diagnosis not present

## 2016-10-04 NOTE — Telephone Encounter (Signed)
I had last seen her and ordered this sleep study 05/06/15 (?!)  It showed mild to moderate obstructive sleep apnea, stopping 14 times per hour, with low oxygen saturation. I know she wasn't really comfortable with CPAP several years ago, but that would still be the best therapy if she is willing to try again with a newer machine and mask design. Recommend order new DME, new CPAP auto 5-20, mask of choice, humidifier, supplies, AirView   Dx OSA.  She then needs an appointment to see me in 3 months or less to meet insurance requirements for follow-up documentation.   If she isn't willing to try CPAP again, and has her own teeth, then we can refer her to Dr Oneal Grout, orthodontist   Requesting evaluation for an oral appliance to treat OSA.

## 2016-10-04 NOTE — Telephone Encounter (Signed)
Spoke with patient. She stated that she had a split night sleep study performed last month at the [facility] on 09/08/16. Study is listed as completed with a narrative by CY.    CY, please advise on her results. Thanks.

## 2016-10-04 NOTE — Telephone Encounter (Signed)
Spoke with patient regarding results. Patient wishes to use the oral appliance. Refer to Dr. Ron Parker has been made. She is aware.

## 2016-10-13 ENCOUNTER — Encounter: Payer: Self-pay | Admitting: Gastroenterology

## 2016-10-16 ENCOUNTER — Other Ambulatory Visit: Payer: Self-pay | Admitting: *Deleted

## 2016-10-16 MED ORDER — METOPROLOL TARTRATE 25 MG PO TABS: 25.0000 mg | ORAL_TABLET | Freq: Two times a day (BID) | ORAL | 8 refills | Status: DC

## 2016-10-20 ENCOUNTER — Telehealth: Payer: Self-pay | Admitting: Pharmacist

## 2016-10-20 NOTE — Telephone Encounter (Signed)
Received fax from [facility] for anticoag clearance for wrist surgery. Pt on Eliquis for Afib with CHADS<4 and no history of stroke. Per protocol ok to hold 24 hours prior to procedure. Faxed back to number provided.

## 2016-11-03 DIAGNOSIS — N183 Chronic kidney disease, stage 3 (moderate): Secondary | ICD-10-CM | POA: Diagnosis not present

## 2016-11-03 DIAGNOSIS — Z794 Long term (current) use of insulin: Secondary | ICD-10-CM | POA: Diagnosis not present

## 2016-11-03 DIAGNOSIS — E1142 Type 2 diabetes mellitus with diabetic polyneuropathy: Secondary | ICD-10-CM | POA: Diagnosis not present

## 2016-11-03 DIAGNOSIS — R112 Nausea with vomiting, unspecified: Secondary | ICD-10-CM | POA: Diagnosis not present

## 2016-11-22 DIAGNOSIS — H3589 Other specified retinal disorders: Secondary | ICD-10-CM | POA: Diagnosis not present

## 2016-11-22 DIAGNOSIS — H26492 Other secondary cataract, left eye: Secondary | ICD-10-CM | POA: Diagnosis not present

## 2016-11-22 DIAGNOSIS — H539 Unspecified visual disturbance: Secondary | ICD-10-CM | POA: Diagnosis not present

## 2016-11-22 DIAGNOSIS — Z961 Presence of intraocular lens: Secondary | ICD-10-CM | POA: Diagnosis not present

## 2016-11-22 DIAGNOSIS — H2511 Age-related nuclear cataract, right eye: Secondary | ICD-10-CM | POA: Diagnosis not present

## 2016-11-22 DIAGNOSIS — H43393 Other vitreous opacities, bilateral: Secondary | ICD-10-CM | POA: Diagnosis not present

## 2016-11-22 HISTORY — PX: RETINAL DETACHMENT SURGERY: SHX105

## 2016-12-04 DIAGNOSIS — G4733 Obstructive sleep apnea (adult) (pediatric): Secondary | ICD-10-CM | POA: Diagnosis not present

## 2016-12-04 DIAGNOSIS — Z961 Presence of intraocular lens: Secondary | ICD-10-CM | POA: Diagnosis not present

## 2016-12-04 DIAGNOSIS — I129 Hypertensive chronic kidney disease with stage 1 through stage 4 chronic kidney disease, or unspecified chronic kidney disease: Secondary | ICD-10-CM | POA: Diagnosis not present

## 2016-12-04 DIAGNOSIS — I4891 Unspecified atrial fibrillation: Secondary | ICD-10-CM | POA: Diagnosis not present

## 2016-12-04 DIAGNOSIS — E785 Hyperlipidemia, unspecified: Secondary | ICD-10-CM | POA: Diagnosis not present

## 2016-12-04 DIAGNOSIS — H43392 Other vitreous opacities, left eye: Secondary | ICD-10-CM | POA: Diagnosis not present

## 2016-12-04 DIAGNOSIS — E1122 Type 2 diabetes mellitus with diabetic chronic kidney disease: Secondary | ICD-10-CM | POA: Diagnosis not present

## 2016-12-04 DIAGNOSIS — N183 Chronic kidney disease, stage 3 (moderate): Secondary | ICD-10-CM | POA: Diagnosis not present

## 2016-12-05 ENCOUNTER — Telehealth: Payer: Self-pay | Admitting: *Deleted

## 2016-12-05 ENCOUNTER — Ambulatory Visit (INDEPENDENT_AMBULATORY_CARE_PROVIDER_SITE_OTHER): Payer: Medicare Other | Admitting: Gastroenterology

## 2016-12-05 ENCOUNTER — Encounter: Payer: Self-pay | Admitting: Gastroenterology

## 2016-12-05 ENCOUNTER — Encounter (INDEPENDENT_AMBULATORY_CARE_PROVIDER_SITE_OTHER): Payer: Self-pay

## 2016-12-05 VITALS — BP 118/62 | HR 74 | Ht 65.0 in | Wt 227.5 lb

## 2016-12-05 DIAGNOSIS — K5909 Other constipation: Secondary | ICD-10-CM

## 2016-12-05 DIAGNOSIS — Z86010 Personal history of colonic polyps: Secondary | ICD-10-CM | POA: Diagnosis not present

## 2016-12-05 DIAGNOSIS — Z7901 Long term (current) use of anticoagulants: Secondary | ICD-10-CM

## 2016-12-05 DIAGNOSIS — Z800 Family history of malignant neoplasm of digestive organs: Secondary | ICD-10-CM

## 2016-12-05 DIAGNOSIS — K219 Gastro-esophageal reflux disease without esophagitis: Secondary | ICD-10-CM | POA: Diagnosis not present

## 2016-12-05 DIAGNOSIS — R1310 Dysphagia, unspecified: Secondary | ICD-10-CM | POA: Diagnosis not present

## 2016-12-05 DIAGNOSIS — R1319 Other dysphagia: Secondary | ICD-10-CM

## 2016-12-05 MED ORDER — NALOXEGOL OXALATE 25 MG PO TABS: 25.0000 mg | ORAL_TABLET | Freq: Every day | ORAL | 3 refills | Status: DC

## 2016-12-05 MED ORDER — RANITIDINE HCL 150 MG PO TABS: 150.0000 mg | ORAL_TABLET | Freq: Every day | ORAL | 3 refills | Status: DC

## 2016-12-05 MED ORDER — NA SULFATE-K SULFATE-MG SULF 17.5-3.13-1.6 GM/177ML PO SOLN: 1.0000 | Freq: Once | ORAL | 0 refills | Status: DC

## 2016-12-05 MED ORDER — ESOMEPRAZOLE MAGNESIUM 40 MG PO CPDR
40.0000 mg | DELAYED_RELEASE_CAPSULE | Freq: Two times a day (BID) | ORAL | 3 refills | Status: DC
Start: 2016-12-05 — End: 2017-12-05

## 2016-12-05 MED ORDER — NA SULFATE-K SULFATE-MG SULF 17.5-3.13-1.6 GM/177ML PO SOLN: ORAL | 0 refills | Status: DC

## 2016-12-05 NOTE — Progress Notes (Signed)
[name]    [id number]    1945/10/30  Primary Care Physician:Stoneking, Christiane Ha, MD  Referring Physician: Lajean Manes, MD [address] [place],  [address]  Chief complaint:  Dysphagia, achalasia, GERD, constipation, colon cancer screening  HPI: 71 year old female previously followed by Dr. Wynetta Emery is here for evaluation and to establish care. She has history of chronic GERD and dysphagia. She had EGD, barium swallow and esophageal manometry.  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 She has dysphagia mostly to solids especially bread and meat. She also has heartburn and regurgitation, times wakes up at night with choking sensation. Last colonoscopy in 2016 was incomplete as Dr. Wynetta Emery could not advance the scope beyond splenic flexure. She has intermittent constipation with straining and has noted small volume bright red blood per rectum. Denies any nausea, vomiting, abdominal pain or melena. She has family history of colon cancer. She is on chronic anticoagulation with Eliquis for A. fib. She is on chronic narcotics rotator cuff injury   Outpatient Encounter Prescriptions as of 12/05/2016  Medication Sig  . acetaminophen (TYLENOL) 500 MG tablet Take 500 mg by mouth every 6 (six) hours as needed for mild pain or fever.   [name] amiodarone (PACERONE) 200 MG tablet Take 0.5 tablets (100 mg total) by mouth daily.  [name] amphetamine-dextroamphetamine (ADDERALL) 20 MG tablet Take 20 mg by mouth 3 (three) times daily.   [name] atorvastatin (LIPITOR) 80 MG tablet Take 80 mg by mouth at bedtime.   . BD PEN NEEDLE NANO U/F 32G X 4 MM MISC   . Cholecalciferol (VITAMIN D) 2000 UNITS tablet Take 2,000 Units by mouth daily.  . DULoxetine (CYMBALTA) 60 MG capsule Take 60 mg by mouth 2 (two) times daily.  [name] ELIQUIS 5 MG TABS  tablet Take 5 mg by mouth 2 (two) times daily.   . furosemide (LASIX) 20 MG tablet Take 0.5 tablets (10 mg total) by mouth daily as needed for fluid.  . Insulin Glargine (BASAGLAR KWIKPEN) 100 UNIT/ML SOPN Inject 110 Units into the skin daily.  . insulin lispro (HUMALOG) 100 UNIT/ML injection Inject into the skin 2 (two) times daily. 15 untis BID  . LORazepam (ATIVAN) 1 MG tablet Take 1 mg by mouth 2 (two) times daily as needed for anxiety.   . metoprolol tartrate (LOPRESSOR) 25 MG tablet Take 1 tablet (25 mg total) by mouth 2 (two) times daily.  . nitroGLYCERIN (NITROSTAT) 0.4 MG SL tablet Place 0.4 mg under the tongue every 5 (five) minutes as needed. For chest pain from esophageal spasms  . oxyCODONE (OXY IR/ROXICODONE) 5 MG immediate release tablet Take 5 mg by mouth every 6 (six) hours as needed (for breakthrough pain).  [name] oxyCODONE (OXYCONTIN) 40 MG 12 hr tablet Take 40 mg by mouth every 8 (eight) hours.   . polyethylene glycol (MIRALAX / GLYCOLAX) packet Take 17-34 g by mouth daily. Higher dose if she feels more constipated.  . pregabalin (LYRICA) 150 MG capsule Take 150-450 mg by mouth. 1 in the am and 3 in the pm  . [DISCONTINUED] canagliflozin (INVOKANA) 100 MG TABS tablet Take 100 mg by mouth daily before breakfast.  . [DISCONTINUED] Dulaglutide (TRULICITY) 1.5 AL/9.3XT SOPN Inject 1.5 mg into the skin every 7 (seven) days. Thursdays  . [DISCONTINUED] esomeprazole (NEXIUM) 40 MG capsule Take 40 mg by  mouth 2 (two) times daily.   [name] esomeprazole (NEXIUM) 40 MG capsule Take 1 capsule (40 mg total) by mouth 2 (two) times daily before a meal.  . Na Sulfate-K Sulfate-Mg Sulf (SUPREP BOWEL PREP KIT) 17.5-3.13-1.6 GM/180ML SOLN 1 suprep kit for colonoscopy     2 bottles+1 Kit  . naloxegol oxalate (MOVANTIK) 25 MG TABS tablet Take 1 tablet (25 mg total) by mouth daily.  . ranitidine (ZANTAC) 150 MG tablet Take 1 tablet (150 mg total) by mouth at bedtime.  . [DISCONTINUED] Na Sulfate-K Sulfate-Mg  Sulf (SUPREP BOWEL PREP KIT) 17.5-3.13-1.6 GM/180ML SOLN Take 1 kit by mouth once.   No facility-administered encounter medications on file as of 12/05/2016.     Allergies as of 12/05/2016 - Review Complete 12/05/2016  Allergen Reaction Noted  . Iohexol Other (See Comments) 10/20/2005  . Morphine and related Other (See Comments) 10/18/2012  . Penicillins Hives and Swelling   . Demerol Nausea And Vomiting 07/28/2011  . Victoza [liraglutide]  03/26/2015  . Dabigatran etexilate mesylate Other (See Comments) 07/28/2011  . Latex Rash 03/26/2015    Past Medical History:  Diagnosis Date  . Cataract   . Complication of anesthesia 1975   projectile vomitting  . Diabetes mellitus   . Dyslipidemia   . Dysrhythmia    hx. A.Fib-Cardioversion-converted rhythm  . Fibromyalgia   . GERD (gastroesophageal reflux disease)   . Hypertension   . MVP (mitral valve prolapse)   . OSA (obstructive sleep apnea) 10-18-12   no cpap used now.  [name] PONV (postoperative nausea and vomiting)   . Vitamin D deficiency 10-18-12    Past Surgical History:  Procedure Laterality Date  . ABDOMINAL HYSTERECTOMY     '95  . BALLOON DILATION N/A 12/03/2012   Procedure: BALLOON DILATION;  Surgeon: [name], MD;  Location: [facility];  Service: Endoscopy;  Laterality: N/A;  . CARDIOVERSION  07/28/2011   Procedure: CARDIOVERSION;  Surgeon: Sinclair Grooms, MD;  Location: [facility];  Service: Cardiovascular;  Laterality: N/A;  . CATARACT EXTRACTION Left 10-18-12   10'13  . COLONOSCOPY WITH PROPOFOL N/A 03/29/2015   Procedure: COLONOSCOPY WITH PROPOFOL;  Surgeon: [name], MD;  Location: [facility];  Service: Endoscopy;  Laterality: N/A;  . ESOPHAGEAL MANOMETRY N/A 05/19/2013   Procedure: ESOPHAGEAL MANOMETRY (EM);  Surgeon: [name], MD;  Location: [facility];  Service: Endoscopy;  Laterality: N/A;  . ESOPHAGOGASTRODUODENOSCOPY (EGD) WITH PROPOFOL N/A 12/03/2012   Procedure:  ESOPHAGOGASTRODUODENOSCOPY (EGD) WITH Dilation & w/PROPOFOL;  Surgeon: [name], MD;  Location: [facility];  Service: Endoscopy;  Laterality: N/A;  . RETINAL DETACHMENT SURGERY Bilateral 11/2016  . THYROIDECTOMY  1981  . TUBAL LIGATION      Family History  Problem Relation Age of Onset  . Hypertension Mother   . Hyperthyroidism Mother   . Diabetes type II Father   . Heart disease Father   . Atrial fibrillation Father   . Bronchitis Father   . Kidney cancer Sister     Social History   Social History  . Marital status: Married    Spouse name: N/A  . Number of children: N/A  . Years of education: N/A   Occupational History  . Not on file.   Social History Main Topics  . Smoking status: Never Smoker  . Smokeless tobacco: Never Used  . Alcohol use No  . Drug use: No  . Sexual activity: Yes   Other Topics Concern  . Not on  file   Social History Narrative  . No narrative on file      Review of systems: Review of Systems  Constitutional: Negative for fever and chills.  HENT: Negative.   Eyes: Negative for blurred vision.  Respiratory: Negative for cough, shortness of breath and wheezing.   Cardiovascular: Negative for chest pain and palpitations.  Gastrointestinal: as per HPI Genitourinary: Negative for dysuria, urgency, frequency and hematuria.  Musculoskeletal: Negative for myalgias, back pain and joint pain.  Skin: Negative for itching and rash.  Neurological: Negative for dizziness, tremors, focal weakness, seizures and loss of consciousness.  Endo/Heme/Allergies: Positive for seasonal allergies.  Psychiatric/Behavioral: Negative for depression, suicidal ideas and hallucinations.  All other systems reviewed and are negative.   Physical Exam: Vitals:   12/05/16 1434  BP: 118/62  Pulse: 74   Body mass index is 37.86 kg/m. Gen:      No acute distress HEENT:  EOMI, sclera anicteric Neck:     No masses; no thyromegaly Lungs:    Clear to  auscultation bilaterally; normal respiratory effort CV:         Regular rate and rhythm; no murmurs Abd:      + bowel sounds; soft, non-tender; no palpable masses, no distension Ext:    No edema; adequate peripheral perfusion Skin:      Warm and dry; no rash Neuro: alert and oriented x 3 Psych: normal mood and affect  Data Reviewed:  Reviewed labs, radiology imaging, old records and pertinent past GI work up   Assessment and Plan/Recommendations:  71 year old female with history of A. 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 Higher risk. Family history of colon cancer and also personal history of adenomatous polyps Last colonoscopy in 2016 was incomplete Due for colonoscopy along with EGD The risks and benefits as well as alternatives of endoscopic procedure(s) have been discussed and reviewed. All questions answered. The patient agrees to proceed. We'll discuss with cardiology if ok to hold Eliquis 48 hours prior to the procedure  Constipation: Increase dietary fiber and fluid intake Movantik 25 mg daily for narcotic-induced constipation  GERD: Continue Nexium twice daily before breakfast and dinner Zantac at bedtime Discussed antireflux measures in detail  K. Denzil Magnuson , MD [phone] Mon-Fri 8a-5p [phone] after 5p, weekends, holidays  CC: Lajean Manes, MD

## 2016-12-05 NOTE — Telephone Encounter (Signed)
Patient with diagnosis of atrial fibrillation on Eliquis for anticoagulation.    Procedure: endoscopic procedure Date of procedure: 01/10/17  CHADS2 score of 2 (HTN, DM2);  CHADS2-VASc score of  4 (HTN, DM2, AGE, female)  CrCl 46.61 Platelet count 257  Per office protocol, patient can hold Eliquis for 2 days prior to procedure.    If not bridging, patient should restart Eliquis on the evening of procedure or day after, at discretion of procedure MD

## 2016-12-05 NOTE — Telephone Encounter (Signed)
  12/05/2016   RE: HONORE WIPPERFURTH DOB: 01-02-1946 MRN: [id number]   Dear  Dr Tamala Julian   We have scheduled the above patient for an endoscopic procedure. Our records show that she is on anticoagulation therapy.   Please advise as to how long the patient may come off her therapy of Eliquis prior to the procedure, which is scheduled for 01/10/2017.  Please fax back/ or route the completed form to [facility] at [phone].   Sincerely,    Genella Mech ,CMA AAMA

## 2016-12-05 NOTE — Patient Instructions (Signed)
You have been scheduled for an endoscopy and colonoscopy. Please follow the written instructions given to you at your visit today. Please pick up your prep supplies at the pharmacy within the next 1-3 days. If you use inhalers (even only as needed), please bring them with you on the day of your procedure.   We will contact you about hold your Eliquis  We will send Movantik, Zantac and Nexium to your pharmacy  Movantik samples given  X  ORAL DIABETIC MEDICATION INSTRUCTIONS  The day before your procedure:  Take your diabetic pill as you do normally  The day of your procedure:  Do not take your diabetic pill   We will check your blood sugar levels during the admission process and again in Recovery before discharging you home  ______________________________________________________________________  X   INSULIN (LONG ACTING) MEDICATION INSTRUCTIONS (Lantus, NPH, 70/30, Humulin, Novolin-N, Levemir, Toujeo )   The day before your procedure:  Take  your regular evening dose    The day of your procedure:  Do not take your morning dose   X   INSULIN (SHORT ACTING) MEDICATION INSTRUCTIONS (Regular, Humulog, Novolog, Apidra, Novolin, Humulin)   The day before your procedure:  Do not take your evening dose   The day of your procedure:  Do not take your morning dose  ______________________________________________________________________                X_ OTHER NON-INSULIN INJECTABLE MEDICATIONS         (Tanzeum, Trulicity, Byetta, Victoza, Bydureon, SymlinPen)  Hold the am of the procedure

## 2016-12-06 NOTE — Telephone Encounter (Signed)
Patient ok to hold plavix for 2 days  L/M for patient to contact the office if she has any questions

## 2016-12-18 DIAGNOSIS — R2231 Localized swelling, mass and lump, right upper limb: Secondary | ICD-10-CM | POA: Diagnosis not present

## 2016-12-28 ENCOUNTER — Telehealth: Payer: Self-pay | Admitting: Interventional Cardiology

## 2016-12-28 NOTE — Telephone Encounter (Signed)
°*  STAT* If patient is at the pharmacy, call can be transferred to refill team.   1. Which medications need to be refilled? (please list name of each medication and dose if known) metoprolol 25mg  1 2xday  2. Which pharmacy/location (including street and city if local pharmacy) is medication to be sent to? [place]  [phone] 3. Do they need a 30 day or 90 day supply? [place]

## 2017-01-03 ENCOUNTER — Other Ambulatory Visit: Payer: Self-pay

## 2017-01-03 MED ORDER — FUROSEMIDE 20 MG PO TABS: 10.0000 mg | ORAL_TABLET | Freq: Every day | ORAL | 3 refills | Status: DC | PRN

## 2017-01-04 ENCOUNTER — Other Ambulatory Visit: Payer: Self-pay | Admitting: *Deleted

## 2017-01-04 MED ORDER — METOPROLOL TARTRATE 25 MG PO TABS: 25.0000 mg | ORAL_TABLET | Freq: Two times a day (BID) | ORAL | 1 refills | Status: DC

## 2017-01-10 ENCOUNTER — Encounter: Payer: Medicare Other | Admitting: Gastroenterology

## 2017-01-12 MED ORDER — FUROSEMIDE 20 MG PO TABS: 10.0000 mg | ORAL_TABLET | Freq: Every day | ORAL | 3 refills | Status: DC | PRN

## 2017-01-30 DIAGNOSIS — R5382 Chronic fatigue, unspecified: Secondary | ICD-10-CM | POA: Diagnosis not present

## 2017-01-30 DIAGNOSIS — F331 Major depressive disorder, recurrent, moderate: Secondary | ICD-10-CM | POA: Diagnosis not present

## 2017-01-30 DIAGNOSIS — E1121 Type 2 diabetes mellitus with diabetic nephropathy: Secondary | ICD-10-CM | POA: Diagnosis not present

## 2017-01-30 DIAGNOSIS — N183 Chronic kidney disease, stage 3 (moderate): Secondary | ICD-10-CM | POA: Diagnosis not present

## 2017-01-30 DIAGNOSIS — E7800 Pure hypercholesterolemia, unspecified: Secondary | ICD-10-CM | POA: Diagnosis not present

## 2017-01-30 DIAGNOSIS — M797 Fibromyalgia: Secondary | ICD-10-CM | POA: Diagnosis not present

## 2017-01-30 DIAGNOSIS — Z23 Encounter for immunization: Secondary | ICD-10-CM | POA: Diagnosis not present

## 2017-01-30 DIAGNOSIS — Z0000 Encounter for general adult medical examination without abnormal findings: Secondary | ICD-10-CM | POA: Diagnosis not present

## 2017-01-30 DIAGNOSIS — M542 Cervicalgia: Secondary | ICD-10-CM | POA: Diagnosis not present

## 2017-01-30 DIAGNOSIS — Z79899 Other long term (current) drug therapy: Secondary | ICD-10-CM | POA: Diagnosis not present

## 2017-01-30 DIAGNOSIS — E1142 Type 2 diabetes mellitus with diabetic polyneuropathy: Secondary | ICD-10-CM | POA: Diagnosis not present

## 2017-01-30 DIAGNOSIS — R0789 Other chest pain: Secondary | ICD-10-CM | POA: Diagnosis not present

## 2017-01-30 DIAGNOSIS — K220 Achalasia of cardia: Secondary | ICD-10-CM | POA: Diagnosis not present

## 2017-01-30 NOTE — Progress Notes (Signed)
Cardiology Office Note    Date:  01/31/2017   ID:  Tawney, Vanorman 11-16-1945, MRN [id number]  PCP:  Lajean Manes, MD  Cardiologist: Sinclair Grooms, MD   Chief Complaint  Patient presents with  . Atrial Fibrillation    History of Present Illness:  [name] is a 71 y.o. female female with a history of PAF on amio and Eliquis, DM, MVP, HLD, fibromyalgia, GERD and OSA.. chads vasc 3.   The patient is doing well. She has had no symptoms that suggests episodes of atrial fibrillation. She will have lower extremity swelling if she is on her feet a lot. She denies orthopnea, PND, chest pain, but gets transient dizziness with standing. Has been running relatively low blood pressures. Appetite is been stable.  Past Medical History:  Diagnosis Date  . Cataract   . Complication of anesthesia 1975   projectile vomitting  . Diabetes mellitus   . Dyslipidemia   . Dysrhythmia    hx. A.Fib-Cardioversion-converted rhythm  . Fibromyalgia   . GERD (gastroesophageal reflux disease)   . Hypertension   . MVP (mitral valve prolapse)   . OSA (obstructive sleep apnea) 10-18-12   no cpap used now.  [name] PONV (postoperative nausea and vomiting)   . Vitamin D deficiency 10-18-12    Past Surgical History:  Procedure Laterality Date  . ABDOMINAL HYSTERECTOMY     '95  . BALLOON DILATION N/A 12/03/2012   Procedure: BALLOON DILATION;  Surgeon: [name], MD;  Location: [facility];  Service: Endoscopy;  Laterality: N/A;  . CARDIOVERSION  07/28/2011   Procedure: CARDIOVERSION;  Surgeon: Sinclair Grooms, MD;  Location: [facility];  Service: Cardiovascular;  Laterality: N/A;  . CATARACT EXTRACTION Left 10-18-12   10'13  . COLONOSCOPY WITH PROPOFOL N/A 03/29/2015   Procedure: COLONOSCOPY WITH PROPOFOL;  Surgeon: [name], MD;  Location: [facility];  Service: Endoscopy;  Laterality: N/A;  . ESOPHAGEAL MANOMETRY N/A 05/19/2013   Procedure: ESOPHAGEAL MANOMETRY (EM);  Surgeon:  [name], MD;  Location: [facility];  Service: Endoscopy;  Laterality: N/A;  . ESOPHAGOGASTRODUODENOSCOPY (EGD) WITH PROPOFOL N/A 12/03/2012   Procedure: ESOPHAGOGASTRODUODENOSCOPY (EGD) WITH Dilation & w/PROPOFOL;  Surgeon: [name], MD;  Location: [facility];  Service: Endoscopy;  Laterality: N/A;  . RETINAL DETACHMENT SURGERY Bilateral 11/2016  . THYROIDECTOMY  1981  . TUBAL LIGATION      Current Medications: Outpatient Medications Prior to Visit  Medication Sig Dispense Refill  . acetaminophen (TYLENOL) 500 MG tablet Take 500 mg by mouth every 6 (six) hours as needed for mild pain or fever.     [name] amiodarone (PACERONE) 200 MG tablet Take 0.5 tablets (100 mg total) by mouth daily. 45 tablet 3  . amphetamine-dextroamphetamine (ADDERALL) 20 MG tablet Take 20 mg by mouth 3 (three) times daily.     [name] atorvastatin (LIPITOR) 80 MG tablet Take 80 mg by mouth at bedtime.     . BD PEN NEEDLE NANO U/F 32G X 4 MM MISC     . Cholecalciferol (VITAMIN D) 2000 UNITS tablet Take 2,000 Units by mouth daily.    . DULoxetine (CYMBALTA) 60 MG capsule Take 60 mg by mouth 2 (two) times daily.    [name] esomeprazole (NEXIUM) 40 MG capsule Take 1 capsule (40 mg total) by mouth 2 (two) times daily before a meal. 180 capsule 3  . furosemide (LASIX) 20 MG tablet Take 0.5 tablets (10 mg total) by mouth  daily as needed for fluid. 45 tablet 3  . Insulin Glargine (BASAGLAR KWIKPEN) 100 UNIT/ML SOPN Inject 110 Units into the skin daily.    . insulin lispro (HUMALOG) 100 UNIT/ML injection Inject 20 Units into the skin 3 (three) times daily.     [name] LORazepam (ATIVAN) 1 MG tablet Take 1 mg by mouth 2 (two) times daily as needed for anxiety.     . metoprolol tartrate (LOPRESSOR) 25 MG tablet Take 1 tablet (25 mg total) by mouth 2 (two) times daily. 180 tablet 1  . Na Sulfate-K Sulfate-Mg Sulf (SUPREP BOWEL PREP KIT) 17.5-3.13-1.6 GM/180ML SOLN 1 suprep kit for colonoscopy     2 bottles+1 Kit 1 Bottle 0  .  naloxegol oxalate (MOVANTIK) 25 MG TABS tablet Take 1 tablet (25 mg total) by mouth daily. 90 tablet 3  . nitroGLYCERIN (NITROSTAT) 0.4 MG SL tablet Place 0.4 mg under the tongue every 5 (five) minutes as needed. For chest pain from esophageal spasms    . oxyCODONE (OXY IR/ROXICODONE) 5 MG immediate release tablet Take 5 mg by mouth every 6 (six) hours as needed (for breakthrough pain).    [name] oxyCODONE (OXYCONTIN) 40 MG 12 hr tablet Take 40 mg by mouth every 8 (eight) hours.     . polyethylene glycol (MIRALAX / GLYCOLAX) packet Take 17-34 g by mouth daily. Higher dose if she feels more constipated.    . pregabalin (LYRICA) 150 MG capsule Take 150-450 mg by mouth. 1 in the am and 3 in the pm    . ranitidine (ZANTAC) 150 MG tablet Take 1 tablet (150 mg total) by mouth at bedtime. 90 tablet 3  . ELIQUIS 5 MG TABS tablet Take 5 mg by mouth 2 (two) times daily.      No facility-administered medications prior to visit.      Allergies:   Iohexol; Morphine and related; Penicillins; Demerol; Victoza [liraglutide]; Dabigatran etexilate mesylate; and Latex   Social History   Social History  . Marital status: Married    Spouse name: N/A  . Number of children: N/A  . Years of education: N/A   Social History Main Topics  . Smoking status: Never Smoker  . Smokeless tobacco: Never Used  . Alcohol use No  . Drug use: No  . Sexual activity: Yes   Other Topics Concern  . Not on file   Social History Narrative  . No narrative on file     Family History:  The patient's family history includes Atrial fibrillation in her father; Bronchitis in her father; Diabetes type II in her father; Heart disease in her father; Hypertension in her mother; Hyperthyroidism in her mother; Kidney cancer in her sister.   ROS:   Please see the history of present illness.    She has knee and back discomfort. She has mild dizziness with standing. This is not always present. Puffiness and feet on occasion.  All other  systems reviewed and are negative.   PHYSICAL EXAM:   VS:  BP 104/66 (BP Location: Left Arm)   Pulse 77   Ht '5\' 4"'  (1.626 m)   Wt 220 lb 6.4 oz (100 kg)   BMI 37.83 kg/m    GEN: Well nourished, well developed, in no acute distress  HEENT: normal  Neck: no JVD, carotid bruits, or masses Cardiac: RRR; no murmurs, rubs, or gallops,no edema  Respiratory:  clear to auscultation bilaterally, normal work of breathing GI: soft, nontender, nondistended, + BS MS: no deformity or atrophy  Skin: warm and dry, no rash Neuro:  Alert and Oriented x 3, Strength and sensation are intact Psych: euthymic mood, full affect  Wt Readings from Last 3 Encounters:  01/31/17 220 lb 6.4 oz (100 kg)  12/05/16 227 lb 8 oz (103.2 kg)  09/08/16 210 lb (95.3 kg)      Studies/Labs Reviewed:   EKG:  EKG  Normal sinus rhythm with normal EKG appearance  Recent Labs: 07/14/2016: ALT 19; TSH 3.560   Lipid Panel No results found for: CHOL, TRIG, HDL, CHOLHDL, VLDL, LDLCALC, LDLDIRECT  Additional studies/ records that were reviewed today include:  Recent blood work drawn by Dr. Felipa Eth included a comprehensive metabolic panel. The results are not yet known. TSH was not done.    ASSESSMENT:    1. Paroxysmal atrial fibrillation (HCC)   2. Essential hypertension   3. On amiodarone therapy   4. Anticoagulation goal of INR 2 to 3      PLAN:  In order of problems listed above:  1. No clinical recurrence of atrial fibrillation on low-dose amiodarone, 100 mg per day. TSH OB drawn to complement the comprehensive metabolic panel done by Dr. Felipa Eth yesterday. A PA and lateral chest x-ray will be done today. When she returns for follow-up in 6 months a repeat TSH and liver panel will be ordered. 2. Blood pressure is relatively low and there are some orthostatic complaints. Plan to decrease metoprolol tartrate to 12.5 mg twice a day. If her blood pressure is consistently run above 130/90 mmHg she will need to  go back to the higher dose. She will monitor this closely. 3. TSH and PA and lateral chest x-ray are being done today as noted above. 4. Continue eloquent's. Monitor for evidence of bleeding.  Clinical follow-up in 6 months. TSH and liver panel at that time.    Medication Adjustments/Labs and Tests Ordered: Current medicines are reviewed at length with the patient today.  Concerns regarding medicines are outlined above.  Medication changes, Labs and Tests ordered today are listed in the Patient Instructions below. Patient Instructions  Medication Instructions:  Your physician recommends that you continue on your current medications as directed. Please refer to the Current Medication list given to you today.   Labwork: TSH today  Testing/Procedures: A chest x-ray takes a picture of the organs and structures inside the chest, including the heart, lungs, and blood vessels. This test can show several things, including, whether the heart is enlarges; whether fluid is building up in the lungs; and whether pacemaker / defibrillator leads are still in place.   Follow-Up: Your physician wants you to follow-up in: 6 months with Dr. Tamala Julian. You will receive a reminder letter in the mail two months in advance. If you don't receive a letter, please call our office to schedule the follow-up appointment.   Any Other Special Instructions Will Be Listed Below (If Applicable).     If you need a refill on your cardiac medications before your next appointment, please call your pharmacy.      Signed, Sinclair Grooms, MD  01/31/2017 11:54 AM    [facility] [address], [place], [place]  [address] Phone: [phone]; Fax: [phone]

## 2017-01-31 ENCOUNTER — Ambulatory Visit
Admission: RE | Admit: 2017-01-31 | Discharge: 2017-01-31 | Disposition: A | Discharge status: Home / Self Care | Payer: Medicare Other | Source: Ambulatory Visit | Attending: Interventional Cardiology | Admitting: Interventional Cardiology

## 2017-01-31 ENCOUNTER — Ambulatory Visit (INDEPENDENT_AMBULATORY_CARE_PROVIDER_SITE_OTHER): Payer: Medicare Other | Admitting: Interventional Cardiology

## 2017-01-31 ENCOUNTER — Other Ambulatory Visit: Payer: Self-pay | Admitting: Geriatric Medicine

## 2017-01-31 ENCOUNTER — Encounter: Payer: Self-pay | Admitting: Interventional Cardiology

## 2017-01-31 ENCOUNTER — Ambulatory Visit
Admission: RE | Admit: 2017-01-31 | Discharge: 2017-01-31 | Disposition: A | Discharge status: Home / Self Care | Payer: Medicare Other | Source: Ambulatory Visit | Attending: Geriatric Medicine | Admitting: Geriatric Medicine

## 2017-01-31 VITALS — BP 104/66 | HR 77 | Ht 64.0 in | Wt 220.4 lb

## 2017-01-31 DIAGNOSIS — Z5181 Encounter for therapeutic drug level monitoring: Secondary | ICD-10-CM

## 2017-01-31 DIAGNOSIS — Z79899 Other long term (current) drug therapy: Secondary | ICD-10-CM

## 2017-01-31 DIAGNOSIS — N183 Chronic kidney disease, stage 3 unspecified: Secondary | ICD-10-CM

## 2017-01-31 DIAGNOSIS — R0789 Other chest pain: Secondary | ICD-10-CM

## 2017-01-31 DIAGNOSIS — I4891 Unspecified atrial fibrillation: Secondary | ICD-10-CM | POA: Diagnosis not present

## 2017-01-31 DIAGNOSIS — Z7901 Long term (current) use of anticoagulants: Secondary | ICD-10-CM

## 2017-01-31 DIAGNOSIS — I480 Paroxysmal atrial fibrillation: Secondary | ICD-10-CM

## 2017-01-31 DIAGNOSIS — R072 Precordial pain: Secondary | ICD-10-CM | POA: Diagnosis not present

## 2017-01-31 DIAGNOSIS — I10 Essential (primary) hypertension: Secondary | ICD-10-CM | POA: Diagnosis not present

## 2017-01-31 LAB — TSH: TSH: 3.1 u[IU]/mL (ref 0.450–4.500)

## 2017-01-31 MED ORDER — ELIQUIS 5 MG PO TABS: 5.0000 mg | ORAL_TABLET | Freq: Two times a day (BID) | ORAL | 3 refills | Status: DC

## 2017-01-31 MED ORDER — ELIQUIS 5 MG PO TABS: 5.0000 mg | ORAL_TABLET | Freq: Two times a day (BID) | ORAL | 11 refills | Status: DC

## 2017-01-31 NOTE — Patient Instructions (Signed)
Medication Instructions:  Your physician recommends that you continue on your current medications as directed. Please refer to the Current Medication list given to you today.   Labwork: TSH today  Testing/Procedures: A chest x-ray takes a picture of the organs and structures inside the chest, including the heart, lungs, and blood vessels. This test can show several things, including, whether the heart is enlarges; whether fluid is building up in the lungs; and whether pacemaker / defibrillator leads are still in place.   Follow-Up: Your physician wants you to follow-up in: 6 months with Dr. Tamala Julian. You will receive a reminder letter in the mail two months in advance. If you don't receive a letter, please call our office to schedule the follow-up appointment.   Any Other Special Instructions Will Be Listed Below (If Applicable).     If you need a refill on your cardiac medications before your next appointment, please call your pharmacy.

## 2017-02-02 ENCOUNTER — Ambulatory Visit: Payer: Medicare Other | Admitting: Interventional Cardiology

## 2017-02-05 DIAGNOSIS — M2012 Hallux valgus (acquired), left foot: Secondary | ICD-10-CM | POA: Diagnosis not present

## 2017-02-05 DIAGNOSIS — M79671 Pain in right foot: Secondary | ICD-10-CM | POA: Diagnosis not present

## 2017-02-05 DIAGNOSIS — M2011 Hallux valgus (acquired), right foot: Secondary | ICD-10-CM | POA: Diagnosis not present

## 2017-02-14 ENCOUNTER — Telehealth: Payer: Self-pay | Admitting: Interventional Cardiology

## 2017-02-14 NOTE — Telephone Encounter (Signed)
    Chart reviewed as part of pre-operative protocol coverage. Patient was contacted 02/14/2017 in reference to pre-operative risk assessment for pending surgery as outlined below.  [name] was last seen on 01/31/17* by Dr. Tamala Julian.  Since that day, [name] has done well w/o cardiac symptoms. No exertional CP or dyspnea.   Therefore, based on ACC/AHA guidelines, the patient would be at acceptable risk for the planned procedure without further cardiovascular testing.   Pharmacy to provide recs regarding Eliquis. Pt on [place] for stroke prophylaxis given PAF.   Lyda Jester, PA-C 02/14/2017, 1:55 PM

## 2017-02-14 NOTE — Telephone Encounter (Signed)
Pt takes Eliquis for afib with CHADS2VASc score of 4 (age, sex, HTN, DM). Renal function is normal - SCr 1 on 09/25/16 in Care Everywhere from [facility]. Ok to hold Eliquis for 2 days prior to procedure.

## 2017-02-14 NOTE — Telephone Encounter (Signed)
New message       [facility] Pre-operative Risk Assessment    Request for surgical clearance:  1. What type of surgery is being performed? Foot Surgery, Left foot   2. When is this surgery scheduled? Tuesday 02/20/17  3. Are there any medications that need to be held prior to surgery and how long?  Need clearance  4. Practice name and name of physician performing surgery? Dr. Donnie Mesa    5. What is your office phone and fax number? Phone- [phone] Fax- [phone]   6. Anesthesia type (None, local, MAC, general) ? IVA    [name] 02/14/2017, 1:39 PM  _________________________________________________________________   (provider comments below)

## 2017-02-15 ENCOUNTER — Ambulatory Visit
Admission: RE | Admit: 2017-02-15 | Discharge: 2017-02-15 | Disposition: A | Discharge status: Home / Self Care | Payer: Medicare Other | Source: Ambulatory Visit | Attending: Geriatric Medicine | Admitting: Geriatric Medicine

## 2017-02-15 DIAGNOSIS — Z1231 Encounter for screening mammogram for malignant neoplasm of breast: Secondary | ICD-10-CM

## 2017-02-16 NOTE — Telephone Encounter (Signed)
Faxed to Dr Waldron [facility] office via [employer].

## 2017-03-26 ENCOUNTER — Other Ambulatory Visit: Payer: Self-pay | Admitting: Physician Assistant

## 2017-03-26 DIAGNOSIS — I480 Paroxysmal atrial fibrillation: Secondary | ICD-10-CM

## 2017-04-16 DIAGNOSIS — F321 Major depressive disorder, single episode, moderate: Secondary | ICD-10-CM | POA: Diagnosis not present

## 2017-05-18 DIAGNOSIS — M791 Myalgia, unspecified site: Secondary | ICD-10-CM | POA: Diagnosis not present

## 2017-05-18 DIAGNOSIS — I9589 Other hypotension: Secondary | ICD-10-CM | POA: Diagnosis not present

## 2017-05-18 DIAGNOSIS — M25511 Pain in right shoulder: Secondary | ICD-10-CM | POA: Diagnosis not present

## 2017-05-18 DIAGNOSIS — F331 Major depressive disorder, recurrent, moderate: Secondary | ICD-10-CM | POA: Diagnosis not present

## 2017-05-21 DIAGNOSIS — N183 Chronic kidney disease, stage 3 (moderate): Secondary | ICD-10-CM | POA: Diagnosis not present

## 2017-05-21 DIAGNOSIS — E1142 Type 2 diabetes mellitus with diabetic polyneuropathy: Secondary | ICD-10-CM | POA: Diagnosis not present

## 2017-05-21 DIAGNOSIS — Z794 Long term (current) use of insulin: Secondary | ICD-10-CM | POA: Diagnosis not present

## 2017-05-28 DIAGNOSIS — G5601 Carpal tunnel syndrome, right upper limb: Secondary | ICD-10-CM | POA: Diagnosis not present

## 2017-05-28 DIAGNOSIS — G5603 Carpal tunnel syndrome, bilateral upper limbs: Secondary | ICD-10-CM | POA: Diagnosis not present

## 2017-05-28 DIAGNOSIS — M7711 Lateral epicondylitis, right elbow: Secondary | ICD-10-CM | POA: Diagnosis not present

## 2017-05-28 DIAGNOSIS — M1812 Unilateral primary osteoarthritis of first carpometacarpal joint, left hand: Secondary | ICD-10-CM | POA: Diagnosis not present

## 2017-05-28 DIAGNOSIS — M7541 Impingement syndrome of right shoulder: Secondary | ICD-10-CM | POA: Diagnosis not present

## 2017-05-28 DIAGNOSIS — M180 Bilateral primary osteoarthritis of first carpometacarpal joints: Secondary | ICD-10-CM | POA: Diagnosis not present

## 2017-06-06 DIAGNOSIS — R2689 Other abnormalities of gait and mobility: Secondary | ICD-10-CM | POA: Diagnosis not present

## 2017-06-06 DIAGNOSIS — M6281 Muscle weakness (generalized): Secondary | ICD-10-CM | POA: Diagnosis not present

## 2017-06-06 DIAGNOSIS — M25511 Pain in right shoulder: Secondary | ICD-10-CM | POA: Diagnosis not present

## 2017-06-08 ENCOUNTER — Telehealth: Payer: Self-pay | Admitting: Interventional Cardiology

## 2017-06-08 DIAGNOSIS — R2689 Other abnormalities of gait and mobility: Secondary | ICD-10-CM | POA: Diagnosis not present

## 2017-06-08 DIAGNOSIS — M25511 Pain in right shoulder: Secondary | ICD-10-CM | POA: Diagnosis not present

## 2017-06-08 DIAGNOSIS — M6281 Muscle weakness (generalized): Secondary | ICD-10-CM | POA: Diagnosis not present

## 2017-06-08 NOTE — Telephone Encounter (Signed)
Patient is going to [place] the end of March 2019 and wants to know if there is anything relating to her heart condition that she should be concerned about with traveling to [place]. Patient advised that her provider is out of the office today but the message would be sent to him and once he responds, she would receive a call back. Patient verbalized understanding of plan.

## 2017-06-08 NOTE — Telephone Encounter (Signed)
New message   Pt would like an appt before her trip to [place] on 3/31 because of her afib,and also has questions for nurse. Please call

## 2017-06-10 NOTE — Telephone Encounter (Signed)
No specific concerns. Don't run out of her medications.

## 2017-06-11 DIAGNOSIS — M25511 Pain in right shoulder: Secondary | ICD-10-CM | POA: Diagnosis not present

## 2017-06-11 DIAGNOSIS — M6281 Muscle weakness (generalized): Secondary | ICD-10-CM | POA: Diagnosis not present

## 2017-06-11 DIAGNOSIS — M13842 Other specified arthritis, left hand: Secondary | ICD-10-CM | POA: Diagnosis not present

## 2017-06-11 DIAGNOSIS — G5601 Carpal tunnel syndrome, right upper limb: Secondary | ICD-10-CM | POA: Diagnosis not present

## 2017-06-11 DIAGNOSIS — M7541 Impingement syndrome of right shoulder: Secondary | ICD-10-CM | POA: Diagnosis not present

## 2017-06-11 DIAGNOSIS — R2689 Other abnormalities of gait and mobility: Secondary | ICD-10-CM | POA: Diagnosis not present

## 2017-06-11 NOTE — Telephone Encounter (Signed)
Patient informed and verbalized understanding

## 2017-06-15 DIAGNOSIS — M6281 Muscle weakness (generalized): Secondary | ICD-10-CM | POA: Diagnosis not present

## 2017-06-15 DIAGNOSIS — R2689 Other abnormalities of gait and mobility: Secondary | ICD-10-CM | POA: Diagnosis not present

## 2017-06-15 DIAGNOSIS — M25511 Pain in right shoulder: Secondary | ICD-10-CM | POA: Diagnosis not present

## 2017-06-19 DIAGNOSIS — R2689 Other abnormalities of gait and mobility: Secondary | ICD-10-CM | POA: Diagnosis not present

## 2017-06-19 DIAGNOSIS — H2511 Age-related nuclear cataract, right eye: Secondary | ICD-10-CM | POA: Diagnosis not present

## 2017-06-19 DIAGNOSIS — M25511 Pain in right shoulder: Secondary | ICD-10-CM | POA: Diagnosis not present

## 2017-06-19 DIAGNOSIS — M6281 Muscle weakness (generalized): Secondary | ICD-10-CM | POA: Diagnosis not present

## 2017-06-19 DIAGNOSIS — Z794 Long term (current) use of insulin: Secondary | ICD-10-CM | POA: Diagnosis not present

## 2017-06-19 DIAGNOSIS — E119 Type 2 diabetes mellitus without complications: Secondary | ICD-10-CM | POA: Diagnosis not present

## 2017-06-25 DIAGNOSIS — K047 Periapical abscess without sinus: Secondary | ICD-10-CM | POA: Diagnosis not present

## 2017-06-25 DIAGNOSIS — J069 Acute upper respiratory infection, unspecified: Secondary | ICD-10-CM | POA: Diagnosis not present

## 2017-06-26 DIAGNOSIS — M25511 Pain in right shoulder: Secondary | ICD-10-CM | POA: Diagnosis not present

## 2017-06-26 DIAGNOSIS — M6281 Muscle weakness (generalized): Secondary | ICD-10-CM | POA: Diagnosis not present

## 2017-06-26 DIAGNOSIS — R2689 Other abnormalities of gait and mobility: Secondary | ICD-10-CM | POA: Diagnosis not present

## 2017-07-03 DIAGNOSIS — M6281 Muscle weakness (generalized): Secondary | ICD-10-CM | POA: Diagnosis not present

## 2017-07-03 DIAGNOSIS — M25511 Pain in right shoulder: Secondary | ICD-10-CM | POA: Diagnosis not present

## 2017-07-03 DIAGNOSIS — R2689 Other abnormalities of gait and mobility: Secondary | ICD-10-CM | POA: Diagnosis not present

## 2017-07-10 DIAGNOSIS — R2689 Other abnormalities of gait and mobility: Secondary | ICD-10-CM | POA: Diagnosis not present

## 2017-07-10 DIAGNOSIS — M25511 Pain in right shoulder: Secondary | ICD-10-CM | POA: Diagnosis not present

## 2017-07-10 DIAGNOSIS — M6281 Muscle weakness (generalized): Secondary | ICD-10-CM | POA: Diagnosis not present

## 2017-07-11 ENCOUNTER — Telehealth: Payer: Self-pay | Admitting: Gastroenterology

## 2017-07-11 NOTE — Telephone Encounter (Signed)
Pt cannot rf Nexium because a new prescription with new dosage needs to be sent or called in to pharmacy. Pt stated that [facility] phone # is [phone], she was told that we can call directly.

## 2017-07-11 NOTE — Telephone Encounter (Signed)
Medication Nexium approved for twice a day from 06/11/2017 until 07/11/2018  Patient aware

## 2017-07-12 ENCOUNTER — Other Ambulatory Visit: Payer: Self-pay | Admitting: Interventional Cardiology

## 2017-07-12 DIAGNOSIS — M25511 Pain in right shoulder: Secondary | ICD-10-CM | POA: Diagnosis not present

## 2017-07-12 DIAGNOSIS — R2689 Other abnormalities of gait and mobility: Secondary | ICD-10-CM | POA: Diagnosis not present

## 2017-07-12 DIAGNOSIS — M6281 Muscle weakness (generalized): Secondary | ICD-10-CM | POA: Diagnosis not present

## 2017-07-12 MED ORDER — METOPROLOL TARTRATE 25 MG PO TABS: 25.0000 mg | ORAL_TABLET | Freq: Two times a day (BID) | ORAL | 1 refills | Status: DC

## 2017-07-16 DIAGNOSIS — Z794 Long term (current) use of insulin: Secondary | ICD-10-CM | POA: Diagnosis not present

## 2017-07-16 DIAGNOSIS — N183 Chronic kidney disease, stage 3 (moderate): Secondary | ICD-10-CM | POA: Diagnosis not present

## 2017-07-16 DIAGNOSIS — Z6837 Body mass index (BMI) 37.0-37.9, adult: Secondary | ICD-10-CM | POA: Diagnosis not present

## 2017-07-16 DIAGNOSIS — E1121 Type 2 diabetes mellitus with diabetic nephropathy: Secondary | ICD-10-CM | POA: Diagnosis not present

## 2017-07-19 DIAGNOSIS — Z794 Long term (current) use of insulin: Secondary | ICD-10-CM | POA: Diagnosis not present

## 2017-07-19 DIAGNOSIS — N183 Chronic kidney disease, stage 3 (moderate): Secondary | ICD-10-CM | POA: Diagnosis not present

## 2017-07-19 DIAGNOSIS — E1142 Type 2 diabetes mellitus with diabetic polyneuropathy: Secondary | ICD-10-CM | POA: Diagnosis not present

## 2017-08-23 ENCOUNTER — Ambulatory Visit (INDEPENDENT_AMBULATORY_CARE_PROVIDER_SITE_OTHER): Payer: Medicare Other | Admitting: Interventional Cardiology

## 2017-08-23 ENCOUNTER — Encounter: Payer: Self-pay | Admitting: Interventional Cardiology

## 2017-08-23 VITALS — BP 90/62 | HR 68 | Ht 65.0 in | Wt 218.4 lb

## 2017-08-23 DIAGNOSIS — E782 Mixed hyperlipidemia: Secondary | ICD-10-CM

## 2017-08-23 DIAGNOSIS — Z79899 Other long term (current) drug therapy: Secondary | ICD-10-CM | POA: Diagnosis not present

## 2017-08-23 DIAGNOSIS — Z5181 Encounter for therapeutic drug level monitoring: Secondary | ICD-10-CM | POA: Diagnosis not present

## 2017-08-23 DIAGNOSIS — Z7901 Long term (current) use of anticoagulants: Secondary | ICD-10-CM | POA: Diagnosis not present

## 2017-08-23 DIAGNOSIS — G4733 Obstructive sleep apnea (adult) (pediatric): Secondary | ICD-10-CM | POA: Diagnosis not present

## 2017-08-23 DIAGNOSIS — I10 Essential (primary) hypertension: Secondary | ICD-10-CM

## 2017-08-23 DIAGNOSIS — I480 Paroxysmal atrial fibrillation: Secondary | ICD-10-CM | POA: Diagnosis not present

## 2017-08-23 NOTE — Patient Instructions (Signed)
Medication Instructions:  Your physician recommends that you continue on your current medications as directed. Please refer to the Current Medication list given to you today.  Labwork: TSH, liver and BMET today  Testing/Procedures: None  Follow-Up: Your physician wants you to follow-up in: 6 months with Dr. Tamala Julian.  You will receive a reminder letter in the mail two months in advance. If you don't receive a letter, please call our office to schedule the follow-up appointment.   Any Other Special Instructions Will Be Listed Below (If Applicable).     If you need a refill on your cardiac medications before your next appointment, please call your pharmacy.

## 2017-08-23 NOTE — Progress Notes (Signed)
Cardiology Office Note    Date:  08/23/2017   ID:  Allison, Nichols Apr 14, 1946, MRN [id number]  PCP:  Lajean Manes, MD  Cardiologist: Sinclair Grooms, MD   Chief Complaint  Patient presents with  . Atrial Fibrillation    History of Present Illness:  [name] is a 72 y.o. female with a history of PAF on amio and Eliquis, DM, MVP, HLD, fibromyalgia, GERD and OSA.. chads vasc 3.   Overall she is doing well.  She had a recent trip to [place].  She was there visiting her daughter and 2 grandchildren.  She was gone for nearly a month.  She has some lower extremity swelling but no shortness of breath.  No episodes of atrial fibrillation or other issues.  Her appetite is good.  She denies chest pain.   Past Medical History:  Diagnosis Date  . Cataract   . Complication of anesthesia 1975   projectile vomitting  . Diabetes mellitus   . Dyslipidemia   . Dysrhythmia    hx. A.Fib-Cardioversion-converted rhythm  . Fibromyalgia   . GERD (gastroesophageal reflux disease)   . Hypertension   . MVP (mitral valve prolapse)   . OSA (obstructive sleep apnea) 10-18-12   no cpap used now.  [name] PONV (postoperative nausea and vomiting)   . Vitamin D deficiency 10-18-12    Past Surgical History:  Procedure Laterality Date  . ABDOMINAL HYSTERECTOMY     '95  . BALLOON DILATION N/A 12/03/2012   Procedure: BALLOON DILATION;  Surgeon: [name], MD;  Location: [facility];  Service: Endoscopy;  Laterality: N/A;  . BREAST EXCISIONAL BIOPSY Left   . CARDIOVERSION  07/28/2011   Procedure: CARDIOVERSION;  Surgeon: Sinclair Grooms, MD;  Location: [facility];  Service: Cardiovascular;  Laterality: N/A;  . CATARACT EXTRACTION Left 10-18-12   10'13  . COLONOSCOPY WITH PROPOFOL N/A 03/29/2015   Procedure: COLONOSCOPY WITH PROPOFOL;  Surgeon: [name], MD;  Location: [facility];  Service: Endoscopy;  Laterality: N/A;  . ESOPHAGEAL MANOMETRY N/A 05/19/2013   Procedure: ESOPHAGEAL  MANOMETRY (EM);  Surgeon: [name], MD;  Location: [facility];  Service: Endoscopy;  Laterality: N/A;  . ESOPHAGOGASTRODUODENOSCOPY (EGD) WITH PROPOFOL N/A 12/03/2012   Procedure: ESOPHAGOGASTRODUODENOSCOPY (EGD) WITH Dilation & w/PROPOFOL;  Surgeon: [name], MD;  Location: [facility];  Service: Endoscopy;  Laterality: N/A;  . RETINAL DETACHMENT SURGERY Bilateral 11/2016  . THYROIDECTOMY  1981  . TUBAL LIGATION      Current Medications: Outpatient Medications Prior to Visit  Medication Sig Dispense Refill  . acetaminophen (TYLENOL) 500 MG tablet Take 500 mg by mouth every 6 (six) hours as needed for mild pain or fever.     [name] amiodarone (PACERONE) 200 MG tablet TAKE 1/2 TABLET BY MOUTH DAILY 45 tablet 3  . amphetamine-dextroamphetamine (ADDERALL) 20 MG tablet Take 20 mg by mouth 3 (three) times daily.     [name] atorvastatin (LIPITOR) 80 MG tablet Take 80 mg by mouth at bedtime.     . BD PEN NEEDLE NANO U/F 32G X 4 MM MISC     . buPROPion (WELLBUTRIN SR) 150 MG 12 hr tablet Take 150 mg by mouth 2 (two) times daily.  5  . Cholecalciferol (VITAMIN D) 2000 UNITS tablet Take 2,000 Units by mouth daily.    [name] ELIQUIS 5 MG TABS tablet Take 1 tablet (5 mg total) by mouth 2 (two) times daily. 180 tablet 3  .  esomeprazole (NEXIUM) 40 MG capsule Take 1 capsule (40 mg total) by mouth 2 (two) times daily before a meal. 180 capsule 3  . furosemide (LASIX) 20 MG tablet Take 0.5 tablets (10 mg total) by mouth daily as needed for fluid. 45 tablet 3  . Insulin Glargine (BASAGLAR KWIKPEN) 100 UNIT/ML SOPN Inject 110 Units into the skin daily.    . insulin lispro (HUMALOG) 100 UNIT/ML injection Inject 20 Units into the skin 3 (three) times daily.     [name] LORazepam (ATIVAN) 1 MG tablet Take 1 mg by mouth 2 (two) times daily as needed for anxiety.     . metoprolol tartrate (LOPRESSOR) 25 MG tablet Take 1 tablet (25 mg total) by mouth 2 (two) times daily. 180 tablet 1  . MOVANTIK 12.5 MG TABS tablet  Take 12.5 mg by mouth daily.  5  . nitroGLYCERIN (NITROSTAT) 0.4 MG SL tablet Place 0.4 mg under the tongue every 5 (five) minutes as needed. For chest pain from esophageal spasms    . oxyCODONE (OXY IR/ROXICODONE) 5 MG immediate release tablet Take 5 mg by mouth every 6 (six) hours as needed (for breakthrough pain).    [name] oxyCODONE (OXYCONTIN) 40 MG 12 hr tablet Take 40 mg by mouth every 8 (eight) hours.     . pregabalin (LYRICA) 150 MG capsule Take 150-450 mg by mouth. 1 in the am and 3 in the pm    . ranitidine (ZANTAC) 150 MG tablet Take 1 tablet (150 mg total) by mouth at bedtime. 90 tablet 3  . DULoxetine (CYMBALTA) 60 MG capsule Take 60 mg by mouth 2 (two) times daily.    . Na Sulfate-K Sulfate-Mg Sulf (SUPREP BOWEL PREP KIT) 17.5-3.13-1.6 GM/180ML SOLN 1 suprep kit for colonoscopy     2 bottles+1 Kit (Patient not taking: Reported on 08/23/2017) 1 Bottle 0  . naloxegol oxalate (MOVANTIK) 25 MG TABS tablet Take 1 tablet (25 mg total) by mouth daily. (Patient not taking: Reported on 08/23/2017) 90 tablet 3  . polyethylene glycol (MIRALAX / GLYCOLAX) packet Take 17-34 g by mouth daily. Higher dose if she feels more constipated.     No facility-administered medications prior to visit.      Allergies:   Iohexol; Morphine and related; Penicillins; Demerol; Victoza [liraglutide]; Dabigatran etexilate mesylate; and Latex   Social History   Socioeconomic History  . Marital status: Married    Spouse name: Not on file  . Number of children: Not on file  . Years of education: Not on file  . Highest education level: Not on file  Occupational History  . Not on file  Social Needs  . Financial resource strain: Not on file  . Food insecurity:    Worry: Not on file    Inability: Not on file  . Transportation needs:    Medical: Not on file    Non-medical: Not on file  Tobacco Use  . Smoking status: Never Smoker  . Smokeless tobacco: Never Used  Substance and Sexual Activity  . Alcohol use: No    . Drug use: No  . Sexual activity: Yes  Lifestyle  . Physical activity:    Days per week: Not on file    Minutes per session: Not on file  . Stress: Not on file  Relationships  . Social connections:    Talks on phone: Not on file    Gets together: Not on file    Attends religious service: Not on file    Active member  of club or organization: Not on file    Attends meetings of clubs or organizations: Not on file    Relationship status: Not on file  Other Topics Concern  . Not on file  Social History Narrative  . Not on file     Family History:  The patient's family history includes Atrial fibrillation in her father; Breast cancer in her maternal aunt; Bronchitis in her father; Diabetes type II in her father; Heart disease in her father; Hypertension in her mother; Hyperthyroidism in her mother; Kidney cancer in her sister.   ROS:   Please see the history of present illness.    She is having some snoring, occasional skipped heartbeats, dizziness. All other systems reviewed and are negative.   PHYSICAL EXAM:   VS:  BP 90/62   Pulse 68   Ht 5' 5" (1.651 m)   Wt 218 lb 6.4 oz (99.1 kg)   BMI 36.34 kg/m    GEN: Well nourished, well developed, in no acute distress  HEENT: normal  Neck: no JVD, carotid bruits, or masses Cardiac: RRR; no murmurs, rubs, or gallops,no edema  Respiratory:  clear to auscultation bilaterally, normal work of breathing GI: soft, nontender, nondistended, + BS MS: no deformity or atrophy  Skin: warm and dry, no rash Neuro:  Alert and Oriented x 3, Strength and sensation are intact Psych: euthymic mood, full affect  Wt Readings from Last 3 Encounters:  08/23/17 218 lb 6.4 oz (99.1 kg)  01/31/17 220 lb 6.4 oz (100 kg)  12/05/16 227 lb 8 oz (103.2 kg)      Studies/Labs Reviewed:   EKG:  EKG not performed  Recent Labs: 01/31/2017: TSH 3.100   Lipid Panel No results found for: CHOL, TRIG, HDL, CHOLHDL, VLDL, LDLCALC, LDLDIRECT  Additional  studies/ records that were reviewed today include:  No new data    ASSESSMENT:    1. On amiodarone therapy   2. Paroxysmal atrial fibrillation (HCC)   3. Anticoagulation goal of INR 2 to 3   4. Obstructive sleep apnea   5. Essential hypertension   6. Combined hyperlipidemia      PLAN:  In order of problems listed above:  1. TSH and C met today.  Repeat TSH and liver panel in 6 months. 2. Good rhythm control on amiodarone 3. Continue rivaroxaban at current dose unless kidney function has changed 4. Continue therapy for obstructive sleep apnea 5. No clinical  Clinical follow-up in 6 months.  PA and lateral chest x-ray, TSH, and liver panel on return.  No change in medical therapy.    Medication Adjustments/Labs and Tests Ordered: Current medicines are reviewed at length with the patient today.  Concerns regarding medicines are outlined above.  Medication changes, Labs and Tests ordered today are listed in the Patient Instructions below. There are no Patient Instructions on file for this visit.   Signed, Sinclair Grooms, MD  08/23/2017 1:46 PM    [facility] [place], [place], [place]  [address] Phone: [phone]; Fax: [phone]

## 2017-08-24 ENCOUNTER — Telehealth: Payer: Self-pay | Admitting: *Deleted

## 2017-08-24 DIAGNOSIS — R7989 Other specified abnormal findings of blood chemistry: Secondary | ICD-10-CM

## 2017-08-24 DIAGNOSIS — Z79899 Other long term (current) drug therapy: Secondary | ICD-10-CM

## 2017-08-24 LAB — BASIC METABOLIC PANEL
BUN/Creatinine Ratio: 10 — ABNORMAL LOW (ref 12–28)
BUN: 11 mg/dL (ref 8–27)
CO2: 27 mmol/L (ref 20–29)
Calcium: 9.1 mg/dL (ref 8.7–10.3)
Chloride: 101 mmol/L (ref 96–106)
Creatinine, Ser: 1.1 mg/dL — ABNORMAL HIGH (ref 0.57–1.00)
GFR calc Af Amer: 59 mL/min/{1.73_m2} — ABNORMAL LOW (ref >59)
GFR calc non Af Amer: 51 mL/min/{1.73_m2} — ABNORMAL LOW (ref >59)
Glucose: 142 mg/dL — ABNORMAL HIGH (ref 65–99)
Potassium: 4.3 mmol/L (ref 3.5–5.2)
Sodium: 145 mmol/L — ABNORMAL HIGH (ref 134–144)

## 2017-08-24 LAB — HEPATIC FUNCTION PANEL
ALT: 25 IU/L (ref 0–32)
AST: 21 IU/L (ref 0–40)
Albumin: 3.7 g/dL (ref 3.6–4.8)
Alkaline Phosphatase: 103 IU/L (ref 39–117)
Bilirubin Total: 0.4 mg/dL (ref 0.0–1.2)
Bilirubin, Direct: 0.14 mg/dL (ref 0.00–0.40)
Total Protein: 6.1 g/dL (ref 6.0–8.5)

## 2017-08-24 LAB — TSH: TSH: 4.78 u[IU]/mL — ABNORMAL HIGH (ref 0.450–4.500)

## 2017-08-24 NOTE — Telephone Encounter (Signed)
Informed pt of results.  Pt agreeable to come 6/4 for repeat labs.  Pt appreciative for call.

## 2017-08-24 NOTE — Telephone Encounter (Signed)
-----   Message from Belva Crome, MD sent at 08/24/2017 11:29 AM EDT ----- Let the patient know TSH mildly elevated. Need to check a repeat TSH in 1 month A copy will be sent to Lajean Manes, MD

## 2017-09-19 ENCOUNTER — Other Ambulatory Visit: Payer: Self-pay | Admitting: Gastroenterology

## 2017-09-25 ENCOUNTER — Other Ambulatory Visit: Payer: Medicare Other

## 2017-10-02 ENCOUNTER — Other Ambulatory Visit: Payer: Medicare Other

## 2017-10-04 DIAGNOSIS — E559 Vitamin D deficiency, unspecified: Secondary | ICD-10-CM | POA: Diagnosis not present

## 2017-10-04 DIAGNOSIS — G933 Postviral fatigue syndrome: Secondary | ICD-10-CM | POA: Diagnosis not present

## 2017-10-04 DIAGNOSIS — Z8639 Personal history of other endocrine, nutritional and metabolic disease: Secondary | ICD-10-CM | POA: Diagnosis not present

## 2017-10-04 DIAGNOSIS — Z79899 Other long term (current) drug therapy: Secondary | ICD-10-CM | POA: Diagnosis not present

## 2017-10-04 DIAGNOSIS — K580 Irritable bowel syndrome with diarrhea: Secondary | ICD-10-CM | POA: Diagnosis not present

## 2017-10-04 DIAGNOSIS — E789 Disorder of lipoprotein metabolism, unspecified: Secondary | ICD-10-CM | POA: Diagnosis not present

## 2017-10-04 DIAGNOSIS — M797 Fibromyalgia: Secondary | ICD-10-CM | POA: Diagnosis not present

## 2017-10-04 DIAGNOSIS — Z8679 Personal history of other diseases of the circulatory system: Secondary | ICD-10-CM | POA: Diagnosis not present

## 2017-10-08 ENCOUNTER — Other Ambulatory Visit: Payer: Medicare Other | Admitting: *Deleted

## 2017-10-08 DIAGNOSIS — K047 Periapical abscess without sinus: Secondary | ICD-10-CM | POA: Diagnosis not present

## 2017-10-08 DIAGNOSIS — J069 Acute upper respiratory infection, unspecified: Secondary | ICD-10-CM | POA: Diagnosis not present

## 2017-10-08 DIAGNOSIS — Z79899 Other long term (current) drug therapy: Secondary | ICD-10-CM | POA: Diagnosis not present

## 2017-10-08 DIAGNOSIS — R232 Flushing: Secondary | ICD-10-CM | POA: Diagnosis not present

## 2017-10-08 DIAGNOSIS — R7989 Other specified abnormal findings of blood chemistry: Secondary | ICD-10-CM | POA: Diagnosis not present

## 2017-10-08 DIAGNOSIS — B9789 Other viral agents as the cause of diseases classified elsewhere: Secondary | ICD-10-CM | POA: Diagnosis not present

## 2017-10-09 LAB — TSH: TSH: 3.69 u[IU]/mL (ref 0.450–4.500)

## 2017-11-09 DIAGNOSIS — H66006 Acute suppurative otitis media without spontaneous rupture of ear drum, recurrent, bilateral: Secondary | ICD-10-CM | POA: Diagnosis not present

## 2017-11-09 DIAGNOSIS — J029 Acute pharyngitis, unspecified: Secondary | ICD-10-CM | POA: Diagnosis not present

## 2017-12-05 ENCOUNTER — Other Ambulatory Visit: Payer: Self-pay | Admitting: Gastroenterology

## 2017-12-14 DIAGNOSIS — F331 Major depressive disorder, recurrent, moderate: Secondary | ICD-10-CM | POA: Diagnosis not present

## 2017-12-17 ENCOUNTER — Other Ambulatory Visit: Payer: Self-pay | Admitting: Interventional Cardiology

## 2017-12-17 MED ORDER — METOPROLOL TARTRATE 25 MG PO TABS: 25.0000 mg | ORAL_TABLET | Freq: Two times a day (BID) | ORAL | 2 refills | Status: DC

## 2018-01-04 DIAGNOSIS — Z794 Long term (current) use of insulin: Secondary | ICD-10-CM | POA: Diagnosis not present

## 2018-01-04 DIAGNOSIS — M25511 Pain in right shoulder: Secondary | ICD-10-CM | POA: Diagnosis not present

## 2018-01-04 DIAGNOSIS — Z23 Encounter for immunization: Secondary | ICD-10-CM | POA: Diagnosis not present

## 2018-01-04 DIAGNOSIS — N183 Chronic kidney disease, stage 3 (moderate): Secondary | ICD-10-CM | POA: Diagnosis not present

## 2018-01-04 DIAGNOSIS — E1142 Type 2 diabetes mellitus with diabetic polyneuropathy: Secondary | ICD-10-CM | POA: Diagnosis not present

## 2018-01-09 ENCOUNTER — Other Ambulatory Visit: Payer: Self-pay | Admitting: Geriatric Medicine

## 2018-01-09 DIAGNOSIS — E1165 Type 2 diabetes mellitus with hyperglycemia: Secondary | ICD-10-CM | POA: Diagnosis not present

## 2018-01-09 DIAGNOSIS — Z1231 Encounter for screening mammogram for malignant neoplasm of breast: Secondary | ICD-10-CM

## 2018-01-22 DIAGNOSIS — F331 Major depressive disorder, recurrent, moderate: Secondary | ICD-10-CM | POA: Diagnosis not present

## 2018-01-22 DIAGNOSIS — F4541 Pain disorder exclusively related to psychological factors: Secondary | ICD-10-CM | POA: Diagnosis not present

## 2018-01-30 DIAGNOSIS — M13842 Other specified arthritis, left hand: Secondary | ICD-10-CM | POA: Diagnosis not present

## 2018-01-30 DIAGNOSIS — M7541 Impingement syndrome of right shoulder: Secondary | ICD-10-CM | POA: Diagnosis not present

## 2018-01-30 DIAGNOSIS — G5601 Carpal tunnel syndrome, right upper limb: Secondary | ICD-10-CM | POA: Diagnosis not present

## 2018-02-20 ENCOUNTER — Ambulatory Visit: Payer: Medicare Other

## 2018-03-05 DIAGNOSIS — K047 Periapical abscess without sinus: Secondary | ICD-10-CM | POA: Diagnosis not present

## 2018-03-07 ENCOUNTER — Ambulatory Visit
Admission: RE | Admit: 2018-03-07 | Discharge: 2018-03-07 | Disposition: A | Discharge status: Home / Self Care | Payer: Medicare Other | Source: Ambulatory Visit | Attending: Geriatric Medicine | Admitting: Geriatric Medicine

## 2018-03-07 DIAGNOSIS — Z1231 Encounter for screening mammogram for malignant neoplasm of breast: Secondary | ICD-10-CM | POA: Diagnosis not present

## 2018-03-14 ENCOUNTER — Ambulatory Visit (INDEPENDENT_AMBULATORY_CARE_PROVIDER_SITE_OTHER): Payer: Medicare Other | Admitting: Interventional Cardiology

## 2018-03-14 ENCOUNTER — Encounter: Payer: Self-pay | Admitting: Interventional Cardiology

## 2018-03-14 VITALS — BP 112/68 | HR 69 | Ht 65.0 in | Wt 228.8 lb

## 2018-03-14 DIAGNOSIS — Z79899 Other long term (current) drug therapy: Secondary | ICD-10-CM

## 2018-03-14 DIAGNOSIS — I480 Paroxysmal atrial fibrillation: Secondary | ICD-10-CM | POA: Diagnosis not present

## 2018-03-14 DIAGNOSIS — G4733 Obstructive sleep apnea (adult) (pediatric): Secondary | ICD-10-CM

## 2018-03-14 DIAGNOSIS — Z5181 Encounter for therapeutic drug level monitoring: Secondary | ICD-10-CM | POA: Diagnosis not present

## 2018-03-14 DIAGNOSIS — F329 Major depressive disorder, single episode, unspecified: Secondary | ICD-10-CM

## 2018-03-14 DIAGNOSIS — I10 Essential (primary) hypertension: Secondary | ICD-10-CM

## 2018-03-14 DIAGNOSIS — Z7901 Long term (current) use of anticoagulants: Secondary | ICD-10-CM | POA: Diagnosis not present

## 2018-03-14 NOTE — Progress Notes (Signed)
Cardiology Office Note:    Date:  03/15/2018   ID:  JLYNN LY, DOB 08-06-1945, MRN [id number]  PCP:  Lajean Manes, MD  Cardiologist:  Sinclair Grooms, MD   Referring MD: Lajean Manes, MD   Chief Complaint  Patient presents with  . Atrial Fibrillation    History of Present Illness:    [name] is a 72 y.o. female with a hx of PAF on amio and Eliquis, DM, MVP, HLD, fibromyalgia, GERD and OSA.Marland KitchenCHADS VASC 3.   [name] is having difficulty with depression.  She has had a lot over the last 2 years including the need to downsize, get rid of stuff in her home, family issues, and fibromyalgia which has stolen her quality of life.  No bleeding on anticoagulation.  No episodes of atrial fibrillation.  On amiodarone therapy without obvious side effects.  No symptoms to suggest coronary disease or angina.  Past Medical History:  Diagnosis Date  . Cataract   . Complication of anesthesia 1975   projectile vomitting  . Diabetes mellitus   . Dyslipidemia   . Dysrhythmia    hx. A.Fib-Cardioversion-converted rhythm  . Fibromyalgia   . GERD (gastroesophageal reflux disease)   . Hypertension   . MVP (mitral valve prolapse)   . OSA (obstructive sleep apnea) 10-18-12   no cpap used now.  [name] PONV (postoperative nausea and vomiting)   . Vitamin D deficiency 10-18-12    Past Surgical History:  Procedure Laterality Date  . ABDOMINAL HYSTERECTOMY     '95  . BALLOON DILATION N/A 12/03/2012   Procedure: BALLOON DILATION;  Surgeon: [name], MD;  Location: [facility];  Service: Endoscopy;  Laterality: N/A;  . BREAST EXCISIONAL BIOPSY Left   . CARDIOVERSION  07/28/2011   Procedure: CARDIOVERSION;  Surgeon: Sinclair Grooms, MD;  Location: [facility];  Service: Cardiovascular;  Laterality: N/A;  . CATARACT EXTRACTION Left 10-18-12   10'13  . COLONOSCOPY WITH PROPOFOL N/A 03/29/2015   Procedure: COLONOSCOPY WITH PROPOFOL;  Surgeon: [name], MD;  Location: [facility];  Service: Endoscopy;  Laterality: N/A;  . ESOPHAGEAL MANOMETRY N/A 05/19/2013   Procedure: ESOPHAGEAL MANOMETRY (EM);  Surgeon: [name], MD;  Location: [facility];  Service: Endoscopy;  Laterality: N/A;  . ESOPHAGOGASTRODUODENOSCOPY (EGD) WITH PROPOFOL N/A 12/03/2012   Procedure: ESOPHAGOGASTRODUODENOSCOPY (EGD) WITH Dilation & w/PROPOFOL;  Surgeon: [name], MD;  Location: [facility];  Service: Endoscopy;  Laterality: N/A;  . RETINAL DETACHMENT SURGERY Bilateral 11/2016  . THYROIDECTOMY  1981  . TUBAL LIGATION      Current Medications: Current Meds  Medication Sig  . acetaminophen (TYLENOL) 500 MG tablet Take 500 mg by mouth every 6 (six) hours as needed for mild pain or fever.   [name] amiodarone (PACERONE) 200 MG tablet TAKE 1/2 TABLET BY MOUTH DAILY  . amphetamine-dextroamphetamine (ADDERALL) 20 MG tablet Take 20 mg by mouth 3 (three) times daily.   [name] atorvastatin (LIPITOR) 80 MG tablet Take 80 mg by mouth at bedtime.   . BD PEN NEEDLE NANO U/F 32G X 4 MM MISC   . buPROPion (WELLBUTRIN SR) 150 MG 12 hr tablet Take 150 mg by mouth 2 (two) times daily.  . Cholecalciferol (VITAMIN D) 2000 UNITS tablet Take 2,000 Units by mouth daily.  [name] ELIQUIS 5 MG TABS tablet Take 1 tablet (5 mg total) by mouth 2 (two) times daily.  [name] esomeprazole (NEXIUM) 40 MG capsule TAKE 1 CAPSULE TWICE DAILY  BEFORE MEALS  . furosemide (LASIX) 20 MG tablet Take 0.5 tablets (10 mg total) by mouth daily as needed for fluid.  . Insulin Glargine (BASAGLAR KWIKPEN) 100 UNIT/ML SOPN Inject 110 Units into the skin daily.  . insulin lispro (HUMALOG) 100 UNIT/ML injection Inject 20 Units into the skin 3 (three) times daily.   . metoprolol tartrate (LOPRESSOR) 25 MG tablet Take 1 tablet (25 mg total) by mouth 2 (two) times daily.  [name] MOVANTIK 12.5 MG TABS tablet Take 12.5 mg by mouth daily.  . nitroGLYCERIN (NITROSTAT) 0.4 MG SL tablet Place 0.4 mg under the tongue every 5 (five) minutes as needed. For  chest pain from esophageal spasms  . oxyCODONE (OXY IR/ROXICODONE) 5 MG immediate release tablet Take 5 mg by mouth every 6 (six) hours as needed (for breakthrough pain).  [name] oxyCODONE (OXYCONTIN) 40 MG 12 hr tablet Take 40 mg by mouth every 8 (eight) hours.   . pregabalin (LYRICA) 150 MG capsule Take 150-450 mg by mouth. 1 in the am and 3 in the pm  . ranitidine (ZANTAC) 150 MG tablet TAKE 1 TABLET AT BEDTIME  . sertraline (ZOLOFT) 50 MG tablet Take 50 mg by mouth daily.     Allergies:   Iohexol; Morphine and related; Penicillins; Demerol; Victoza [liraglutide]; Dabigatran etexilate mesylate; and Latex   Social History   Socioeconomic History  . Marital status: Married    Spouse name: Not on file  . Number of children: Not on file  . Years of education: Not on file  . Highest education level: Not on file  Occupational History  . Not on file  Social Needs  . Financial resource strain: Not on file  . Food insecurity:    Worry: Not on file    Inability: Not on file  . Transportation needs:    Medical: Not on file    Non-medical: Not on file  Tobacco Use  . Smoking status: Never Smoker  . Smokeless tobacco: Never Used  Substance and Sexual Activity  . Alcohol use: No  . Drug use: No  . Sexual activity: Yes  Lifestyle  . Physical activity:    Days per week: Not on file    Minutes per session: Not on file  . Stress: Not on file  Relationships  . Social connections:    Talks on phone: Not on file    Gets together: Not on file    Attends religious service: Not on file    Active member of club or organization: Not on file    Attends meetings of clubs or organizations: Not on file    Relationship status: Not on file  Other Topics Concern  . Not on file  Social History Narrative  . Not on file     Family History: The patient's family history includes Atrial fibrillation in her father; Breast cancer in her maternal aunt; Bronchitis in her father; Diabetes type II in her  father; Heart disease in her father; Hypertension in her mother; Hyperthyroidism in her mother; Kidney cancer in her sister.  ROS:   Please see the history of present illness.    Dental problems.  4 teeth are to be pulled tomorrow.  Denies other issues other than the depression mentioned above.  All other systems reviewed and are negative.  EKGs/Labs/Other Studies Reviewed:    The following studies were reviewed today: No new data  EKG:  EKG is  ordered today.  The ekg ordered today demonstrates normal sinus rhythm with  normal overall appearance.  Recent Labs: 08/23/2017: ALT 25; BUN 11; Creatinine, Ser 1.10; Potassium 4.3; Sodium 145 10/08/2017: TSH 3.690  Recent Lipid Panel No results found for: CHOL, TRIG, HDL, CHOLHDL, VLDL, LDLCALC, LDLDIRECT  Physical Exam:    VS:  BP 112/68   Pulse 69   Ht 5\' 5"  (1.651 m)   Wt 228 lb 12.8 oz (103.8 kg)   SpO2 97%   BMI 38.07 kg/m     Wt Readings from Last 3 Encounters:  03/14/18 228 lb 12.8 oz (103.8 kg)  08/23/17 218 lb 6.4 oz (99.1 kg)  01/31/17 220 lb 6.4 oz (100 kg)     GEN: Obese well nourished, well developed in no acute distress HEENT: Poor dentition NECK: No JVD. LYMPHATICS: No lymphadenopathy CARDIAC: RRR, no murmur, no gallop, no edema. VASCULAR: 2+ bilateral pulses.  No bruits. RESPIRATORY:  Clear to auscultation without rales, wheezing or rhonchi  ABDOMEN: Soft, non-tender, non-distended, No pulsatile mass, MUSCULOSKELETAL: No deformity  SKIN: Warm and dry NEUROLOGIC:  Alert and oriented x 3 PSYCHIATRIC:  Normal affect   ASSESSMENT:    1. On amiodarone therapy   2. Essential hypertension   3. Paroxysmal atrial fibrillation (HCC)   4. Obstructive sleep apnea   5. Anticoagulation goal of INR 2 to 3   6. Reactive depression    PLAN:    In order of problems listed above:  1. Most recent TSH was 5 months ago.  Same for liver panel.  She will see primary physician Dr. Lavone Orn in 2 weeks.  She anticipates  blood work to be done at that time.  We will plan to see her back in 6 months at which time a TSH liver panel and chest x-ray will be performed. 2. Goal blood pressure less than 130/80 mmHg.  She is well within that range.  We did discuss aerobic activity and salt restriction to less than 2 g/day. 3. Continue amiodarone therapy to maintain sinus rhythm.  She is only on 100 mg/day.  Amiodarone therapy will be monitored as noted above. 4. We discussed compliance with CPAP. 5. I asked the patient to hold this evening's and tomorrow morning's dose of Eliquis in preparation for dental procedure.  She needs to be certain to inform the dentist that she has been off the medicine for only 24 hours. 6. QT interval on EKG is not prolonged.  Currently stable on amiodarone (low-dose) and current psychotropic regimen.  No change in amiodarone therapy as needed.  Plan clinical follow-up in 6 months.  TSH, chest x-ray, and liver panel at that time.  Hold apixaban tonight and in the a.m. in preparation for dental extractions tomorrow.  Notify surgeon that medication has been on hold.  Greater than 50% of the time during this office visit was spent in education, counseling, and coordination of care related to underlying disease process and testing as outlined.    Medication Adjustments/Labs and Tests Ordered: Current medicines are reviewed at length with the patient today.  Concerns regarding medicines are outlined above.  Orders Placed This Encounter  Procedures  . EKG 12-Lead   No orders of the defined types were placed in this encounter.   Patient Instructions  Medication Instructions:  Your physician recommends that you continue on your current medications as directed. Please refer to the Current Medication list given to you today. If you need a refill on your cardiac medications before your next appointment, please call your pharmacy.   Lab work: None  If you have labs (blood work) drawn today and  your tests are completely normal, you will receive your results only by: [name] MyChart Message (if you have MyChart) OR . A paper copy in the mail If you have any lab test that is abnormal or we need to change your treatment, we will call you to review the results.  Testing/Procedures: None  Follow-Up: At [facility], you and your health needs are our priority.  As part of our continuing mission to provide you with exceptional heart care, we have created designated Provider Care Teams.  These Care Teams include your primary Cardiologist (physician) and Advanced Practice Providers (APPs -  Physician Assistants and Nurse Practitioners) who all work together to provide you with the care you need, when you need it. You will need a follow up appointment in 6 months.  Please call our office 2 months in advance to schedule this appointment.  You may see Sinclair Grooms, MD or one of the following Advanced Practice Providers on your designated Care Team:   Truitt Merle, NP Cecilie Kicks, NP . Kathyrn Drown, NP  Any Other Special Instructions Will Be Listed Below (If Applicable).       Signed, Sinclair Grooms, MD  03/15/2018 12:44 PM    [facility]

## 2018-03-14 NOTE — Patient Instructions (Signed)

## 2018-03-15 ENCOUNTER — Encounter: Payer: Self-pay | Admitting: Interventional Cardiology

## 2018-03-25 ENCOUNTER — Other Ambulatory Visit: Payer: Self-pay | Admitting: Interventional Cardiology

## 2018-03-25 MED ORDER — FUROSEMIDE 20 MG PO TABS: 10.0000 mg | ORAL_TABLET | Freq: Every day | ORAL | 3 refills | Status: DC | PRN

## 2018-04-03 ENCOUNTER — Telehealth: Payer: Self-pay | Admitting: Gastroenterology

## 2018-04-03 MED ORDER — FAMOTIDINE 20 MG PO TABS: 20.0000 mg | ORAL_TABLET | Freq: Every day | ORAL | 3 refills | Status: DC

## 2018-04-03 NOTE — Telephone Encounter (Signed)
Called patient and informed Pepcid 20 mg has been sent to her mail order pharmacy as a 90 day supply

## 2018-04-05 ENCOUNTER — Other Ambulatory Visit: Payer: Self-pay | Admitting: Geriatric Medicine

## 2018-04-05 DIAGNOSIS — M25562 Pain in left knee: Secondary | ICD-10-CM | POA: Diagnosis not present

## 2018-04-08 ENCOUNTER — Ambulatory Visit: Payer: Medicare Other | Admitting: Gastroenterology

## 2018-04-09 ENCOUNTER — Other Ambulatory Visit: Payer: Self-pay | Admitting: Interventional Cardiology

## 2018-04-09 ENCOUNTER — Ambulatory Visit
Admission: RE | Admit: 2018-04-09 | Discharge: 2018-04-09 | Disposition: A | Discharge status: Home / Self Care | Payer: Medicare Other | Source: Ambulatory Visit | Attending: Geriatric Medicine | Admitting: Geriatric Medicine

## 2018-04-09 DIAGNOSIS — M25562 Pain in left knee: Secondary | ICD-10-CM

## 2018-04-09 DIAGNOSIS — M25462 Effusion, left knee: Secondary | ICD-10-CM | POA: Diagnosis not present

## 2018-04-10 ENCOUNTER — Other Ambulatory Visit: Payer: Self-pay | Admitting: Interventional Cardiology

## 2018-04-10 ENCOUNTER — Telehealth: Payer: Self-pay | Admitting: Interventional Cardiology

## 2018-04-10 DIAGNOSIS — I480 Paroxysmal atrial fibrillation: Secondary | ICD-10-CM

## 2018-04-10 MED ORDER — AMIODARONE HCL 200 MG PO TABS: 100.0000 mg | ORAL_TABLET | Freq: Every day | ORAL | 3 refills | Status: DC

## 2018-04-10 NOTE — Telephone Encounter (Signed)
New message       *STAT* If patient is at the pharmacy, call can be transferred to refill team.   1. Which medications need to be refilled? (please list name of each medication and dose if known)  eliquis 5mg   2. Which pharmacy/location (including street and city if local pharmacy) is medication to be sent to? [facility]  3. Do they need a 30 day or 90 day supply? Pt request 180 pills

## 2018-04-10 NOTE — Telephone Encounter (Signed)
Pt . Is a 72 yr old female who saw Dr. Tamala Julian on 03/14/18, weight at that visit was 103.8Kg. SCr on 08/23/17 was 1.1, will refill Elquis 5mg  BID.

## 2018-04-11 MED ORDER — ELIQUIS 5 MG PO TABS: 5.0000 mg | ORAL_TABLET | Freq: Two times a day (BID) | ORAL | 1 refills | Status: DC

## 2018-04-11 NOTE — Telephone Encounter (Signed)
Refill sent in

## 2018-04-15 DIAGNOSIS — M25562 Pain in left knee: Secondary | ICD-10-CM | POA: Diagnosis not present

## 2018-04-15 DIAGNOSIS — M1712 Unilateral primary osteoarthritis, left knee: Secondary | ICD-10-CM | POA: Diagnosis not present

## 2018-04-15 DIAGNOSIS — M25561 Pain in right knee: Secondary | ICD-10-CM | POA: Diagnosis not present

## 2018-05-06 DIAGNOSIS — M25562 Pain in left knee: Secondary | ICD-10-CM | POA: Diagnosis not present

## 2018-05-06 DIAGNOSIS — M1712 Unilateral primary osteoarthritis, left knee: Secondary | ICD-10-CM | POA: Diagnosis not present

## 2018-05-07 DIAGNOSIS — F419 Anxiety disorder, unspecified: Secondary | ICD-10-CM | POA: Diagnosis not present

## 2018-05-07 DIAGNOSIS — R42 Dizziness and giddiness: Secondary | ICD-10-CM | POA: Diagnosis not present

## 2018-05-07 DIAGNOSIS — N179 Acute kidney failure, unspecified: Secondary | ICD-10-CM | POA: Diagnosis not present

## 2018-05-07 DIAGNOSIS — I129 Hypertensive chronic kidney disease with stage 1 through stage 4 chronic kidney disease, or unspecified chronic kidney disease: Secondary | ICD-10-CM | POA: Diagnosis not present

## 2018-05-07 DIAGNOSIS — Z79899 Other long term (current) drug therapy: Secondary | ICD-10-CM | POA: Diagnosis not present

## 2018-05-07 DIAGNOSIS — Z823 Family history of stroke: Secondary | ICD-10-CM | POA: Diagnosis not present

## 2018-05-07 DIAGNOSIS — G4733 Obstructive sleep apnea (adult) (pediatric): Secondary | ICD-10-CM | POA: Diagnosis not present

## 2018-05-07 DIAGNOSIS — R05 Cough: Secondary | ICD-10-CM | POA: Diagnosis not present

## 2018-05-07 DIAGNOSIS — E1122 Type 2 diabetes mellitus with diabetic chronic kidney disease: Secondary | ICD-10-CM | POA: Diagnosis not present

## 2018-05-07 DIAGNOSIS — Z794 Long term (current) use of insulin: Secondary | ICD-10-CM | POA: Diagnosis not present

## 2018-05-07 DIAGNOSIS — Z7901 Long term (current) use of anticoagulants: Secondary | ICD-10-CM | POA: Diagnosis not present

## 2018-05-07 DIAGNOSIS — R531 Weakness: Secondary | ICD-10-CM | POA: Diagnosis not present

## 2018-05-07 DIAGNOSIS — I4892 Unspecified atrial flutter: Secondary | ICD-10-CM | POA: Diagnosis not present

## 2018-05-07 DIAGNOSIS — M797 Fibromyalgia: Secondary | ICD-10-CM | POA: Diagnosis not present

## 2018-05-07 DIAGNOSIS — N183 Chronic kidney disease, stage 3 (moderate): Secondary | ICD-10-CM | POA: Diagnosis not present

## 2018-05-07 DIAGNOSIS — Z91040 Latex allergy status: Secondary | ICD-10-CM | POA: Diagnosis not present

## 2018-05-07 DIAGNOSIS — Z880 Allergy status to penicillin: Secondary | ICD-10-CM | POA: Diagnosis not present

## 2018-05-07 DIAGNOSIS — F900 Attention-deficit hyperactivity disorder, predominantly inattentive type: Secondary | ICD-10-CM | POA: Diagnosis not present

## 2018-05-07 DIAGNOSIS — Z6838 Body mass index (BMI) 38.0-38.9, adult: Secondary | ICD-10-CM | POA: Diagnosis not present

## 2018-05-07 DIAGNOSIS — R251 Tremor, unspecified: Secondary | ICD-10-CM | POA: Diagnosis not present

## 2018-05-07 DIAGNOSIS — S0990XA Unspecified injury of head, initial encounter: Secondary | ICD-10-CM | POA: Diagnosis not present

## 2018-05-07 DIAGNOSIS — Z8673 Personal history of transient ischemic attack (TIA), and cerebral infarction without residual deficits: Secondary | ICD-10-CM | POA: Diagnosis not present

## 2018-05-07 DIAGNOSIS — Z885 Allergy status to narcotic agent status: Secondary | ICD-10-CM | POA: Diagnosis not present

## 2018-05-07 DIAGNOSIS — Z87891 Personal history of nicotine dependence: Secondary | ICD-10-CM | POA: Diagnosis not present

## 2018-05-07 DIAGNOSIS — E785 Hyperlipidemia, unspecified: Secondary | ICD-10-CM | POA: Diagnosis not present

## 2018-05-07 DIAGNOSIS — I4891 Unspecified atrial fibrillation: Secondary | ICD-10-CM | POA: Diagnosis not present

## 2018-05-14 DIAGNOSIS — Z79899 Other long term (current) drug therapy: Secondary | ICD-10-CM | POA: Diagnosis not present

## 2018-05-14 DIAGNOSIS — E1121 Type 2 diabetes mellitus with diabetic nephropathy: Secondary | ICD-10-CM | POA: Diagnosis not present

## 2018-05-14 DIAGNOSIS — Z794 Long term (current) use of insulin: Secondary | ICD-10-CM | POA: Diagnosis not present

## 2018-05-14 DIAGNOSIS — Z6838 Body mass index (BMI) 38.0-38.9, adult: Secondary | ICD-10-CM | POA: Diagnosis not present

## 2018-05-14 DIAGNOSIS — N183 Chronic kidney disease, stage 3 (moderate): Secondary | ICD-10-CM | POA: Diagnosis not present

## 2018-05-14 DIAGNOSIS — E1142 Type 2 diabetes mellitus with diabetic polyneuropathy: Secondary | ICD-10-CM | POA: Diagnosis not present

## 2018-05-14 DIAGNOSIS — R5382 Chronic fatigue, unspecified: Secondary | ICD-10-CM | POA: Diagnosis not present

## 2018-05-16 DIAGNOSIS — F411 Generalized anxiety disorder: Secondary | ICD-10-CM | POA: Diagnosis not present

## 2018-05-16 DIAGNOSIS — F4541 Pain disorder exclusively related to psychological factors: Secondary | ICD-10-CM | POA: Diagnosis not present

## 2018-05-16 DIAGNOSIS — F331 Major depressive disorder, recurrent, moderate: Secondary | ICD-10-CM | POA: Diagnosis not present

## 2018-05-22 DIAGNOSIS — M1712 Unilateral primary osteoarthritis, left knee: Secondary | ICD-10-CM | POA: Diagnosis not present

## 2018-06-04 DIAGNOSIS — M1712 Unilateral primary osteoarthritis, left knee: Secondary | ICD-10-CM | POA: Diagnosis not present

## 2018-06-05 DIAGNOSIS — Z6841 Body Mass Index (BMI) 40.0 and over, adult: Secondary | ICD-10-CM | POA: Diagnosis not present

## 2018-06-05 DIAGNOSIS — E7800 Pure hypercholesterolemia, unspecified: Secondary | ICD-10-CM | POA: Diagnosis not present

## 2018-06-05 DIAGNOSIS — Z0000 Encounter for general adult medical examination without abnormal findings: Secondary | ICD-10-CM | POA: Diagnosis not present

## 2018-06-05 DIAGNOSIS — E1165 Type 2 diabetes mellitus with hyperglycemia: Secondary | ICD-10-CM | POA: Diagnosis not present

## 2018-06-05 DIAGNOSIS — E1121 Type 2 diabetes mellitus with diabetic nephropathy: Secondary | ICD-10-CM | POA: Diagnosis not present

## 2018-06-05 DIAGNOSIS — Z1389 Encounter for screening for other disorder: Secondary | ICD-10-CM | POA: Diagnosis not present

## 2018-06-05 DIAGNOSIS — Z79899 Other long term (current) drug therapy: Secondary | ICD-10-CM | POA: Diagnosis not present

## 2018-06-05 DIAGNOSIS — R5382 Chronic fatigue, unspecified: Secondary | ICD-10-CM | POA: Diagnosis not present

## 2018-06-05 DIAGNOSIS — E1142 Type 2 diabetes mellitus with diabetic polyneuropathy: Secondary | ICD-10-CM | POA: Diagnosis not present

## 2018-06-05 DIAGNOSIS — F331 Major depressive disorder, recurrent, moderate: Secondary | ICD-10-CM | POA: Diagnosis not present

## 2018-06-05 DIAGNOSIS — N183 Chronic kidney disease, stage 3 (moderate): Secondary | ICD-10-CM | POA: Diagnosis not present

## 2018-06-06 DIAGNOSIS — M25562 Pain in left knee: Secondary | ICD-10-CM | POA: Diagnosis not present

## 2018-06-06 DIAGNOSIS — M1712 Unilateral primary osteoarthritis, left knee: Secondary | ICD-10-CM | POA: Diagnosis not present

## 2018-06-07 DIAGNOSIS — M1712 Unilateral primary osteoarthritis, left knee: Secondary | ICD-10-CM | POA: Diagnosis not present

## 2018-06-14 DIAGNOSIS — M1712 Unilateral primary osteoarthritis, left knee: Secondary | ICD-10-CM | POA: Diagnosis not present

## 2018-06-17 DIAGNOSIS — M1712 Unilateral primary osteoarthritis, left knee: Secondary | ICD-10-CM | POA: Diagnosis not present

## 2018-06-21 DIAGNOSIS — M1712 Unilateral primary osteoarthritis, left knee: Secondary | ICD-10-CM | POA: Diagnosis not present

## 2018-06-22 DIAGNOSIS — F331 Major depressive disorder, recurrent, moderate: Secondary | ICD-10-CM | POA: Diagnosis not present

## 2018-06-22 DIAGNOSIS — N183 Chronic kidney disease, stage 3 (moderate): Secondary | ICD-10-CM | POA: Diagnosis not present

## 2018-06-22 DIAGNOSIS — Z794 Long term (current) use of insulin: Secondary | ICD-10-CM | POA: Diagnosis not present

## 2018-06-22 DIAGNOSIS — E1165 Type 2 diabetes mellitus with hyperglycemia: Secondary | ICD-10-CM | POA: Diagnosis not present

## 2018-07-15 ENCOUNTER — Ambulatory Visit: Payer: Medicare Other | Admitting: Physician Assistant

## 2018-07-31 DIAGNOSIS — F331 Major depressive disorder, recurrent, moderate: Secondary | ICD-10-CM | POA: Diagnosis not present

## 2018-09-23 ENCOUNTER — Telehealth: Payer: Self-pay | Admitting: General Surgery

## 2018-09-23 NOTE — Telephone Encounter (Signed)
Called patient at home number and left a voicemail to contact the office to pre-screen.  Called the patient on her cell number  she answered but was unable to speak to me to pre-screen. The patient said she will try to call back.

## 2018-09-24 ENCOUNTER — Encounter: Payer: Self-pay | Admitting: Gastroenterology

## 2018-09-24 ENCOUNTER — Other Ambulatory Visit: Payer: Self-pay

## 2018-09-24 ENCOUNTER — Ambulatory Visit (INDEPENDENT_AMBULATORY_CARE_PROVIDER_SITE_OTHER): Payer: Medicare Other | Admitting: Gastroenterology

## 2018-09-24 VITALS — Ht 65.0 in | Wt 228.8 lb

## 2018-09-24 DIAGNOSIS — Z1211 Encounter for screening for malignant neoplasm of colon: Secondary | ICD-10-CM | POA: Diagnosis not present

## 2018-09-24 DIAGNOSIS — Z1212 Encounter for screening for malignant neoplasm of rectum: Secondary | ICD-10-CM | POA: Diagnosis not present

## 2018-09-24 DIAGNOSIS — K219 Gastro-esophageal reflux disease without esophagitis: Secondary | ICD-10-CM | POA: Diagnosis not present

## 2018-09-24 DIAGNOSIS — R1310 Dysphagia, unspecified: Secondary | ICD-10-CM | POA: Diagnosis not present

## 2018-09-24 NOTE — Patient Instructions (Addendum)
EGD and colonoscopy at [facility] with extended 2 days bowel prep  Hold Eliquis for 2 days prior to the procedure, will need cardiology clearance  Follow-up tele visit after EGD and colonoscopy  I appreciate the  opportunity to care for you  Thank You   Harl Bowie , MD

## 2018-09-24 NOTE — Progress Notes (Signed)
[name]    [id number]    03/13/1946  Primary Care Physician:Stoneking, Christiane Ha, MD  Referring Physician: Lajean Manes, MD [address] [place], [place] [address]  This service was provided via audio telemedicine (Doximity) due to [place] 19 pandemic.  Patient location: Home Provider location: Office Used 2 patient identifiers to confirm the correct person. Explained the limitations in evaluation and management via telemedicine. Patient is aware of potential medical charges for this visit.  Patient consented to this virtual visit.  The persons participating in this telemedicine service were myself and the patient   Chief complaint:  Dysphagia  HPI:  73 year old female with history of chronic GERD with complaints of intermittent dysphagia.  She was last seen in office August 2018 and was recommended to undergo EGD and colonoscopy.  Has been lost to follow-up.  She continues to have intermittent dysphagia, worse with food especially meat and bread.  Also has chronic constipation.  Denies any rectal bleeding, melena or abdominal pain.  No weight loss.  GI history: She had EGD, barium swallow and esophageal manometry.  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  Last colonoscopy in 2016 was incomplete as Dr. Wynetta Emery could not advance the scope beyond splenic flexure  Outpatient Encounter Medications as of 09/24/2018  Medication Sig  . acetaminophen (TYLENOL) 500 MG tablet Take 500 mg by mouth every 6 (six) hours as needed for mild pain or fever.   [name] amiodarone (PACERONE) 200 MG tablet Take 0.5 tablets (100 mg total) by mouth daily.  [name] amphetamine-dextroamphetamine (ADDERALL) 20 MG tablet Take 20 mg by mouth 3 (three) times daily.   [name] atorvastatin (LIPITOR) 80 MG tablet Take 80 mg by mouth at bedtime.   .  BD PEN NEEDLE NANO U/F 32G X 4 MM MISC   . buPROPion (WELLBUTRIN SR) 150 MG 12 hr tablet Take 150 mg by mouth 2 (two) times daily.  . Cholecalciferol (VITAMIN D) 2000 UNITS tablet Take 2,000 Units by mouth daily.  [name] ELIQUIS 5 MG TABS tablet Take 1 tablet (5 mg total) by mouth 2 (two) times daily.  [name] esomeprazole (NEXIUM) 40 MG capsule TAKE 1 CAPSULE TWICE DAILY BEFORE MEALS  . famotidine (PEPCID) 20 MG tablet Take 1 tablet (20 mg total) by mouth at bedtime.  . furosemide (LASIX) 20 MG tablet Take 0.5 tablets (10 mg total) by mouth daily as needed for fluid.  . Insulin Glargine (BASAGLAR KWIKPEN) 100 UNIT/ML SOPN Inject 110 Units into the skin daily.  . insulin lispro (HUMALOG) 100 UNIT/ML injection Inject 20 Units into the skin 3 (three) times daily.   . metoprolol tartrate (LOPRESSOR) 25 MG tablet Take 1 tablet (25 mg total) by mouth 2 (two) times daily.  [name] MOVANTIK 12.5 MG TABS tablet Take 12.5 mg by mouth daily.  . nitroGLYCERIN (NITROSTAT) 0.4 MG SL tablet Place 0.4 mg under the tongue every 5 (five) minutes as needed. For chest pain from esophageal spasms  . oxyCODONE (OXY IR/ROXICODONE) 5 MG immediate release tablet Take 5 mg by mouth every 6 (six) hours as needed (for breakthrough pain).  [name] oxyCODONE (OXYCONTIN) 40 MG 12 hr tablet Take 40 mg by mouth every 8 (eight) hours.   . pregabalin (LYRICA) 150 MG capsule Take 150-450 mg by mouth. 1 in the am and 3 in the pm  .  sertraline (ZOLOFT) 50 MG tablet Take 50 mg by mouth daily.  . [DISCONTINUED] estrogens, conjugated, (PREMARIN) 0.9 MG tablet Take 0.9 mg by mouth daily.    . [DISCONTINUED] glimepiride (AMARYL) 4 MG tablet Take 4 mg by mouth 2 (two) times daily.   . [DISCONTINUED] metFORMIN (GLUCOPHAGE) 500 MG tablet Take 1,000 mg by mouth 2 (two) times daily with a meal.     No facility-administered encounter medications on file as of 09/24/2018.     Allergies as of 09/24/2018 - Review Complete 03/15/2018  Allergen Reaction Noted  .  Iohexol Other (See Comments) 10/20/2005  . Morphine and related Other (See Comments) 10/18/2012  . Penicillins Hives and Swelling   . Demerol Nausea And Vomiting 07/28/2011  . Victoza [liraglutide]  03/26/2015  . Dabigatran etexilate mesylate Other (See Comments) 07/28/2011  . Latex Rash 03/26/2015    Past Medical History:  Diagnosis Date  . Cataract   . Complication of anesthesia 1975   projectile vomitting  . Diabetes mellitus   . Dyslipidemia   . Dysrhythmia    hx. A.Fib-Cardioversion-converted rhythm  . Fibromyalgia   . GERD (gastroesophageal reflux disease)   . Hypertension   . MVP (mitral valve prolapse)   . OSA (obstructive sleep apnea) 10-18-12   no cpap used now.  [name] PONV (postoperative nausea and vomiting)   . Vitamin D deficiency 10-18-12    Past Surgical History:  Procedure Laterality Date  . ABDOMINAL HYSTERECTOMY     '95  . BALLOON DILATION N/A 12/03/2012   Procedure: BALLOON DILATION;  Surgeon: [name], MD;  Location: [facility];  Service: Endoscopy;  Laterality: N/A;  . BREAST EXCISIONAL BIOPSY Left   . CARDIOVERSION  07/28/2011   Procedure: CARDIOVERSION;  Surgeon: Sinclair Grooms, MD;  Location: [facility];  Service: Cardiovascular;  Laterality: N/A;  . CATARACT EXTRACTION Left 10-18-12   10'13  . COLONOSCOPY WITH PROPOFOL N/A 03/29/2015   Procedure: COLONOSCOPY WITH PROPOFOL;  Surgeon: [name], MD;  Location: [facility];  Service: Endoscopy;  Laterality: N/A;  . ESOPHAGEAL MANOMETRY N/A 05/19/2013   Procedure: ESOPHAGEAL MANOMETRY (EM);  Surgeon: [name], MD;  Location: [facility];  Service: Endoscopy;  Laterality: N/A;  . ESOPHAGOGASTRODUODENOSCOPY (EGD) WITH PROPOFOL N/A 12/03/2012   Procedure: ESOPHAGOGASTRODUODENOSCOPY (EGD) WITH Dilation & w/PROPOFOL;  Surgeon: [name], MD;  Location: [facility];  Service: Endoscopy;  Laterality: N/A;  . RETINAL DETACHMENT SURGERY Bilateral 11/2016  . THYROIDECTOMY  1981  . TUBAL  LIGATION      Family History  Problem Relation Age of Onset  . Hypertension Mother   . Hyperthyroidism Mother   . Diabetes type II Father   . Heart disease Father   . Atrial fibrillation Father   . Bronchitis Father   . Kidney cancer Sister   . Breast cancer Maternal Aunt     Social History   Socioeconomic History  . Marital status: Married    Spouse name: Not on file  . Number of children: Not on file  . Years of education: Not on file  . Highest education level: Not on file  Occupational History  . Not on file  Social Needs  . Financial resource strain: Not on file  . Food insecurity:    Worry: Not on file    Inability: Not on file  . Transportation needs:    Medical: Not on file    Non-medical: Not on file  Tobacco Use  . Smoking status: Never  Smoker  . Smokeless tobacco: Never Used  Substance and Sexual Activity  . Alcohol use: No  . Drug use: No  . Sexual activity: Yes  Lifestyle  . Physical activity:    Days per week: Not on file    Minutes per session: Not on file  . Stress: Not on file  Relationships  . Social connections:    Talks on phone: Not on file    Gets together: Not on file    Attends religious service: Not on file    Active member of club or organization: Not on file    Attends meetings of clubs or organizations: Not on file    Relationship status: Not on file  . Intimate partner violence:    Fear of current or ex partner: Not on file    Emotionally abused: Not on file    Physically abused: Not on file    Forced sexual activity: Not on file  Other Topics Concern  . Not on file  Social History Narrative  . Not on file      Review of systems: Review of Systems as per HPI All other systems reviewed and are negative.   Physical Exam: Vitals were not taken and physical exam was not performed during this virtual visit.  Data Reviewed:  Reviewed labs, radiology imaging, old records and pertinent past GI work up   Assessment and  Plan/Recommendations:  73 year old female with history of A. fib on chronic anticoagulation, Eliquis, chronic GERD and dysphagia with esophageal manometry findings suggestive of a GJ outflow obstruction We will schedule for EGD with possible esophageal biopsies and dilation Incomplete colonoscopy in 2016, past due for colorectal cancer screening.  Scheduled for colonoscopy along with EGD. Will discuss with cardiology if okay to hold Eliquis 2 days prior to the procedure The risks and benefits as well as alternatives of endoscopic procedure(s) have been discussed and reviewed. All questions answered. The patient agrees to proceed.      Damaris Hippo , MD   CC: Lajean Manes, MD

## 2018-09-25 ENCOUNTER — Telehealth: Payer: Self-pay | Admitting: *Deleted

## 2018-09-25 NOTE — Telephone Encounter (Signed)
Pt takes Eliquis for afib with CHADS2VASc score of 4 (age, sex, HTN, DM). CrCl 80mL/min. Agree with 2 day Eliquis hold prior to procedure.

## 2018-09-25 NOTE — Telephone Encounter (Signed)
[name] has a virtual appointment with Dr. Tamala Julian tomorrow, 6/4, at which clearance can be addressed. Please have staff route clearance note to requesting provider.   I will also route this to our pharmacy for input on holding anticoagulation.

## 2018-09-25 NOTE — Telephone Encounter (Signed)
Patient needs to be scheduled for a colonoscopy/Endoscopy l/m with husband for the patient to return my call

## 2018-09-25 NOTE — Telephone Encounter (Signed)
[facility] Pre-operative Risk Assessment     Request for surgical clearance:     Endoscopy Procedure  What type of surgery is being performed?     Colonoscopy/Endoscopy   When is this surgery scheduled?     No date yet   What type of clearance is required ?   Pharmacy  Are there any medications that need to be held prior to surgery and how long? 2 days   Practice name and name of physician performing surgery?      [facility]  Dr Silverio Decamp   What is your office phone and fax number?      Phone- [phone]  [phone]  Anesthesia type (None, local, MAC, general) ?       MAC

## 2018-09-25 NOTE — Progress Notes (Signed)
Virtual Visit via Video Note   This visit type was conducted due to national recommendations for restrictions regarding the COVID-19 Pandemic (e.g. social distancing) in an effort to limit this patient's exposure and mitigate transmission in our community.  Due to her co-morbid illnesses, this patient is at least at moderate risk for complications without adequate follow up.  This format is felt to be most appropriate for this patient at this time.  All issues noted in this document were discussed and addressed.  A limited physical exam was performed with this format.  Please refer to the patient's chart for her consent to telehealth for [facility].   Date:  09/25/2018   ID:  [name], DOB Jan 18, 1946, MRN [id number]  Patient Location: Home Provider Location: Home   PCP:  Lajean Manes, MD  Cardiologist:  Sinclair Grooms, MD  Electrophysiologist:  None   Evaluation Performed:  Follow-Up Visit  Chief Complaint:  CAD  History of Present Illness:    [name] is a 73 y.o. female with a hx of PAF on amio and Eliquis, DM, MVP, HLD, fibromyalgia, GERD and OSA.Marland KitchenCHADS VASC 3.  Swelling in legs and hands. No chest pain. She denies orthopnea, PND, and chest pain.  She has not had significant palpitations or syncope.  There is no shortness of breath or physical limitations.  The patient does not have symptoms concerning for COVID-19 infection (fever, chills, cough, or new shortness of breath).    Past Medical History:  Diagnosis Date  . Cataract   . Complication of anesthesia 1975   projectile vomitting  . Diabetes mellitus   . Dyslipidemia   . Dysrhythmia    hx. A.Fib-Cardioversion-converted rhythm  . Fibromyalgia   . GERD (gastroesophageal reflux disease)   . Hypertension   . MVP (mitral valve prolapse)   . OSA (obstructive sleep apnea) 10-18-12   no cpap used now.  [name] PONV (postoperative nausea and vomiting)   . Vitamin D deficiency 10-18-12   Past Surgical  History:  Procedure Laterality Date  . ABDOMINAL HYSTERECTOMY     '95  . BALLOON DILATION N/A 12/03/2012   Procedure: BALLOON DILATION;  Surgeon: [name], MD;  Location: [facility];  Service: Endoscopy;  Laterality: N/A;  . BREAST EXCISIONAL BIOPSY Left   . CARDIOVERSION  07/28/2011   Procedure: CARDIOVERSION;  Surgeon: Sinclair Grooms, MD;  Location: [facility];  Service: Cardiovascular;  Laterality: N/A;  . CATARACT EXTRACTION Left 10-18-12   10'13  . COLONOSCOPY WITH PROPOFOL N/A 03/29/2015   Procedure: COLONOSCOPY WITH PROPOFOL;  Surgeon: [name], MD;  Location: [facility];  Service: Endoscopy;  Laterality: N/A;  . ESOPHAGEAL MANOMETRY N/A 05/19/2013   Procedure: ESOPHAGEAL MANOMETRY (EM);  Surgeon: [name], MD;  Location: [facility];  Service: Endoscopy;  Laterality: N/A;  . ESOPHAGOGASTRODUODENOSCOPY (EGD) WITH PROPOFOL N/A 12/03/2012   Procedure: ESOPHAGOGASTRODUODENOSCOPY (EGD) WITH Dilation & w/PROPOFOL;  Surgeon: [name], MD;  Location: [facility];  Service: Endoscopy;  Laterality: N/A;  . RETINAL DETACHMENT SURGERY Bilateral 11/2016  . THYROIDECTOMY  1981  . TUBAL LIGATION       No outpatient medications have been marked as taking for the 09/26/18 encounter (Appointment) with Belva Crome, MD.     Allergies:   Iohexol; Morphine and related; Penicillins; Demerol; Victoza [liraglutide]; Dabigatran etexilate mesylate; and Latex   Social History   Tobacco Use  . Smoking status: Never Smoker  . Smokeless tobacco: Never  Used  Substance Use Topics  . Alcohol use: No  . Drug use: No     Family Hx: The patient's family history includes Atrial fibrillation in her father; Breast cancer in her maternal aunt; Bronchitis in her father; Diabetes type II in her father; Heart disease in her father; Hypertension in her mother; Hyperthyroidism in her mother; Kidney cancer in her sister.  ROS:   Please see the history of present illness.    Having  anxiety and depression -> Dec- April 2020.  Medication up titration was necessary.  She now feels better.  She has an upcoming colonoscopy. All other systems reviewed and are negative.   Prior CV studies:   The following studies were reviewed today: No recent imaging.  Labs/Other Tests and Data Reviewed:    EKG:  02/2018 reavealed NSR without acute abnormality.  Recent Labs: 10/08/2017: TSH 3.690   Recent Lipid Panel No results found for: CHOL, TRIG, HDL, CHOLHDL, LDLCALC, LDLDIRECT  Wt Readings from Last 3 Encounters:  09/24/18 228 lb 12.8 oz (103.8 kg)  03/14/18 228 lb 12.8 oz (103.8 kg)  08/23/17 218 lb 6.4 oz (99.1 kg)     Objective:    Vital Signs:  There were no vitals taken for this visit.   VITAL SIGNS:  reviewed  ASSESSMENT & PLAN:    1. Paroxysmal atrial fibrillation (HCC)   2. On amiodarone therapy   3. Obstructive sleep apnea   4. Essential hypertension   5. Anticoagulation goal of INR 2 to 3   6. Long term current use of amiodarone   7. Pre-op evaluation   8. Educated About Covid-19 Virus Infection    PLAN:  1. Currently asymptomatic and presumed continued rhythm control on amiodarone 100 mg/day 2. TSH, PA and lateral chest x-ray, and liver panel will be done at [facility] for her convenience.  79-month clinical follow-up. 3. Compliant with CPAP 4. Target 130/80 mmHg.  Okay to use furosemide 10 mg daily. 5. No bleeding complications on anticoagulation therapy. 6. She is cleared for upcoming colonoscopy.  Apixaban should be held for 4 doses prior to colonoscopy.  We will reach out to her gastroenterologist to communicate our recommendations.  Target BP: <130/80 mmHg  Diet and lifestyle measures for BP control were reviewed in detail: Low sodium diet (<2.5 gm daily); alcohol restriction (<3 ounces per day); weight loss (Mediterranean); avoid non-steroidal agents; > 6 hours sleep per day; 150 min moderate exercise    COVID-19 Education: The signs  and symptoms of COVID-19 were discussed with the patient and how to seek care for testing (follow up with PCP or arrange E-visit).  The importance of social distancing was discussed today.  Time:   Today, I have spent 15 minutes with the patient with telehealth technology discussing the above problems.     Medication Adjustments/Labs and Tests Ordered: Current medicines are reviewed at length with the patient today.  Concerns regarding medicines are outlined above.   Tests Ordered: No orders of the defined types were placed in this encounter.   Medication Changes: No orders of the defined types were placed in this encounter.   Disposition:  Follow up in 6 month(s)  Signed, Sinclair Grooms, MD  09/25/2018 2:59 PM    [place]

## 2018-09-25 NOTE — Telephone Encounter (Signed)
Okay to hold Apixaban for 48 hours prior to colonoscopy.

## 2018-09-26 ENCOUNTER — Other Ambulatory Visit: Payer: Self-pay

## 2018-09-26 ENCOUNTER — Ambulatory Visit: Payer: Medicare Other | Admitting: Interventional Cardiology

## 2018-09-26 ENCOUNTER — Telehealth (INDEPENDENT_AMBULATORY_CARE_PROVIDER_SITE_OTHER): Payer: Medicare Other | Admitting: Interventional Cardiology

## 2018-09-26 ENCOUNTER — Encounter: Payer: Self-pay | Admitting: Interventional Cardiology

## 2018-09-26 VITALS — BP 118/68 | HR 80 | Ht 65.0 in | Wt 223.0 lb

## 2018-09-26 DIAGNOSIS — I480 Paroxysmal atrial fibrillation: Secondary | ICD-10-CM

## 2018-09-26 DIAGNOSIS — I10 Essential (primary) hypertension: Secondary | ICD-10-CM

## 2018-09-26 DIAGNOSIS — Z79899 Other long term (current) drug therapy: Secondary | ICD-10-CM

## 2018-09-26 DIAGNOSIS — Z5181 Encounter for therapeutic drug level monitoring: Secondary | ICD-10-CM

## 2018-09-26 DIAGNOSIS — Z7189 Other specified counseling: Secondary | ICD-10-CM

## 2018-09-26 DIAGNOSIS — G4733 Obstructive sleep apnea (adult) (pediatric): Secondary | ICD-10-CM

## 2018-09-26 DIAGNOSIS — Z7901 Long term (current) use of anticoagulants: Secondary | ICD-10-CM

## 2018-09-26 DIAGNOSIS — Z01818 Encounter for other preprocedural examination: Secondary | ICD-10-CM

## 2018-09-26 NOTE — Patient Instructions (Signed)
Medication Instructions:  Your physician recommends that you continue on your current medications as directed. Please refer to the Current Medication list given to you today.  If you need a refill on your cardiac medications before your next appointment, please call your pharmacy.   Lab work: TSH, Liver and BMET at [facility]  If you have labs (blood work) drawn today and your tests are completely normal, you will receive your results only by: [name] MyChart Message (if you have MyChart) OR . A paper copy in the mail If you have any lab test that is abnormal or we need to change your treatment, we will call you to review the results.  Testing/Procedures: A chest x-ray takes a picture of the organs and structures inside the chest, including the heart, lungs, and blood vessels. This test can show several things, including, whether the heart is enlarges; whether fluid is building up in the lungs; and whether pacemaker / defibrillator leads are still in place.   Follow-Up: At [facility], you and your health needs are our priority.  As part of our continuing mission to provide you with exceptional heart care, we have created designated Provider Care Teams.  These Care Teams include your primary Cardiologist (physician) and Advanced Practice Providers (APPs -  Physician Assistants and Nurse Practitioners) who all work together to provide you with the care you need, when you need it. You will need a follow up appointment in 6 months.  Please call our office 2 months in advance to schedule this appointment.  You may see Sinclair Grooms, MD or one of the following Advanced Practice Providers on your designated Care Team:   Truitt Merle, NP Cecilie Kicks, NP . Kathyrn Drown, NP  Any Other Special Instructions Will Be Listed Below (If Applicable).

## 2018-09-27 NOTE — Telephone Encounter (Signed)
   Primary Cardiologist: Sinclair Grooms, MD  Chart reviewed as part of pre-operative protocol coverage. Pt was seen and cleared by Dr Tamala Julian today.   OK to hold Eliquis for four doses pre op.   I will route this recommendation to the requesting party via Epic fax function and remove from pre-op pool.  Please call with questions.  Kerin Ransom, PA-C 09/27/2018, 4:09 PM

## 2018-09-30 ENCOUNTER — Other Ambulatory Visit: Payer: Self-pay | Admitting: Interventional Cardiology

## 2018-09-30 NOTE — Telephone Encounter (Signed)
Prescription refill request for Eliquis received.  Last office visit: (09/26/2018 )Dr. Tamala Julian Scr: 1.08 (06-05-2018) Age: 73 yrs old Weight: 103.8 kg   Prescription refill sent

## 2018-10-15 DIAGNOSIS — F331 Major depressive disorder, recurrent, moderate: Secondary | ICD-10-CM | POA: Diagnosis not present

## 2018-10-23 ENCOUNTER — Other Ambulatory Visit: Payer: Self-pay | Admitting: Interventional Cardiology

## 2018-10-23 MED ORDER — METOPROLOL TARTRATE 25 MG PO TABS: 25.0000 mg | ORAL_TABLET | Freq: Two times a day (BID) | ORAL | 3 refills | Status: DC

## 2018-11-12 NOTE — Telephone Encounter (Signed)
Patient does not want to schedule procedures at this time will call back next month to schedule.. Patient was already ok'd to hold blood thinner from [place] telephone note

## 2018-12-09 DIAGNOSIS — I480 Paroxysmal atrial fibrillation: Secondary | ICD-10-CM | POA: Diagnosis not present

## 2018-12-09 DIAGNOSIS — M797 Fibromyalgia: Secondary | ICD-10-CM | POA: Diagnosis not present

## 2018-12-09 DIAGNOSIS — D6869 Other thrombophilia: Secondary | ICD-10-CM | POA: Diagnosis not present

## 2018-12-09 DIAGNOSIS — Z79899 Other long term (current) drug therapy: Secondary | ICD-10-CM | POA: Diagnosis not present

## 2018-12-09 DIAGNOSIS — L719 Rosacea, unspecified: Secondary | ICD-10-CM | POA: Diagnosis not present

## 2018-12-09 DIAGNOSIS — E1142 Type 2 diabetes mellitus with diabetic polyneuropathy: Secondary | ICD-10-CM | POA: Diagnosis not present

## 2018-12-09 DIAGNOSIS — R269 Unspecified abnormalities of gait and mobility: Secondary | ICD-10-CM | POA: Diagnosis not present

## 2018-12-09 DIAGNOSIS — M545 Low back pain: Secondary | ICD-10-CM | POA: Diagnosis not present

## 2018-12-09 DIAGNOSIS — N183 Chronic kidney disease, stage 3 (moderate): Secondary | ICD-10-CM | POA: Diagnosis not present

## 2018-12-09 DIAGNOSIS — E1121 Type 2 diabetes mellitus with diabetic nephropathy: Secondary | ICD-10-CM | POA: Diagnosis not present

## 2018-12-10 DIAGNOSIS — E1121 Type 2 diabetes mellitus with diabetic nephropathy: Secondary | ICD-10-CM | POA: Diagnosis not present

## 2018-12-10 DIAGNOSIS — E7800 Pure hypercholesterolemia, unspecified: Secondary | ICD-10-CM | POA: Diagnosis not present

## 2018-12-10 DIAGNOSIS — N183 Chronic kidney disease, stage 3 (moderate): Secondary | ICD-10-CM | POA: Diagnosis not present

## 2018-12-10 DIAGNOSIS — E1165 Type 2 diabetes mellitus with hyperglycemia: Secondary | ICD-10-CM | POA: Diagnosis not present

## 2018-12-10 DIAGNOSIS — I480 Paroxysmal atrial fibrillation: Secondary | ICD-10-CM | POA: Diagnosis not present

## 2018-12-10 DIAGNOSIS — E1142 Type 2 diabetes mellitus with diabetic polyneuropathy: Secondary | ICD-10-CM | POA: Diagnosis not present

## 2018-12-10 DIAGNOSIS — F331 Major depressive disorder, recurrent, moderate: Secondary | ICD-10-CM | POA: Diagnosis not present

## 2018-12-26 ENCOUNTER — Ambulatory Visit
Admission: RE | Admit: 2018-12-26 | Discharge: 2018-12-26 | Disposition: A | Discharge status: Home / Self Care | Payer: Medicare Other | Source: Ambulatory Visit | Attending: Interventional Cardiology | Admitting: Interventional Cardiology

## 2018-12-26 ENCOUNTER — Other Ambulatory Visit: Payer: Self-pay | Admitting: Interventional Cardiology

## 2018-12-26 ENCOUNTER — Telehealth: Payer: Self-pay | Admitting: Interventional Cardiology

## 2018-12-26 DIAGNOSIS — I2510 Atherosclerotic heart disease of native coronary artery without angina pectoris: Secondary | ICD-10-CM | POA: Diagnosis not present

## 2018-12-26 DIAGNOSIS — C130 Malignant neoplasm of postcricoid region: Secondary | ICD-10-CM | POA: Diagnosis not present

## 2018-12-26 DIAGNOSIS — I25119 Atherosclerotic heart disease of native coronary artery with unspecified angina pectoris: Secondary | ICD-10-CM

## 2018-12-26 DIAGNOSIS — E1121 Type 2 diabetes mellitus with diabetic nephropathy: Secondary | ICD-10-CM | POA: Diagnosis not present

## 2018-12-26 DIAGNOSIS — Z23 Encounter for immunization: Secondary | ICD-10-CM | POA: Diagnosis not present

## 2018-12-26 DIAGNOSIS — I517 Cardiomegaly: Secondary | ICD-10-CM | POA: Diagnosis not present

## 2018-12-26 DIAGNOSIS — Z79899 Other long term (current) drug therapy: Secondary | ICD-10-CM | POA: Diagnosis not present

## 2018-12-26 NOTE — Telephone Encounter (Signed)
Orders placed in folder at check in for pt's husband to pick up.

## 2018-12-26 NOTE — Telephone Encounter (Signed)
New Message:   Pt said she lost her orders for her lab work and chest x-ray. Pt would like new orders please. She  says her husband is on his way to get them now please. She is not having them here.

## 2018-12-27 LAB — HEPATIC FUNCTION PANEL
ALT: 20 IU/L (ref 0–32)
AST: 25 IU/L (ref 0–40)
Albumin: 4.1 g/dL (ref 3.7–4.7)
Alkaline Phosphatase: 119 IU/L — ABNORMAL HIGH (ref 39–117)
Bilirubin Total: 0.3 mg/dL (ref 0.0–1.2)
Bilirubin, Direct: 0.11 mg/dL (ref 0.00–0.40)
Total Protein: 6.4 g/dL (ref 6.0–8.5)

## 2018-12-27 LAB — TSH: TSH: 1.71 u[IU]/mL (ref 0.450–4.500)

## 2019-01-03 ENCOUNTER — Telehealth: Payer: Self-pay

## 2019-01-03 NOTE — Telephone Encounter (Signed)
Notes recorded by Frederik Schmidt, RN on 01/03/2019 at 2:02 PM EDT  The patient has been notified of the result and verbalized understanding. All questions (if any) were answered.  Frederik Schmidt, RN 01/03/2019 2:02 PM

## 2019-01-03 NOTE — Telephone Encounter (Signed)
Notes recorded by Frederik Schmidt, RN on 01/03/2019 at 2:01 PM EDT  The patient has been notified of the result and verbalized understanding. All questions (if any) were answered.  Frederik Schmidt, RN 01/03/2019 2:01 PM

## 2019-01-14 DIAGNOSIS — E1142 Type 2 diabetes mellitus with diabetic polyneuropathy: Secondary | ICD-10-CM | POA: Diagnosis not present

## 2019-01-14 DIAGNOSIS — E1165 Type 2 diabetes mellitus with hyperglycemia: Secondary | ICD-10-CM | POA: Diagnosis not present

## 2019-01-14 DIAGNOSIS — E7800 Pure hypercholesterolemia, unspecified: Secondary | ICD-10-CM | POA: Diagnosis not present

## 2019-01-14 DIAGNOSIS — F331 Major depressive disorder, recurrent, moderate: Secondary | ICD-10-CM | POA: Diagnosis not present

## 2019-01-14 DIAGNOSIS — N183 Chronic kidney disease, stage 3 (moderate): Secondary | ICD-10-CM | POA: Diagnosis not present

## 2019-01-14 DIAGNOSIS — E1121 Type 2 diabetes mellitus with diabetic nephropathy: Secondary | ICD-10-CM | POA: Diagnosis not present

## 2019-01-14 DIAGNOSIS — I480 Paroxysmal atrial fibrillation: Secondary | ICD-10-CM | POA: Diagnosis not present

## 2019-02-19 DIAGNOSIS — E1165 Type 2 diabetes mellitus with hyperglycemia: Secondary | ICD-10-CM | POA: Diagnosis not present

## 2019-02-19 DIAGNOSIS — E1121 Type 2 diabetes mellitus with diabetic nephropathy: Secondary | ICD-10-CM | POA: Diagnosis not present

## 2019-02-19 DIAGNOSIS — E1142 Type 2 diabetes mellitus with diabetic polyneuropathy: Secondary | ICD-10-CM | POA: Diagnosis not present

## 2019-02-19 DIAGNOSIS — I480 Paroxysmal atrial fibrillation: Secondary | ICD-10-CM | POA: Diagnosis not present

## 2019-02-19 DIAGNOSIS — F331 Major depressive disorder, recurrent, moderate: Secondary | ICD-10-CM | POA: Diagnosis not present

## 2019-02-19 DIAGNOSIS — E7800 Pure hypercholesterolemia, unspecified: Secondary | ICD-10-CM | POA: Diagnosis not present

## 2019-03-14 ENCOUNTER — Other Ambulatory Visit: Payer: Self-pay | Admitting: Interventional Cardiology

## 2019-03-14 NOTE — Telephone Encounter (Signed)
Prescription refill request for Eliquis received.  Last office visit: [name] 09/26/2018, telemedicine Scr: 1.54, via [facility], 12/26/2018 Age: 73 y.o. Weight: 103.8 kg   Prescription refill sent.

## 2019-03-19 DIAGNOSIS — Z794 Long term (current) use of insulin: Secondary | ICD-10-CM | POA: Diagnosis not present

## 2019-03-19 DIAGNOSIS — N1831 Chronic kidney disease, stage 3a: Secondary | ICD-10-CM | POA: Diagnosis not present

## 2019-03-19 DIAGNOSIS — E1142 Type 2 diabetes mellitus with diabetic polyneuropathy: Secondary | ICD-10-CM | POA: Diagnosis not present

## 2019-04-24 DIAGNOSIS — I480 Paroxysmal atrial fibrillation: Secondary | ICD-10-CM | POA: Diagnosis not present

## 2019-04-24 DIAGNOSIS — E1165 Type 2 diabetes mellitus with hyperglycemia: Secondary | ICD-10-CM | POA: Diagnosis not present

## 2019-04-24 DIAGNOSIS — F331 Major depressive disorder, recurrent, moderate: Secondary | ICD-10-CM | POA: Diagnosis not present

## 2019-04-24 DIAGNOSIS — E1142 Type 2 diabetes mellitus with diabetic polyneuropathy: Secondary | ICD-10-CM | POA: Diagnosis not present

## 2019-04-24 DIAGNOSIS — E1121 Type 2 diabetes mellitus with diabetic nephropathy: Secondary | ICD-10-CM | POA: Diagnosis not present

## 2019-04-24 DIAGNOSIS — E7800 Pure hypercholesterolemia, unspecified: Secondary | ICD-10-CM | POA: Diagnosis not present

## 2019-04-30 DIAGNOSIS — F411 Generalized anxiety disorder: Secondary | ICD-10-CM | POA: Diagnosis not present

## 2019-04-30 DIAGNOSIS — F330 Major depressive disorder, recurrent, mild: Secondary | ICD-10-CM | POA: Diagnosis not present

## 2019-04-30 DIAGNOSIS — Z79891 Long term (current) use of opiate analgesic: Secondary | ICD-10-CM | POA: Diagnosis not present

## 2019-04-30 DIAGNOSIS — R5382 Chronic fatigue, unspecified: Secondary | ICD-10-CM | POA: Diagnosis not present

## 2019-05-13 DIAGNOSIS — H2511 Age-related nuclear cataract, right eye: Secondary | ICD-10-CM | POA: Diagnosis not present

## 2019-06-03 DIAGNOSIS — H2511 Age-related nuclear cataract, right eye: Secondary | ICD-10-CM | POA: Diagnosis not present

## 2019-06-13 DIAGNOSIS — N1832 Chronic kidney disease, stage 3b: Secondary | ICD-10-CM | POA: Diagnosis not present

## 2019-06-13 DIAGNOSIS — Z0000 Encounter for general adult medical examination without abnormal findings: Secondary | ICD-10-CM | POA: Diagnosis not present

## 2019-06-13 DIAGNOSIS — E1142 Type 2 diabetes mellitus with diabetic polyneuropathy: Secondary | ICD-10-CM | POA: Diagnosis not present

## 2019-06-13 DIAGNOSIS — Z79899 Other long term (current) drug therapy: Secondary | ICD-10-CM | POA: Diagnosis not present

## 2019-06-13 DIAGNOSIS — Z1389 Encounter for screening for other disorder: Secondary | ICD-10-CM | POA: Diagnosis not present

## 2019-06-13 DIAGNOSIS — R5382 Chronic fatigue, unspecified: Secondary | ICD-10-CM | POA: Diagnosis not present

## 2019-06-13 DIAGNOSIS — I480 Paroxysmal atrial fibrillation: Secondary | ICD-10-CM | POA: Diagnosis not present

## 2019-06-13 DIAGNOSIS — J3089 Other allergic rhinitis: Secondary | ICD-10-CM | POA: Diagnosis not present

## 2019-06-13 DIAGNOSIS — E7800 Pure hypercholesterolemia, unspecified: Secondary | ICD-10-CM | POA: Diagnosis not present

## 2019-06-13 DIAGNOSIS — Z6839 Body mass index (BMI) 39.0-39.9, adult: Secondary | ICD-10-CM | POA: Diagnosis not present

## 2019-06-13 DIAGNOSIS — M797 Fibromyalgia: Secondary | ICD-10-CM | POA: Diagnosis not present

## 2019-06-13 DIAGNOSIS — E1121 Type 2 diabetes mellitus with diabetic nephropathy: Secondary | ICD-10-CM | POA: Diagnosis not present

## 2019-06-26 ENCOUNTER — Other Ambulatory Visit: Payer: Self-pay

## 2019-06-26 DIAGNOSIS — I480 Paroxysmal atrial fibrillation: Secondary | ICD-10-CM

## 2019-06-26 MED ORDER — AMIODARONE HCL 200 MG PO TABS: 100.0000 mg | ORAL_TABLET | Freq: Every day | ORAL | 1 refills | Status: DC

## 2019-07-09 DIAGNOSIS — I480 Paroxysmal atrial fibrillation: Secondary | ICD-10-CM | POA: Diagnosis not present

## 2019-07-09 DIAGNOSIS — E7800 Pure hypercholesterolemia, unspecified: Secondary | ICD-10-CM | POA: Diagnosis not present

## 2019-07-09 DIAGNOSIS — F331 Major depressive disorder, recurrent, moderate: Secondary | ICD-10-CM | POA: Diagnosis not present

## 2019-07-09 DIAGNOSIS — N1832 Chronic kidney disease, stage 3b: Secondary | ICD-10-CM | POA: Diagnosis not present

## 2019-07-09 DIAGNOSIS — E1142 Type 2 diabetes mellitus with diabetic polyneuropathy: Secondary | ICD-10-CM | POA: Diagnosis not present

## 2019-07-09 DIAGNOSIS — E1121 Type 2 diabetes mellitus with diabetic nephropathy: Secondary | ICD-10-CM | POA: Diagnosis not present

## 2019-08-01 ENCOUNTER — Other Ambulatory Visit: Payer: Self-pay | Admitting: Geriatric Medicine

## 2019-08-01 DIAGNOSIS — Z1231 Encounter for screening mammogram for malignant neoplasm of breast: Secondary | ICD-10-CM

## 2019-08-14 ENCOUNTER — Ambulatory Visit: Payer: Medicare Other

## 2019-08-19 ENCOUNTER — Ambulatory Visit: Payer: Medicare Other | Admitting: Interventional Cardiology

## 2019-08-20 DIAGNOSIS — E1121 Type 2 diabetes mellitus with diabetic nephropathy: Secondary | ICD-10-CM | POA: Diagnosis not present

## 2019-08-20 DIAGNOSIS — E7800 Pure hypercholesterolemia, unspecified: Secondary | ICD-10-CM | POA: Diagnosis not present

## 2019-08-20 DIAGNOSIS — N1832 Chronic kidney disease, stage 3b: Secondary | ICD-10-CM | POA: Diagnosis not present

## 2019-08-20 DIAGNOSIS — I480 Paroxysmal atrial fibrillation: Secondary | ICD-10-CM | POA: Diagnosis not present

## 2019-08-20 DIAGNOSIS — E1142 Type 2 diabetes mellitus with diabetic polyneuropathy: Secondary | ICD-10-CM | POA: Diagnosis not present

## 2019-08-20 DIAGNOSIS — F331 Major depressive disorder, recurrent, moderate: Secondary | ICD-10-CM | POA: Diagnosis not present

## 2019-10-04 DIAGNOSIS — R404 Transient alteration of awareness: Secondary | ICD-10-CM | POA: Diagnosis not present

## 2019-10-04 DIAGNOSIS — R4020 Unspecified coma: Secondary | ICD-10-CM | POA: Diagnosis not present

## 2019-10-04 DIAGNOSIS — E162 Hypoglycemia, unspecified: Secondary | ICD-10-CM | POA: Diagnosis not present

## 2019-10-04 DIAGNOSIS — R0902 Hypoxemia: Secondary | ICD-10-CM | POA: Diagnosis not present

## 2019-10-04 DIAGNOSIS — E161 Other hypoglycemia: Secondary | ICD-10-CM | POA: Diagnosis not present

## 2019-10-14 NOTE — Progress Notes (Deleted)
CARDIOLOGY OFFICE NOTE  Date:  10/20/2019    [name] Date of Birth: 05-May-1945 Medical Record #[id number]  PCP:  Lajean Manes, MD  Cardiologist:  Zack Seal chief complaint on file.   History of Present Illness: [name] is a 74 y.o. female who presents today for a one year check. Seen for Dr. Tamala Julian.   She has a history of PAF - on amiodarone and Eliquis, DM, MVP, HLD, fibromyalgia, GERD, OSA and has an elevated CHADSVASC of 3.   Last seen a year ago by a telehealth visit with Dr. Tamala Julian. Felt to be doing ok. Was cleared for colonoscopy.   The patient {does/does not:200015} have symptoms concerning for COVID-19 infection (fever, chills, cough, or new shortness of breath).   Comes in today. Here with   Past Medical History:  Diagnosis Date  . Cataract   . Complication of anesthesia 1975   projectile vomitting  . Diabetes mellitus   . Dyslipidemia   . Dysrhythmia    hx. A.Fib-Cardioversion-converted rhythm  . Fibromyalgia   . GERD (gastroesophageal reflux disease)   . Hypertension   . MVP (mitral valve prolapse)   . OSA (obstructive sleep apnea) 10-18-12   no cpap used now.  [name] PONV (postoperative nausea and vomiting)   . Vitamin D deficiency 10-18-12    Past Surgical History:  Procedure Laterality Date  . ABDOMINAL HYSTERECTOMY     '95  . BALLOON DILATION N/A 12/03/2012   Procedure: BALLOON DILATION;  Surgeon: [name], MD;  Location: [facility];  Service: Endoscopy;  Laterality: N/A;  . BREAST EXCISIONAL BIOPSY Left   . CARDIOVERSION  07/28/2011   Procedure: CARDIOVERSION;  Surgeon: Sinclair Grooms, MD;  Location: [facility];  Service: Cardiovascular;  Laterality: N/A;  . CATARACT EXTRACTION Left 10-18-12   10'13  . COLONOSCOPY WITH PROPOFOL N/A 03/29/2015   Procedure: COLONOSCOPY WITH PROPOFOL;  Surgeon: [name], MD;  Location: [facility];  Service: Endoscopy;  Laterality: N/A;  . ESOPHAGEAL MANOMETRY N/A 05/19/2013   Procedure:  ESOPHAGEAL MANOMETRY (EM);  Surgeon: [name], MD;  Location: [facility];  Service: Endoscopy;  Laterality: N/A;  . ESOPHAGOGASTRODUODENOSCOPY (EGD) WITH PROPOFOL N/A 12/03/2012   Procedure: ESOPHAGOGASTRODUODENOSCOPY (EGD) WITH Dilation & w/PROPOFOL;  Surgeon: [name], MD;  Location: [facility];  Service: Endoscopy;  Laterality: N/A;  . RETINAL DETACHMENT SURGERY Bilateral 11/2016  . THYROIDECTOMY  1981  . TUBAL LIGATION       Medications: No outpatient medications have been marked as taking for the 10/21/19 encounter (Appointment) with Burtis Junes, NP.     Allergies: Allergies  Allergen Reactions  . Iohexol Other (See Comments)     Desc: EYES & LIPS SWELLING, SOB DURING A MYELOGRAM '88, DECADRON GIVEN/ PRE MEDS REQUIRED/A.C., Onset Date: [id number]   . Morphine And Related Other (See Comments)    hyperactivity  . Penicillins Hives and Swelling    Has patient had a PCN reaction causing immediate rash, facial/tongue/throat swelling, SOB or lightheadedness with hypotension: Yes Has patient had a PCN reaction causing severe rash involving mucus membranes or skin necrosis: No Has patient had a PCN reaction that required hospitalization No Has patient had a PCN reaction occurring within the last 10 years: No If all of the above answers are "NO", then may proceed with Cephalosporin use.   . Demerol Nausea And Vomiting    Can take with Phenergan  . Victoza [Liraglutide]  headaches  . Dabigatran Etexilate Mesylate Other (See Comments)    Headaches   . Latex Rash    Contact dermatitis    Social History: The patient  reports that she has never smoked. She has never used smokeless tobacco. She reports that she does not drink alcohol and does not use drugs.   Family History: The patient's ***family history includes Atrial fibrillation in her father; Breast cancer in her maternal aunt; Bronchitis in her father; Diabetes type II in her father; Heart disease in  her father; Hypertension in her mother; Hyperthyroidism in her mother; Kidney cancer in her sister.   Review of Systems: Please see the history of present illness.   All other systems are reviewed and negative.   Physical Exam: VS:  There were no vitals taken for this visit. [name]  BMI There is no height or weight on file to calculate BMI.  Wt Readings from Last 3 Encounters:  09/26/18 223 lb (101.2 kg)  09/24/18 228 lb 12.8 oz (103.8 kg)  03/14/18 228 lb 12.8 oz (103.8 kg)    General: Pleasant. Well developed, well nourished and in no acute distress.   HEENT: Normal.  Neck: Supple, no JVD, carotid bruits, or masses noted.  Cardiac: ***Regular rate and rhythm. No murmurs, rubs, or gallops. No edema.  Respiratory:  Lungs are clear to auscultation bilaterally with normal work of breathing.  GI: Soft and nontender.  MS: No deformity or atrophy. Gait and ROM intact.  Skin: Warm and dry. Color is normal.  Neuro:  Strength and sensation are intact and no gross focal deficits noted.  Psych: Alert, appropriate and with normal affect.   LABORATORY DATA:  EKG:  EKG {ACTION; IS/IS MLY:[id number]} ordered today.  Personally reviewed by me. This demonstrates ***.  Lab Results  Component Value Date   WBC 8.7 02/24/2015   HGB 15.6 (H) 02/24/2015   HCT 46.7 (H) 02/24/2015   PLT 310 02/24/2015   GLUCOSE 142 (H) 08/23/2017   ALT 20 12/26/2018   AST 25 12/26/2018   NA 145 (H) 08/23/2017   K 4.3 08/23/2017   CL 101 08/23/2017   CREATININE 1.10 (H) 08/23/2017   BUN 11 08/23/2017   CO2 27 08/23/2017   TSH 1.710 12/26/2018       BNP (last 3 results) No results for input(s): BNP in the last 8760 hours.  ProBNP (last 3 results) No results for input(s): PROBNP in the last 8760 hours.   Other Studies Reviewed Today:  Study Highlights October 2017   Nuclear stress EF: 64%.  There was no ST segment deviation noted during stress.  There is a small defect of mild severity present in the  apex location. The defect is non-reversible. This is most consistent with diaphragmatic attenuation artifact. No ischemia noted.  This is a low risk study.  The left ventricular ejection fraction is normal (55-65%).    ECHO Study Conclusions 01/2016  - Left ventricle: The cavity size was normal. Wall thickness was  normal. Systolic function was normal. The estimated ejection  fraction was in the range of 60% to 65%. Wall motion was normal;  there were no regional wall motion abnormalities. Left  ventricular diastolic function parameters were normal.  - Left atrium: The atrium was mildly dilated.  - Atrial septum: No defect or patent foramen ovale was identified.  - Impressions: Normal GLS -17.9.   Impressions:   - Normal GLS -17.9.     ASSESSMENT & PLAN:    1. PAF  2. High risk medicine  3. OSA  4. HTN  5. Chronic anticoagulation  6.    1. Currently asymptomatic and presumed continued rhythm control on amiodarone 100 mg/day 2. TSH, PA and lateral chest x-ray, and liver panel will be done at [facility] for her convenience.  51-month clinical follow-up. 3. Compliant with CPAP 4. Target 130/80 mmHg.  Okay to use furosemide 10 mg daily. 5. No bleeding complications on anticoagulation therapy. 6. She is cleared for upcoming colonoscopy.  Apixaban should be held for 4 doses prior to colonoscopy.  We will reach out to her gastroenterologist to communicate our recommendations.  Target BP: <130/80 mmHg  Diet and lifestyle measures for BP control were reviewed in detail: Low sodium diet (<2.5 gm daily); alcohol restriction (<3 ounces per day); weight loss (Mediterranean); avoid non-steroidal agents; > 6 hours sleep per day; 150 min moderate exercise      Current medicines are reviewed with the patient today.  The patient does not have concerns regarding medicines other than what has been noted above.  The following changes have been made:  See  above.  Labs/ tests ordered today include:   No orders of the defined types were placed in this encounter.    Disposition:   FU with *** in {gen number 5-30:051102} {Days to years:10300}.   Patient is agreeable to this plan and will call if any problems develop in the interim.   SignedTruitt Merle, NP  10/20/2019 1:28 PM  [facility] [address] [place], [place]  [address] Phone: [phone] Fax: [phone]

## 2019-10-21 ENCOUNTER — Ambulatory Visit: Payer: Medicare Other | Admitting: Nurse Practitioner

## 2019-10-21 ENCOUNTER — Ambulatory Visit: Payer: Medicare Other

## 2019-10-28 ENCOUNTER — Telehealth: Payer: Self-pay | Admitting: Interventional Cardiology

## 2019-10-28 NOTE — Telephone Encounter (Signed)
Pt c/o of Chest Pain: STAT if CP now or developed within 24 hours  1. Are you having CP right now? States she is having the pressure in her chest just a little bit, but states it not as bad as it has been.   2. Are you experiencing any other symptoms (ex. SOB, nausea, vomiting, sweating)? SOB, have had some jaw pain.    3. How long have you been experiencing CP? Patient said that she had it some before last appt (7/1) w/ Truitt Merle but has gotten worse in the last week.  4. Is your CP continuous or coming and going? Comes and goes  5. Have you taken Nitroglycerin? No ?
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
## 2019-10-30 ENCOUNTER — Ambulatory Visit: Payer: Medicare Other | Admitting: Cardiology

## 2019-10-30 NOTE — Progress Notes (Signed)
Cardiology Office Note:    Date:  10/31/2019   ID:  [name], DOB 10-16-1945, MRN [id number]  PCP:  Lajean Manes, MD  Cardiologist:  Sinclair Grooms, MD   Referring MD: Lajean Manes, MD   Chief Complaint  Patient presents with  . Atrial Fibrillation  . Hyperlipidemia    History of Present Illness:    [name] is a 74 y.o. female with a hx of PAF on amio and Eliquis, CHADS VASC3, DM, MVP, HLD, fibromyalgia, GERD and OSA.   [name] is having some issues today with low blood sugar.  She checked it before coming into the office and it was 41.  She was given: And now feels better.  Over the last few weeks she has been having 2 pillow orthopnea.  She has also noted some mild lower extremity edema.  She has had intermittent cough and wheezing.  States that Dr. Felipa Eth felt she was having allergy and prescribed Flonase.  She has not had chest pain.  Denies tachycardia.  She has not had syncope.  Therapy for diabetes is insulin only.  Past Medical History:  Diagnosis Date  . Cataract   . Complication of anesthesia 1975   projectile vomitting  . Diabetes mellitus   . Dyslipidemia   . Dysrhythmia    hx. A.Fib-Cardioversion-converted rhythm  . Fibromyalgia   . GERD (gastroesophageal reflux disease)   . Hypertension   . MVP (mitral valve prolapse)   . OSA (obstructive sleep apnea) 10-18-12   no cpap used now.  [name] PONV (postoperative nausea and vomiting)   . Vitamin D deficiency 10-18-12    Past Surgical History:  Procedure Laterality Date  . ABDOMINAL HYSTERECTOMY     '95  . BALLOON DILATION N/A 12/03/2012   Procedure: BALLOON DILATION;  Surgeon: [name], MD;  Location: [facility];  Service: Endoscopy;  Laterality: N/A;  . BREAST EXCISIONAL BIOPSY Left   . CARDIOVERSION  07/28/2011   Procedure: CARDIOVERSION;  Surgeon: Sinclair Grooms, MD;  Location: [facility];  Service: Cardiovascular;  Laterality: N/A;  . CATARACT EXTRACTION Left 10-18-12    10'13  . COLONOSCOPY WITH PROPOFOL N/A 03/29/2015   Procedure: COLONOSCOPY WITH PROPOFOL;  Surgeon: [name], MD;  Location: [facility];  Service: Endoscopy;  Laterality: N/A;  . ESOPHAGEAL MANOMETRY N/A 05/19/2013   Procedure: ESOPHAGEAL MANOMETRY (EM);  Surgeon: [name], MD;  Location: [facility];  Service: Endoscopy;  Laterality: N/A;  . ESOPHAGOGASTRODUODENOSCOPY (EGD) WITH PROPOFOL N/A 12/03/2012   Procedure: ESOPHAGOGASTRODUODENOSCOPY (EGD) WITH Dilation & w/PROPOFOL;  Surgeon: [name], MD;  Location: [facility];  Service: Endoscopy;  Laterality: N/A;  . RETINAL DETACHMENT SURGERY Bilateral 11/2016  . THYROIDECTOMY  1981  . TUBAL LIGATION      Current Medications: Current Meds  Medication Sig  . acetaminophen (TYLENOL) 500 MG tablet Take 500 mg by mouth every 6 (six) hours as needed for mild pain or fever.   [name] amiodarone (PACERONE) 200 MG tablet Take 0.5 tablets (100 mg total) by mouth daily.  [name] amphetamine-dextroamphetamine (ADDERALL) 20 MG tablet Take 20 mg by mouth 3 (three) times daily.   [name] atorvastatin (LIPITOR) 80 MG tablet Take 80 mg by mouth at bedtime.   . BD PEN NEEDLE NANO U/F 32G X 4 MM MISC   . buPROPion (WELLBUTRIN SR) 150 MG 12 hr tablet Take 150 mg by mouth 2 (two) times daily.  [name] buPROPion (WELLBUTRIN XL) 300 MG 24  hr tablet Take 300 mg by mouth every morning.  . Cholecalciferol (VITAMIN D) 125 MCG (5000 UT) CAPS Take 5,000 Units by mouth daily.   . clindamycin (CLEOCIN) 300 MG capsule Take 300 mg by mouth 3 (three) times daily.  [name] ELIQUIS 5 MG TABS tablet TAKE 1 TABLET TWICE A DAY  . esomeprazole (NEXIUM) 40 MG capsule TAKE 1 CAPSULE TWICE DAILY BEFORE MEALS  . FREESTYLE LITE test strip 4 (four) times daily.  . furosemide (LASIX) 20 MG tablet Take 0.5 tablets (10 mg total) by mouth daily as needed for fluid.  . Insulin Glargine (BASAGLAR KWIKPEN) 100 UNIT/ML SOPN Inject 70 Units into the skin daily.   . insulin lispro (HUMALOG) 100  UNIT/ML injection Inject 35 Units into the skin 3 (three) times daily.   . metoprolol tartrate (LOPRESSOR) 25 MG tablet Take 1 tablet (25 mg total) by mouth 2 (two) times daily.  [name] MOVANTIK 12.5 MG TABS tablet Take 12.5 mg by mouth daily.  [name] oxyCODONE (OXY IR/ROXICODONE) 5 MG immediate release tablet Take 5 mg by mouth every 6 (six) hours as needed (for breakthrough pain).  [name] oxyCODONE (OXYCONTIN) 40 MG 12 hr tablet Take 40 mg by mouth every 8 (eight) hours.   . pregabalin (LYRICA) 150 MG capsule Take 150 mg by mouth. 1 in the am and 2 in the pm  . sertraline (ZOLOFT) 50 MG tablet Take 50 mg by mouth 2 (two) times a day.   [name] tiZANidine (ZANAFLEX) 2 MG tablet Take 2 mg by mouth 3 (three) times daily as needed.     Allergies:   Iohexol, Morphine and related, Penicillins, Demerol, Victoza [liraglutide], Dabigatran etexilate mesylate, and Latex   Social History   Socioeconomic History  . Marital status: Married    Spouse name: Not on file  . Number of children: Not on file  . Years of education: Not on file  . Highest education level: Not on file  Occupational History  . Not on file  Tobacco Use  . Smoking status: Never Smoker  . Smokeless tobacco: Never Used  Vaping Use  . Vaping Use: Never used  Substance and Sexual Activity  . Alcohol use: No  . Drug use: No  . Sexual activity: Yes  Other Topics Concern  . Not on file  Social History Narrative  . Not on file   Social Determinants of Health   Financial Resource Strain:   . Difficulty of Paying Living Expenses:   Food Insecurity:   . Worried About Charity fundraiser in the Last Year:   . Arboriculturist in the Last Year:   Transportation Needs:   . Film/video editor (Medical):   [name] Lack of Transportation (Non-Medical):   Physical Activity:   . Days of Exercise per Week:   . Minutes of Exercise per Session:   Stress:   . Feeling of Stress :   Social Connections:   . Frequency of Communication with Friends and  Family:   . Frequency of Social Gatherings with Friends and Family:   . Attends Religious Services:   . Active Member of Clubs or Organizations:   . Attends Archivist Meetings:   [name] Marital Status:      Family History: The patient's family history includes Atrial fibrillation in her father; Breast cancer in her maternal aunt; Bronchitis in her father; Diabetes type II in her father; Heart disease in her father; Hypertension in her mother; Hyperthyroidism in her mother; Kidney  cancer in her sister.  ROS:   Please see the history of present illness.    She wonders about potential side effects from amiodarone.  She has occasional right neck and jaw pain that is nonexertional.  Otherwise there are no complaints other than the stress of dealing with chronic medical conditions affecting her husband who has stage IV CKD and may need dialysis.  All other systems reviewed and are negative.  EKGs/Labs/Other Studies Reviewed:    The following studies were reviewed today: No recent imaging  EKG:  EKG sinus rhythm, otherwise normal.  Recent Labs: 12/26/2018: ALT 20; TSH 1.710  Recent Lipid Panel No results found for: CHOL, TRIG, HDL, CHOLHDL, VLDL, LDLCALC, LDLDIRECT  Physical Exam:    VS:  BP (!) 94/52   Pulse 63   Ht 5' 4.5" (1.638 m)   Wt 220 lb 12.8 oz (100.2 kg)   SpO2 93%   BMI 37.31 kg/m     Wt Readings from Last 3 Encounters:  10/31/19 220 lb 12.8 oz (100.2 kg)  09/26/18 223 lb (101.2 kg)  09/24/18 228 lb 12.8 oz (103.8 kg)     GEN: Moderate/morbid obesity. No acute distress HEENT: Normal NECK: No JVD. LYMPHATICS: No lymphadenopathy CARDIAC:  RRR without murmur, gallop, or edema. VASCULAR:  Normal Pulses. No bruits. RESPIRATORY:  Clear to auscultation without rales, wheezing or rhonchi  ABDOMEN: Soft, non-tender, non-distended, No pulsatile mass, MUSCULOSKELETAL: No deformity  SKIN: Warm and dry NEUROLOGIC:  Alert and oriented x 3 PSYCHIATRIC:  Normal affect     ASSESSMENT:    1. Paroxysmal atrial fibrillation (HCC)   2. On amiodarone therapy   3. Obstructive sleep apnea   4. Essential hypertension   5. Anticoagulation goal of INR 2 to 3   6. Educated about COVID-19 virus infection    PLAN:    In order of problems listed above:  1. Sinus bradycardia/sinus rhythm noted today.  Amiodarone 100 mg remains efficacious and maintaining rhythm.  Chads Vascular score is high and requires Eliquis therapy. 2. TSH, PA and lateral chest x-ray, and liver panel will be done.  Continue same dose. 3. Encourage CPAP compliance 4. Excellent blood pressure control with therapy including metoprolol tartrate 25 mg twice daily, furosemide, otherwise unremarkable. 5. Continue Eliquis.  Monitor for evidence of bleeding. 6. COVID-19 19 vaccine has been received.  She is practicing social distancing.  PA lateral chest x-ray, C-Met, CBC, TSH and BNP will be done to monitor for amiodarone toxicity, Eliquis, and shortness of breath related to fluid overload.  6 to 34-monthfollow-up.   Medication Adjustments/Labs and Tests Ordered: Current medicines are reviewed at length with the patient today.  Concerns regarding medicines are outlined above.  Orders Placed This Encounter  Procedures  . Basic metabolic panel  . Hepatic function panel  . TSH  . CBC  . Pro b natriuretic peptide  . EKG 12-Lead  . EKG 12-Lead   No orders of the defined types were placed in this encounter.   Patient Instructions  Medication Instructions:  Your physician recommends that you continue on your current medications as directed. Please refer to the Current Medication list given to you today.  *If you need a refill on your cardiac medications before your next appointment, please call your pharmacy*   Lab Work: BMET, CBC, Liver, TSH and Pro BNP today  If you have labs (blood work) drawn today and your tests are completely normal, you will receive your results only by: .Marland KitchenMyChart  Message (if you have MyChart) OR . A paper copy in the mail If you have any lab test that is abnormal or we need to change your treatment, we will call you to review the results.   Testing/Procedures: A chest x-ray takes a picture of the organs and structures inside the chest, including the heart, lungs, and blood vessels. This test can show several things, including, whether the heart is enlarges; whether fluid is building up in the lungs; and whether pacemaker / defibrillator leads are still in place.   Follow-Up: At [facility], you and your health needs are our priority.  As part of our continuing mission to provide you with exceptional heart care, we have created designated Provider Care Teams.  These Care Teams include your primary Cardiologist (physician) and Advanced Practice Providers (APPs -  Physician Assistants and Nurse Practitioners) who all work together to provide you with the care you need, when you need it.  We recommend signing up for the patient portal called "MyChart".  Sign up information is provided on this After Visit Summary.  MyChart is used to connect with patients for Virtual Visits (Telemedicine).  Patients are able to view lab/test results, encounter notes, upcoming appointments, etc.  Non-urgent messages can be sent to your provider as well.   To learn more about what you can do with MyChart, go to [email or web address].    Your next appointment:   6 month(s)  The format for your next appointment:   In Person  Provider:   You may see Sinclair Grooms, MD or one of the following Advanced Practice Providers on your designated Care Team:    Truitt Merle, NP  Cecilie Kicks, NP  Kathyrn Drown, NP    Other Instructions      Signed, Sinclair Grooms, MD  10/31/2019 3:04 PM    [place]

## 2019-10-31 ENCOUNTER — Encounter: Payer: Self-pay | Admitting: Interventional Cardiology

## 2019-10-31 ENCOUNTER — Ambulatory Visit (INDEPENDENT_AMBULATORY_CARE_PROVIDER_SITE_OTHER): Payer: Medicare Other | Admitting: Interventional Cardiology

## 2019-10-31 ENCOUNTER — Other Ambulatory Visit: Payer: Self-pay

## 2019-10-31 VITALS — BP 94/52 | HR 63 | Ht 64.5 in | Wt 220.8 lb

## 2019-10-31 DIAGNOSIS — G4733 Obstructive sleep apnea (adult) (pediatric): Secondary | ICD-10-CM

## 2019-10-31 DIAGNOSIS — Z7189 Other specified counseling: Secondary | ICD-10-CM

## 2019-10-31 DIAGNOSIS — Z7901 Long term (current) use of anticoagulants: Secondary | ICD-10-CM | POA: Diagnosis not present

## 2019-10-31 DIAGNOSIS — I480 Paroxysmal atrial fibrillation: Secondary | ICD-10-CM

## 2019-10-31 DIAGNOSIS — R0602 Shortness of breath: Secondary | ICD-10-CM | POA: Diagnosis not present

## 2019-10-31 DIAGNOSIS — I10 Essential (primary) hypertension: Secondary | ICD-10-CM

## 2019-10-31 DIAGNOSIS — Z79899 Other long term (current) drug therapy: Secondary | ICD-10-CM | POA: Diagnosis not present

## 2019-10-31 DIAGNOSIS — Z5181 Encounter for therapeutic drug level monitoring: Secondary | ICD-10-CM

## 2019-10-31 NOTE — Patient Instructions (Signed)
Medication Instructions:  Your physician recommends that you continue on your current medications as directed. Please refer to the Current Medication list given to you today.  *If you need a refill on your cardiac medications before your next appointment, please call your pharmacy*   Lab Work: BMET, CBC, Liver, TSH and Pro BNP today  If you have labs (blood work) drawn today and your tests are completely normal, you will receive your results only by: [name] MyChart Message (if you have MyChart) OR . A paper copy in the mail If you have any lab test that is abnormal or we need to change your treatment, we will call you to review the results.   Testing/Procedures: A chest x-ray takes a picture of the organs and structures inside the chest, including the heart, lungs, and blood vessels. This test can show several things, including, whether the heart is enlarges; whether fluid is building up in the lungs; and whether pacemaker / defibrillator leads are still in place.   Follow-Up: At [facility], you and your health needs are our priority.  As part of our continuing mission to provide you with exceptional heart care, we have created designated Provider Care Teams.  These Care Teams include your primary Cardiologist (physician) and Advanced Practice Providers (APPs -  Physician Assistants and Nurse Practitioners) who all work together to provide you with the care you need, when you need it.  We recommend signing up for the patient portal called "MyChart".  Sign up information is provided on this After Visit Summary.  MyChart is used to connect with patients for Virtual Visits (Telemedicine).  Patients are able to view lab/test results, encounter notes, upcoming appointments, etc.  Non-urgent messages can be sent to your provider as well.   To learn more about what you can do with MyChart, go to [email or web address].    Your next appointment:   6 month(s)  The format for your next appointment:    In Person  Provider:   You may see Sinclair Grooms, MD or one of the following Advanced Practice Providers on your designated Care Team:    Truitt Merle, NP  Cecilie Kicks, NP  Kathyrn Drown, NP    Other Instructions

## 2019-11-01 LAB — HEPATIC FUNCTION PANEL
ALT: 27 IU/L (ref 0–32)
AST: 22 IU/L (ref 0–40)
Albumin: 3.9 g/dL (ref 3.7–4.7)
Alkaline Phosphatase: 132 IU/L — ABNORMAL HIGH (ref 48–121)
Bilirubin Total: 0.3 mg/dL (ref 0.0–1.2)
Bilirubin, Direct: 0.11 mg/dL (ref 0.00–0.40)
Total Protein: 6.4 g/dL (ref 6.0–8.5)

## 2019-11-01 LAB — BASIC METABOLIC PANEL
BUN/Creatinine Ratio: 15 (ref 12–28)
BUN: 31 mg/dL — ABNORMAL HIGH (ref 8–27)
CO2: 25 mmol/L (ref 20–29)
Calcium: 8.9 mg/dL (ref 8.7–10.3)
Chloride: 103 mmol/L (ref 96–106)
Creatinine, Ser: 2.1 mg/dL — ABNORMAL HIGH (ref 0.57–1.00)
GFR calc Af Amer: 27 mL/min/{1.73_m2} — ABNORMAL LOW (ref >59)
GFR calc non Af Amer: 23 mL/min/{1.73_m2} — ABNORMAL LOW (ref >59)
Glucose: 54 mg/dL — ABNORMAL LOW (ref 65–99)
Potassium: 4.6 mmol/L (ref 3.5–5.2)
Sodium: 141 mmol/L (ref 134–144)

## 2019-11-01 LAB — PRO B NATRIURETIC PEPTIDE: NT-Pro BNP: 172 pg/mL (ref 0–301)

## 2019-11-01 LAB — CBC
Hematocrit: 41.2 % (ref 34.0–46.6)
Hemoglobin: 14 g/dL (ref 11.1–15.9)
MCH: 30 pg (ref 26.6–33.0)
MCHC: 34 g/dL (ref 31.5–35.7)
MCV: 88 fL (ref 79–97)
Platelets: 362 10*3/uL (ref 150–450)
RBC: 4.66 x10E6/uL (ref 3.77–5.28)
RDW: 13.5 % (ref 11.7–15.4)
WBC: 11.8 10*3/uL — ABNORMAL HIGH (ref 3.4–10.8)

## 2019-11-01 LAB — TSH: TSH: 5.38 u[IU]/mL — ABNORMAL HIGH (ref 0.450–4.500)

## 2019-11-03 ENCOUNTER — Telehealth: Payer: Self-pay | Admitting: *Deleted

## 2019-11-03 ENCOUNTER — Encounter: Payer: Self-pay | Admitting: *Deleted

## 2019-11-03 ENCOUNTER — Telehealth: Payer: Self-pay | Admitting: Interventional Cardiology

## 2019-11-03 DIAGNOSIS — R7989 Other specified abnormal findings of blood chemistry: Secondary | ICD-10-CM

## 2019-11-03 NOTE — Telephone Encounter (Signed)
Spoke with [name] and went over results and recommendations.  [name] scheduled to see Nephrology in August.  [name] will go to [facility] to have labs drawn. [name] seen Dr. Felipa Eth the same day she seen Dr. Tamala Julian.  Advised I have sent over a copy of our labs and to contact them on next steps.

## 2019-11-03 NOTE — Telephone Encounter (Signed)
-----   Message from Belva Crome, MD sent at 11/01/2019 12:59 PM EDT ----- Let the patient know the kidney function is abnormal. Stop lasix and decrease Eliquis to 2.5 mg BID until kidney back to normal. Increase water intake. Repeat BMT in 5 days. See Dr. Felipa Eth. May need Nephrology. A copy will be sent to Lajean Manes, MD

## 2019-11-03 NOTE — Telephone Encounter (Signed)
Patient is calling back to return Jennifer's call. Transferred call to [facility].

## 2019-11-11 ENCOUNTER — Telehealth: Payer: Self-pay | Admitting: Interventional Cardiology

## 2019-11-11 ENCOUNTER — Ambulatory Visit: Payer: Medicare Other

## 2019-11-11 NOTE — Telephone Encounter (Signed)
Called and left detailed message letting pt know that I called [facility] last week and the lab tech said all I had to do was put the order in Epic and release it and they would be able to see it.  Advised I also mailed the requisition sheet out so it should hopefully be there today.  Advised to call back if any further questions.

## 2019-11-11 NOTE — Telephone Encounter (Signed)
Pt called and wanted to know if Dr. Tamala Julian or nurse would send labs to [facility] since she resides closer there. Please call to discuss with pt.

## 2019-11-14 NOTE — Telephone Encounter (Signed)
Carolanne is calling to follow up about the labs being sent to [place] the patient of what Anderson Malta stated on 7/20, she said that [facility] is not in the same system and does not have Epic to see the orders and that if it was mailed, they still do not have it. [place] asked for them to be faxed to [phone] to the attention of Judeen Hammans, who she believes is the head of lab. Please advise.

## 2019-11-14 NOTE — Telephone Encounter (Signed)
Lab order faxed as requested ./cy

## 2019-11-27 ENCOUNTER — Emergency Department (HOSPITAL_COMMUNITY): Payer: Medicare Other

## 2019-11-27 ENCOUNTER — Emergency Department (HOSPITAL_COMMUNITY)
Admission: EM | Admit: 2019-11-27 | Discharge: 2019-11-27 | Disposition: A | Discharge status: Home / Self Care | Payer: Medicare Other | Attending: Emergency Medicine | Admitting: Emergency Medicine

## 2019-11-27 ENCOUNTER — Other Ambulatory Visit: Payer: Self-pay

## 2019-11-27 ENCOUNTER — Encounter (HOSPITAL_COMMUNITY): Payer: Self-pay

## 2019-11-27 DIAGNOSIS — S60947A Unspecified superficial injury of left little finger, initial encounter: Secondary | ICD-10-CM | POA: Diagnosis present

## 2019-11-27 DIAGNOSIS — E119 Type 2 diabetes mellitus without complications: Secondary | ICD-10-CM | POA: Insufficient documentation

## 2019-11-27 DIAGNOSIS — Y9289 Other specified places as the place of occurrence of the external cause: Secondary | ICD-10-CM | POA: Insufficient documentation

## 2019-11-27 DIAGNOSIS — S61217A Laceration without foreign body of left little finger without damage to nail, initial encounter: Secondary | ICD-10-CM | POA: Diagnosis not present

## 2019-11-27 DIAGNOSIS — Z91040 Latex allergy status: Secondary | ICD-10-CM | POA: Diagnosis not present

## 2019-11-27 DIAGNOSIS — Y9389 Activity, other specified: Secondary | ICD-10-CM | POA: Insufficient documentation

## 2019-11-27 DIAGNOSIS — Z7984 Long term (current) use of oral hypoglycemic drugs: Secondary | ICD-10-CM | POA: Diagnosis not present

## 2019-11-27 DIAGNOSIS — Z23 Encounter for immunization: Secondary | ICD-10-CM | POA: Diagnosis not present

## 2019-11-27 DIAGNOSIS — I10 Essential (primary) hypertension: Secondary | ICD-10-CM | POA: Diagnosis not present

## 2019-11-27 DIAGNOSIS — S62637A Displaced fracture of distal phalanx of left little finger, initial encounter for closed fracture: Secondary | ICD-10-CM | POA: Diagnosis not present

## 2019-11-27 DIAGNOSIS — Y999 Unspecified external cause status: Secondary | ICD-10-CM | POA: Insufficient documentation

## 2019-11-27 DIAGNOSIS — S63259A Unspecified dislocation of unspecified finger, initial encounter: Secondary | ICD-10-CM

## 2019-11-27 DIAGNOSIS — Z79899 Other long term (current) drug therapy: Secondary | ICD-10-CM | POA: Diagnosis not present

## 2019-11-27 DIAGNOSIS — S63287A Dislocation of proximal interphalangeal joint of left little finger, initial encounter: Secondary | ICD-10-CM | POA: Diagnosis not present

## 2019-11-27 DIAGNOSIS — Z7901 Long term (current) use of anticoagulants: Secondary | ICD-10-CM | POA: Insufficient documentation

## 2019-11-27 DIAGNOSIS — S63297A Dislocation of distal interphalangeal joint of left little finger, initial encounter: Secondary | ICD-10-CM | POA: Insufficient documentation

## 2019-11-27 DIAGNOSIS — W108XXA Fall (on) (from) other stairs and steps, initial encounter: Secondary | ICD-10-CM | POA: Insufficient documentation

## 2019-11-27 DIAGNOSIS — S61209A Unspecified open wound of unspecified finger without damage to nail, initial encounter: Secondary | ICD-10-CM

## 2019-11-27 LAB — CBC WITH DIFFERENTIAL/PLATELET
Abs Immature Granulocytes: 0.04 10*3/uL (ref 0.00–0.07)
Basophils Absolute: 0 10*3/uL (ref 0.0–0.1)
Basophils Relative: 0 %
Eosinophils Absolute: 0.2 10*3/uL (ref 0.0–0.5)
Eosinophils Relative: 2 %
HCT: 43 % (ref 36.0–46.0)
Hemoglobin: 13.7 g/dL (ref 12.0–15.0)
Immature Granulocytes: 0 %
Lymphocytes Relative: 20 %
Lymphs Abs: 2.3 10*3/uL (ref 0.7–4.0)
MCH: 29.4 pg (ref 26.0–34.0)
MCHC: 31.9 g/dL (ref 30.0–36.0)
MCV: 92.3 fL (ref 80.0–100.0)
Monocytes Absolute: 0.8 10*3/uL (ref 0.1–1.0)
Monocytes Relative: 7 %
Neutro Abs: 7.9 10*3/uL — ABNORMAL HIGH (ref 1.7–7.7)
Neutrophils Relative %: 71 %
Platelets: 277 10*3/uL (ref 150–400)
RBC: 4.66 MIL/uL (ref 3.87–5.11)
RDW: 13.4 % (ref 11.5–15.5)
WBC: 11.3 10*3/uL — ABNORMAL HIGH (ref 4.0–10.5)
nRBC: 0 % (ref 0.0–0.2)

## 2019-11-27 LAB — BASIC METABOLIC PANEL
Anion gap: 12 (ref 5–15)
BUN: 36 mg/dL — ABNORMAL HIGH (ref 8–23)
CO2: 24 mmol/L (ref 22–32)
Calcium: 8.8 mg/dL — ABNORMAL LOW (ref 8.9–10.3)
Chloride: 104 mmol/L (ref 98–111)
Creatinine, Ser: 2.34 mg/dL — ABNORMAL HIGH (ref 0.44–1.00)
GFR calc Af Amer: 23 mL/min — ABNORMAL LOW (ref >60)
GFR calc non Af Amer: 20 mL/min — ABNORMAL LOW (ref >60)
Glucose, Bld: 91 mg/dL (ref 70–99)
Potassium: 4.4 mmol/L (ref 3.5–5.1)
Sodium: 140 mmol/L (ref 135–145)

## 2019-11-27 MED ORDER — CLINDAMYCIN HCL 300 MG PO CAPS
300.0000 mg | ORAL_CAPSULE | Freq: Three times a day (TID) | ORAL | 0 refills | Status: DC
Start: 2019-11-27 — End: 2021-05-23

## 2019-11-27 MED ORDER — HYDROMORPHONE HCL 1 MG/ML IJ SOLN
1.0000 mg | Freq: Once | INTRAMUSCULAR | Status: AC
  Administered 2019-11-27: 1 mg via INTRAMUSCULAR
  Filled 2019-11-27: qty 1

## 2019-11-27 MED ORDER — LIDOCAINE HCL (PF) 1 % IJ SOLN
5.0000 mL | Freq: Once | INTRAMUSCULAR | Status: AC
  Administered 2019-11-27: 5 mL
  Filled 2019-11-27: qty 5

## 2019-11-27 MED ORDER — SODIUM CHLORIDE 0.9 % IV BOLUS
1000.0000 mL | Freq: Once | INTRAVENOUS | Status: AC
  Administered 2019-11-27: 1000 mL via INTRAVENOUS

## 2019-11-27 MED ORDER — TETANUS-DIPHTH-ACELL PERTUSSIS 5-2.5-18.5 LF-MCG/0.5 IM SUSP
0.5000 mL | Freq: Once | INTRAMUSCULAR | Status: AC
  Administered 2019-11-27: 0.5 mL via INTRAMUSCULAR
  Filled 2019-11-27: qty 0.5

## 2019-11-27 MED ORDER — DOXYCYCLINE HYCLATE 100 MG PO TABS
100.0000 mg | ORAL_TABLET | Freq: Once | ORAL | Status: AC
  Administered 2019-11-27: 100 mg via ORAL
  Filled 2019-11-27: qty 1

## 2019-11-27 MED ORDER — CLINDAMYCIN HCL 300 MG PO CAPS
300.0000 mg | ORAL_CAPSULE | Freq: Three times a day (TID) | ORAL | 0 refills | Status: DC
Start: 2019-11-27 — End: 2019-11-27

## 2019-11-27 NOTE — ED Notes (Signed)
Per Dr. Laverna Peace Level 2 activation.

## 2019-11-27 NOTE — ED Triage Notes (Signed)
Pt arrived POV after a fall at the dentist office. Pt stated she missed a step and fell backwards onto the concrete. Pt states she is unsure if she hit her head but does not have head pain. Pt has an obvious open fracture and dislocation of the left hand 5th digit. Pt is on Eliquis.

## 2019-11-27 NOTE — ED Notes (Signed)
Pt oxygen saturation is 89-90% with good waveform on RA. This RN placed 2L [place] on pt. Oxygen saturation now 95%.

## 2019-11-27 NOTE — ED Provider Notes (Signed)
[facility] EMERGENCY DEPARTMENT Provider Note   CSN: [id number] Arrival date & time: 11/27/19  1516     History Chief Complaint  Patient presents with  . Fall    [name] is a 74 y.o. female.  HPI   56yF presenting after fall. Happened shortly before arrival. Was leaving dentist office when she missed a step and fell. Pain/deformity in L little finger. "The bone is sticking out." Denies significant acute pain elsewhere. On eliquis. She is unsure is she hit her head but denies headache. She has been on her feet since the fall. Unsure of tetanus status.   Past Medical History:  Diagnosis Date  . Cataract   . Complication of anesthesia 1975   projectile vomitting  . Diabetes mellitus   . Dyslipidemia   . Dysrhythmia    hx. A.Fib-Cardioversion-converted rhythm  . Fibromyalgia   . GERD (gastroesophageal reflux disease)   . Hypertension   . MVP (mitral valve prolapse)   . OSA (obstructive sleep apnea) 10-18-12   no cpap used now.  [name] PONV (postoperative nausea and vomiting)   . Vitamin D deficiency 10-18-12    Patient Active Problem List   Diagnosis Date Noted  . Muscle jerks during sleep 05/06/2015  . Achalasia 01/27/2014  . On amiodarone therapy 05/07/2013    Class: Chronic  . Anticoagulation goal of INR 2 to 3 05/07/2013    Class: Chronic  . Atrial fibrillation ([place]) 05/07/2013  . 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 Obstructive sleep apnea 07/10/2007  . Essential hypertension 07/10/2007  . Mitral valve disorder 07/10/2007  . CHRONIC FATIGUE SYNDROME 07/10/2007    Past Surgical History:  Procedure Laterality Date  . ABDOMINAL HYSTERECTOMY     '95  . BALLOON DILATION N/A 12/03/2012   Procedure: BALLOON DILATION;  Surgeon: [name], MD;  Location: [facility];  Service: Endoscopy;  Laterality: N/A;  . BREAST EXCISIONAL BIOPSY Left   . CARDIOVERSION  07/28/2011   Procedure: CARDIOVERSION;  Surgeon: Sinclair Grooms, MD;  Location:  [facility];  Service: Cardiovascular;  Laterality: N/A;  . CATARACT EXTRACTION Left 10-18-12   10'13  . COLONOSCOPY WITH PROPOFOL N/A 03/29/2015   Procedure: COLONOSCOPY WITH PROPOFOL;  Surgeon: [name], MD;  Location: [facility];  Service: Endoscopy;  Laterality: N/A;  . ESOPHAGEAL MANOMETRY N/A 05/19/2013   Procedure: ESOPHAGEAL MANOMETRY (EM);  Surgeon: [name], MD;  Location: [facility];  Service: Endoscopy;  Laterality: N/A;  . ESOPHAGOGASTRODUODENOSCOPY (EGD) WITH PROPOFOL N/A 12/03/2012   Procedure: ESOPHAGOGASTRODUODENOSCOPY (EGD) WITH Dilation & w/PROPOFOL;  Surgeon: [name], MD;  Location: [facility];  Service: Endoscopy;  Laterality: N/A;  . RETINAL DETACHMENT SURGERY Bilateral 11/2016  . THYROIDECTOMY  1981  . TUBAL LIGATION       OB History   No obstetric history on file.     Family History  Problem Relation Age of Onset  . Hypertension Mother   . Hyperthyroidism Mother   . Diabetes type II Father   . Heart disease Father   . Atrial fibrillation Father   . Bronchitis Father   . Kidney cancer Sister   . Breast cancer Maternal Aunt     Social History   Tobacco Use  . Smoking status: Never Smoker  . Smokeless tobacco: Never Used  Vaping Use  . Vaping Use: Never used  Substance Use Topics  . Alcohol use: No  . Drug use: No    Home Medications Prior to  Admission medications   Medication Sig Start Date End Date Taking? Authorizing Provider  acetaminophen (TYLENOL) 500 MG tablet Take 500 mg by mouth every 6 (six) hours as needed for mild pain or fever.     [provider]  amiodarone (PACERONE) 200 MG tablet Take 0.5 tablets (100 mg total) by mouth daily. 06/26/19   Belva Crome, MD  amphetamine-dextroamphetamine (ADDERALL) 20 MG tablet Take 20 mg by mouth 3 (three) times daily.     [provider]  atorvastatin (LIPITOR) 80 MG tablet Take 80 mg by mouth at bedtime.     [provider]  BD PEN NEEDLE NANO U/F  32G X 4 MM MISC  03/22/13   [provider]  buPROPion (WELLBUTRIN SR) 150 MG 12 hr tablet Take 150 mg by mouth 2 (two) times daily. 07/16/17   [provider]  buPROPion (WELLBUTRIN XL) 300 MG 24 hr tablet Take 300 mg by mouth every morning. 09/19/19   [provider]  Cholecalciferol (VITAMIN D) 125 MCG (5000 UT) CAPS Take 5,000 Units by mouth daily.     [provider]  clindamycin (CLEOCIN) 300 MG capsule Take 300 mg by mouth 3 (three) times daily. 09/19/18   [provider]  ELIQUIS 5 MG TABS tablet TAKE 1 TABLET TWICE A DAY 03/14/19   Belva Crome, MD  esomeprazole (NEXIUM) 40 MG capsule TAKE 1 CAPSULE TWICE DAILY BEFORE MEALS 12/07/17   Mauri Pole, MD  FREESTYLE LITE test strip 4 (four) times daily. 09/26/19   [provider]  furosemide (LASIX) 20 MG tablet Take 0.5 tablets (10 mg total) by mouth daily as needed for fluid. 03/25/18   Belva Crome, MD  Insulin Glargine [facility] KWIKPEN) 100 UNIT/ML SOPN Inject 70 Units into the skin daily.     [provider]  insulin lispro (HUMALOG) 100 UNIT/ML injection Inject 35 Units into the skin 3 (three) times daily.     [provider]  metoprolol tartrate (LOPRESSOR) 25 MG tablet Take 1 tablet (25 mg total) by mouth 2 (two) times daily. 10/23/18   Belva Crome, MD  MOVANTIK 12.5 MG TABS tablet Take 12.5 mg by mouth daily. 07/19/17   [provider]  oxyCODONE (OXY IR/ROXICODONE) 5 MG immediate release tablet Take 5 mg by mouth every 6 (six) hours as needed (for breakthrough pain).    [provider]  oxyCODONE (OXYCONTIN) 40 MG 12 hr tablet Take 40 mg by mouth every 8 (eight) hours.     [provider]  pregabalin (LYRICA) 150 MG capsule Take 150 mg by mouth. 1 in the am and 2 in the pm    [provider]  sertraline (ZOLOFT) 50 MG tablet Take 50 mg by mouth 2 (two) times a day.  01/07/18   [provider]  tiZANidine (ZANAFLEX)  2 MG tablet Take 2 mg by mouth 3 (three) times daily as needed. 09/26/19   [provider]  estrogens, conjugated, (PREMARIN) 0.9 MG tablet Take 0.9 mg by mouth daily.    07/18/11  [provider]  glimepiride (AMARYL) 4 MG tablet Take 4 mg by mouth 2 (two) times daily.   07/18/11  [provider]  metFORMIN (GLUCOPHAGE) 500 MG tablet Take 1,000 mg by mouth 2 (two) times daily with a meal.    07/18/11  [provider]    Allergies    Iohexol, Morphine and related, Penicillins, Shellfish allergy, Demerol, Victoza [liraglutide], Dabigatran etexilate mesylate, and  Latex  Review of Systems   Review of Systems All systems reviewed and negative, other than as noted in HPI.  Physical Exam Updated Vital Signs BP (!) 82/51   Pulse 73   Temp 98.5 F (36.9 C) (Oral)   Resp 16   Ht 5\' 5"  (1.651 m)   Wt 95.7 kg   SpO2 91%   BMI 35.11 kg/m   Physical Exam Vitals and nursing note reviewed.  Constitutional:      General: She is not in acute distress.    Appearance: She is well-developed.  HENT:     Head: Normocephalic and atraumatic.  Eyes:     General:        Right eye: No discharge.        Left eye: No discharge.     Conjunctiva/sclera: Conjunctivae normal.  Cardiovascular:     Rate and Rhythm: Normal rate and regular rhythm.     Heart sounds: Normal heart sounds. No murmur heard.  No friction rub. No gallop.   Pulmonary:     Effort: Pulmonary effort is normal. No respiratory distress.     Breath sounds: Normal breath sounds.  Abdominal:     General: There is no distension.     Palpations: Abdomen is soft.     Tenderness: There is no abdominal tenderness.  Musculoskeletal:        General: Tenderness, deformity and signs of injury present.     Cervical back: Neck supple.     Comments: L little finger deformity.  2.5 cm laceration palmar aspect over PIP. Cannot range 2/2 pain. Sensation intact to light touch ulnar aspect. Diminished(?) radial side.   Good cap refill in finger tip. Superficial abrasion to ulnar/dorsal hand but no significant bony tenderness or pain with ROM aside from little finger.   No midline spinal tenderness.   Skin:    General: Skin is warm and dry.  Neurological:     Mental Status: She is alert.  Psychiatric:        Behavior: Behavior normal.        Thought Content: Thought content normal.     ED Results / Procedures / Treatments   Labs (all labs ordered are listed, but only abnormal results are displayed) Labs Reviewed - No data to display  EKG EKG Interpretation  Date/Time:  Thursday November 27 2019 16:01:06 EDT Ventricular Rate:  70 PR Interval:    QRS Duration: 108 QT Interval:  435 QTC Calculation: 470 R Axis:   17 Text Interpretation: Sinus rhythm Short PR interval Low voltage, precordial leads Confirmed by Virgel Manifold [phone]) on 11/27/2019 5:34:25 PM   Radiology DG Hand Complete Left  Result Date: 11/27/2019 CLINICAL DATA:  Open fracture of the digit. EXAM: LEFT HAND - COMPLETE 3+ VIEW COMPARISON:  None. FINDINGS: There is a dislocation of the proximal interphalangeal joint of the fifth digit. There is surrounding soft tissue swelling. There is a small osseous fragment adjacent to the head of the proximal phalanx which is suspicious for a displaced fracture arising from the base of the middle phalanx. There is subluxation of the distal interphalangeal joint of the fifth digit with associated degenerative changes. There are advanced degenerative changes of the first [facility]. IMPRESSION: 1. Dislocation of the proximal interphalangeal joint of the fifth digit. 2. Probable displaced fracture arising from the base of the middle phalanx of the fifth digit. 3. Subluxation of the distal interphalangeal joint of the fifth digit with associated degenerative changes. Electronically Signed  By: Constance Holster M.D.   On: 11/27/2019 15:50   DG Finger Little Left  Result Date: 11/27/2019 CLINICAL DATA:  Left  little finger dislocation. Status post reduction. Initial encounter. EXAM: LEFT LITTLE FINGER 2+V COMPARISON:  Prior today FINDINGS: There has been successful reduction of the previously seen dislocation at the PIP joint. A minimally displaced fracture is seen through the radial base of the distal phalanx. No other fractures or bone lesions identified. IMPRESSION: Successful reduction of previously seen PIP joint dislocation. Minimally displaced fracture involving the base of the distal phalanx. Electronically Signed   By: Marlaine Hind M.D.   On: 11/27/2019 17:18    Procedures Procedures (including critical care time)  LACERATION REPAIR Performed by: Virgel Manifold Authorized by: Virgel Manifold Consent: Verbal consent obtained. Risks and benefits: risks, benefits and alternatives were discussed Consent given by: patient Patient identity confirmed: provided demographic data Prepped and Draped in normal sterile fashion Wound explored  Laceration Location: L litlte finger  Laceration Length: 2.5 cm  No Foreign Bodies seen or palpated  Anesthesia: digital block, 3cc 1% lidocaine w/o epi  Irrigation method: syringe Amount of cleaning: copious  Skin closure: 4-0 prolene  Number of sutures: 5  Technique: simple interrupted   Patient tolerance: Patient tolerated the procedure well with no immediate complications.  Reduction of dislocation Date/Time: 5:18 PM Performed by: Virgel Manifold Authorized by: Virgel Manifold Consent: Verbal consent obtained. Risks and benefits: risks, benefits and alternatives were discussed Consent given by: patient Required items: required blood products, implants, devices, and special equipment available Time out: Immediately prior to procedure a "time out" was called to verify the correct patient, procedure, equipment, support staff and site/side marked as required.  Patient sedated: no, digital block  Vitals: Vital signs were monitored during  sedation. Patient tolerance: Patient tolerated the procedure well with no immediate complications. Joint: L little finger PIP Reduction technique: traction  Reduction of dislocation Date/Time: 5:18 PM Performed by: Virgel Manifold Authorized by: Virgel Manifold Consent: Verbal consent obtained. Risks and benefits: risks, benefits and alternatives were discussed Consent given by: patient Required items: required blood products, implants, devices, and special equipment available Time out: Immediately prior to procedure a "time out" was called to verify the correct patient, procedure, equipment, support staff and site/side marked as required.  Patient sedated: no, digital block  Vitals: Vital signs were monitored during sedation. Patient tolerance: Patient tolerated the procedure well with no immediate complications. Joint: L little finger DIP Reduction technique: traction      Medications Ordered in ED Medications  lidocaine (PF) (XYLOCAINE) 1 % injection 5 mL (5 mLs Other Given 11/27/19 1610)  Tdap (BOOSTRIX) injection 0.5 mL (0.5 mLs Intramuscular Given 11/27/19 1610)  HYDROmorphone (DILAUDID) injection 1 mg (1 mg Intramuscular Given 11/27/19 1610)  doxycycline (VIBRA-TABS) tablet 100 mg (100 mg Oral Given 11/27/19 1612)  sodium chloride 0.9 % bolus 1,000 mL (0 mLs Intravenous Stopped 11/27/19 1748)    ED Course  I have reviewed the triage vital signs and the nursing notes.  Pertinent labs & imaging results that were available during my care of the patient were reviewed by me and considered in my medical decision making (see chart for details).    MDM Rules/Calculators/A&P                          74yF with open dislocation PIP L little finger. NVI. Will irrigate, reduce, close and splint. Update tetanus. Hand surgery FU.  Prophylactic abx for a few days. BP soft and on eliquis but doesn't initially appear to have traumatic injury that would account for hypotension. Chronic pain issues  but no acute pain aside from L hand. Abdomen benign. Will give some IVF and observe. BP during cardiology office visit on 10/31/19 was 94/52. She says she normally runs in 90s/low 100s.  BP stable. She has no additional complaints. Suspect this BP her baseline.    Final Clinical Impression(s) / ED Diagnoses Final diagnoses:  Open dislocation of finger, initial encounter    Rx / DC Orders ED Discharge Orders    None       Virgel Manifold, MD 11/27/19 1751

## 2019-11-27 NOTE — Progress Notes (Signed)
Responded to level 2 fall.  That was cancelled.  Will follow as needed.  Jaclynn Major, [place], [facility], Pager [phone]

## 2019-11-27 NOTE — Progress Notes (Signed)
Orthopedic Tech Progress Note Patient Details:  [name] 1945/11/12 [id number] Patient was also level 2 trauma Ortho Devices Type of Ortho Device: Ulna gutter splint Ortho Device/Splint Location: LUE Ortho Device/Splint Interventions: Ordered, Application   Post Interventions Patient Tolerated: Well Instructions Provided: Care of device, Poper ambulation with device   Joel Cowin 11/27/2019, 6:37 PM

## 2019-11-27 NOTE — Discharge Instructions (Addendum)
Keep splint on and keep it dry until you can follow-up with hand surgery.

## 2019-11-28 DIAGNOSIS — I4891 Unspecified atrial fibrillation: Secondary | ICD-10-CM | POA: Diagnosis not present

## 2019-11-28 DIAGNOSIS — I480 Paroxysmal atrial fibrillation: Secondary | ICD-10-CM | POA: Diagnosis not present

## 2019-11-28 DIAGNOSIS — Z79899 Other long term (current) drug therapy: Secondary | ICD-10-CM | POA: Diagnosis not present

## 2019-11-28 DIAGNOSIS — I517 Cardiomegaly: Secondary | ICD-10-CM | POA: Diagnosis not present

## 2019-11-28 DIAGNOSIS — R7989 Other specified abnormal findings of blood chemistry: Secondary | ICD-10-CM | POA: Diagnosis not present

## 2019-12-02 DIAGNOSIS — R3911 Hesitancy of micturition: Secondary | ICD-10-CM | POA: Diagnosis not present

## 2019-12-02 DIAGNOSIS — M79642 Pain in left hand: Secondary | ICD-10-CM | POA: Diagnosis not present

## 2019-12-02 DIAGNOSIS — S92324A Nondisplaced fracture of second metatarsal bone, right foot, initial encounter for closed fracture: Secondary | ICD-10-CM | POA: Diagnosis not present

## 2019-12-02 DIAGNOSIS — M1990 Unspecified osteoarthritis, unspecified site: Secondary | ICD-10-CM | POA: Diagnosis not present

## 2019-12-02 DIAGNOSIS — N179 Acute kidney failure, unspecified: Secondary | ICD-10-CM | POA: Diagnosis not present

## 2019-12-02 DIAGNOSIS — S92344A Nondisplaced fracture of fourth metatarsal bone, right foot, initial encounter for closed fracture: Secondary | ICD-10-CM | POA: Diagnosis not present

## 2019-12-02 DIAGNOSIS — E1122 Type 2 diabetes mellitus with diabetic chronic kidney disease: Secondary | ICD-10-CM | POA: Diagnosis not present

## 2019-12-02 DIAGNOSIS — N1831 Chronic kidney disease, stage 3a: Secondary | ICD-10-CM | POA: Diagnosis not present

## 2019-12-02 DIAGNOSIS — W19XXXS Unspecified fall, sequela: Secondary | ICD-10-CM | POA: Diagnosis not present

## 2019-12-02 DIAGNOSIS — S63277A Dislocation of unspecified interphalangeal joint of left little finger, initial encounter: Secondary | ICD-10-CM | POA: Diagnosis not present

## 2019-12-02 DIAGNOSIS — S92334A Nondisplaced fracture of third metatarsal bone, right foot, initial encounter for closed fracture: Secondary | ICD-10-CM | POA: Diagnosis not present

## 2019-12-02 DIAGNOSIS — K220 Achalasia of cardia: Secondary | ICD-10-CM | POA: Diagnosis not present

## 2019-12-02 DIAGNOSIS — M79671 Pain in right foot: Secondary | ICD-10-CM | POA: Diagnosis not present

## 2019-12-05 ENCOUNTER — Other Ambulatory Visit: Payer: Self-pay | Admitting: Nephrology

## 2019-12-05 DIAGNOSIS — N1831 Chronic kidney disease, stage 3a: Secondary | ICD-10-CM

## 2019-12-08 ENCOUNTER — Other Ambulatory Visit: Payer: Self-pay | Admitting: Interventional Cardiology

## 2019-12-08 ENCOUNTER — Telehealth: Payer: Self-pay | Admitting: Interventional Cardiology

## 2019-12-08 DIAGNOSIS — S92334A Nondisplaced fracture of third metatarsal bone, right foot, initial encounter for closed fracture: Secondary | ICD-10-CM | POA: Diagnosis not present

## 2019-12-08 DIAGNOSIS — S92344A Nondisplaced fracture of fourth metatarsal bone, right foot, initial encounter for closed fracture: Secondary | ICD-10-CM | POA: Diagnosis not present

## 2019-12-08 DIAGNOSIS — S63277A Dislocation of unspecified interphalangeal joint of left little finger, initial encounter: Secondary | ICD-10-CM | POA: Diagnosis not present

## 2019-12-08 DIAGNOSIS — S92324A Nondisplaced fracture of second metatarsal bone, right foot, initial encounter for closed fracture: Secondary | ICD-10-CM | POA: Diagnosis not present

## 2019-12-08 DIAGNOSIS — I480 Paroxysmal atrial fibrillation: Secondary | ICD-10-CM

## 2019-12-08 MED ORDER — AMIODARONE HCL 200 MG PO TABS: 100.0000 mg | ORAL_TABLET | Freq: Every day | ORAL | 3 refills | Status: DC

## 2019-12-08 NOTE — Telephone Encounter (Signed)
Patient calling to give Anderson Malta some results from another doctor's office. Advised Anderson Malta is not in today and will be back tomorrow.

## 2019-12-08 NOTE — Telephone Encounter (Signed)
Left message for patient to call back  

## 2019-12-10 ENCOUNTER — Ambulatory Visit: Payer: Medicare Other

## 2019-12-10 NOTE — Telephone Encounter (Signed)
Back to 5 mg BID.

## 2019-12-10 NOTE — Telephone Encounter (Signed)
Spoke with pt and she states Creatinine on labs at Dr. Luis Abed office on 8/10 was 1.19.  On 8/6 it was 1.2.  Labs from 8/10 not available in Care Everywhere yet.  Pt would like to know which dose of Eliquis Dr. Tamala Julian wants her on.  Will send to Dr. Tamala Julian for review.

## 2019-12-10 NOTE — Telephone Encounter (Signed)
    Pt is returning call, she also would like to know if Dr. Tamala Julian have to increase her Eliquis.

## 2019-12-11 NOTE — Telephone Encounter (Signed)
Left message to call back  

## 2019-12-12 ENCOUNTER — Telehealth: Payer: Self-pay | Admitting: *Deleted

## 2019-12-12 NOTE — Telephone Encounter (Signed)
Spoke with pt and made her aware to increase Eliquis back to 5mg  BID. Pt verbalized understanding and was appreciative for call.

## 2019-12-12 NOTE — Telephone Encounter (Signed)
Our office received a fax today from [facility]. I called to verify if they were just requesting records at this time. I s/w Dr. Ronnald Ramp who confirmed at this point the anesthesiologist wants records to confirm cardiac history. Once anesthesiologist reviews pt's cardiac history they will reach if needing to hold any medications. I did let Dr. Ronnald Ramp know that the pt is on Eliquis. I assured Dr, Ronnald Ramp that I will bring this form to our HIM Dept and have last ov note and med list faxed to their office [phone]. Dr. Ronnald Ramp thanked me for the help.

## 2019-12-16 ENCOUNTER — Other Ambulatory Visit: Payer: Medicare Other

## 2019-12-16 DIAGNOSIS — S92324A Nondisplaced fracture of second metatarsal bone, right foot, initial encounter for closed fracture: Secondary | ICD-10-CM | POA: Diagnosis not present

## 2019-12-16 DIAGNOSIS — E1121 Type 2 diabetes mellitus with diabetic nephropathy: Secondary | ICD-10-CM | POA: Diagnosis not present

## 2019-12-16 DIAGNOSIS — N1831 Chronic kidney disease, stage 3a: Secondary | ICD-10-CM | POA: Diagnosis not present

## 2019-12-16 DIAGNOSIS — S92334A Nondisplaced fracture of third metatarsal bone, right foot, initial encounter for closed fracture: Secondary | ICD-10-CM | POA: Diagnosis not present

## 2019-12-16 DIAGNOSIS — I480 Paroxysmal atrial fibrillation: Secondary | ICD-10-CM | POA: Diagnosis not present

## 2019-12-16 DIAGNOSIS — E1165 Type 2 diabetes mellitus with hyperglycemia: Secondary | ICD-10-CM | POA: Diagnosis not present

## 2019-12-16 DIAGNOSIS — M85852 Other specified disorders of bone density and structure, left thigh: Secondary | ICD-10-CM | POA: Diagnosis not present

## 2019-12-16 DIAGNOSIS — S92344A Nondisplaced fracture of fourth metatarsal bone, right foot, initial encounter for closed fracture: Secondary | ICD-10-CM | POA: Diagnosis not present

## 2019-12-16 DIAGNOSIS — M85851 Other specified disorders of bone density and structure, right thigh: Secondary | ICD-10-CM | POA: Diagnosis not present

## 2019-12-16 DIAGNOSIS — E1142 Type 2 diabetes mellitus with diabetic polyneuropathy: Secondary | ICD-10-CM | POA: Diagnosis not present

## 2019-12-16 DIAGNOSIS — M13842 Other specified arthritis, left hand: Secondary | ICD-10-CM | POA: Diagnosis not present

## 2019-12-16 DIAGNOSIS — Z794 Long term (current) use of insulin: Secondary | ICD-10-CM | POA: Diagnosis not present

## 2019-12-17 ENCOUNTER — Other Ambulatory Visit: Payer: Self-pay | Admitting: Interventional Cardiology

## 2019-12-17 DIAGNOSIS — S61217A Laceration without foreign body of left little finger without damage to nail, initial encounter: Secondary | ICD-10-CM | POA: Diagnosis not present

## 2019-12-17 DIAGNOSIS — M79642 Pain in left hand: Secondary | ICD-10-CM | POA: Diagnosis not present

## 2019-12-17 DIAGNOSIS — S63287A Dislocation of proximal interphalangeal joint of left little finger, initial encounter: Secondary | ICD-10-CM | POA: Diagnosis not present

## 2019-12-17 NOTE — Telephone Encounter (Signed)
Eliquis 5mg  refill request received. Patient is 74 years old, weight-95.7kg, Crea-2.34 on 11/27/2019 & CrCl-32.14ml/min, Diagnosis-Afib, and last seen by Dr. Tamala Julian on 10/31/2019. Dose is appropriate based on dosing criteria. Will send in refill to requested pharmacy.

## 2019-12-19 ENCOUNTER — Other Ambulatory Visit: Payer: Self-pay | Admitting: Geriatric Medicine

## 2019-12-19 DIAGNOSIS — R5381 Other malaise: Secondary | ICD-10-CM

## 2019-12-23 ENCOUNTER — Telehealth: Payer: Self-pay | Admitting: Interventional Cardiology

## 2019-12-23 ENCOUNTER — Other Ambulatory Visit: Payer: Self-pay | Admitting: Geriatric Medicine

## 2019-12-23 DIAGNOSIS — E2839 Other primary ovarian failure: Secondary | ICD-10-CM

## 2019-12-23 NOTE — Telephone Encounter (Signed)
Patient with diagnosis of afib on Eliquis for anticoagulation.    Procedure: EXPLORATION IT SMALL FINGER WITH REPAIR RECONSTRUCTION  Date of procedure: 12/26/19  CHADS2-VASc score of  4 (HTN, AGE, DM2, female)  CrCl 49ml/min  Per office protocol, patient can hold Eliquis for 1-2 days prior to procedure.

## 2019-12-23 NOTE — Telephone Encounter (Signed)
Left message for [name], surgery scheduler clearance has been faxed to their office about 30 + minutes ago. Called to go over recommendations in regards to Eliquis since pt's surgery is 12/26/19. Ok to hold Eliquis 1-2 days prior to procedure. I will re-fax these notes as I did leave message for surgery scheduler.

## 2019-12-23 NOTE — Telephone Encounter (Signed)
   Primary Cardiologist: Sinclair Grooms, MD  Chart reviewed as part of pre-operative protocol coverage. Given past medical history and time since last visit and conversation with patient earlier, based on ACC/AHA guidelines, [name] would be at acceptable risk for the planned procedure without further cardiovascular testing.   The patient was advised that if she develops new symptoms prior to surgery to contact our office to arrange for a follow-up visit, and she verbalized understanding.  Per office protocol, patient can hold Eliquis for 1-2 days prior to procedure.  I will route this recommendation to the requesting party via Epic fax function and remove from pre-op pool. Given time sensitive nature, will also route to callback to call requesting surgeon's office to be aware of recommendation so they can finalize their instructions to the patient. Please call with questions.  Charlie Pitter, PA-C 12/23/2019, 12:46 PM

## 2019-12-23 NOTE — Telephone Encounter (Signed)
   [facility] Pre-operative Risk Assessment    HEARTCARE STAFF: - Please ensure there is not already an duplicate clearance open for this procedure. - Under Visit Info/Reason for Call, type in Other and utilize the format Clearance MM/DD/YY or Clearance TBD. Do not use dashes or single digits. - If request is for dental extraction, please clarify the # of teeth to be extracted.  Request for surgical clearance:  1. What type of surgery is being performed? EXPLORATION IT SMALL FINGER WITH REPAIR RECONSTRUCTION   2. When is this surgery scheduled? 12/26/19   3. What type of clearance is required (medical clearance vs. Pharmacy clearance to hold med vs. Both)? BOTH  4. Are there any medications that need to be held prior to surgery and how long? ELIQUIS x 24 HOURS PRIOR   5. Practice name and name of physician performing surgery? [facility]; DR.WILLIAM GRAMIG   6. What is the office phone number? [phone]   7.   What is the office fax number? [phone]  8.   Anesthesia type (None, local, MAC, general) ? BLOCK WITH IV SED   Julaine Hua 12/23/2019, 10:14 AM  _________________________________________________________________   (provider comments below)

## 2019-12-23 NOTE — Telephone Encounter (Signed)
   Primary Cardiologist: Sinclair Grooms, MD  Chart reviewed as part of pre-operative protocol coverage. Patient was contacted 12/23/2019 in reference to pre-operative risk assessment for pending surgery as outlined below.  [name] was last seen on 10/31/19 by Dr. Tamala Julian. She has history of PAF, DM, MVP, HLD, fibromyalgia, GERD and OSA. RCRI 0.4% indicating low risk of CV complications. I spoke with patient who affirms she has been doing well without any interim cardiac symptoms. Therefore, based on ACC/AHA guidelines, [name] would be at acceptable risk for the planned procedure without further cardiovascular testing. She does report potentially needing some teeth pulled in the near future. This conversation will also provide medical clearance for that procedure as well (she will reach out to her dentist to send [place] a formal request if needed outlining the actual procedure at which time anticoag will need to be clarified separately).  Will route to pharm for input on Eliquis then plan to bundle onward.  Charlie Pitter, PA-C 12/23/2019, 10:21 AM

## 2019-12-23 NOTE — Telephone Encounter (Signed)
Sherry from [facility] called. She states that they are faxing over a surgical clearance for immediate surgery. They are wanting to schedule her for this Friday so they wanted to give [place] a heads up to be on the lookout for it.

## 2019-12-25 ENCOUNTER — Other Ambulatory Visit: Payer: Self-pay

## 2019-12-25 ENCOUNTER — Ambulatory Visit
Admission: RE | Admit: 2019-12-25 | Discharge: 2019-12-25 | Disposition: A | Discharge status: Home / Self Care | Payer: Medicare Other | Source: Ambulatory Visit | Attending: Nephrology | Admitting: Nephrology

## 2019-12-25 ENCOUNTER — Ambulatory Visit
Admission: RE | Admit: 2019-12-25 | Discharge: 2019-12-25 | Disposition: A | Discharge status: Home / Self Care | Payer: Medicare Other | Source: Ambulatory Visit | Attending: Geriatric Medicine | Admitting: Geriatric Medicine

## 2019-12-25 DIAGNOSIS — N261 Atrophy of kidney (terminal): Secondary | ICD-10-CM | POA: Diagnosis not present

## 2019-12-25 DIAGNOSIS — Z1231 Encounter for screening mammogram for malignant neoplasm of breast: Secondary | ICD-10-CM | POA: Diagnosis not present

## 2019-12-25 DIAGNOSIS — F411 Generalized anxiety disorder: Secondary | ICD-10-CM | POA: Diagnosis not present

## 2019-12-25 DIAGNOSIS — F330 Major depressive disorder, recurrent, mild: Secondary | ICD-10-CM | POA: Diagnosis not present

## 2019-12-25 DIAGNOSIS — N183 Chronic kidney disease, stage 3 unspecified: Secondary | ICD-10-CM | POA: Diagnosis not present

## 2019-12-25 DIAGNOSIS — N1831 Chronic kidney disease, stage 3a: Secondary | ICD-10-CM

## 2019-12-30 ENCOUNTER — Other Ambulatory Visit: Payer: Self-pay

## 2019-12-30 DIAGNOSIS — M67844 Other specified disorders of tendon, left hand: Secondary | ICD-10-CM | POA: Diagnosis not present

## 2019-12-30 DIAGNOSIS — E7800 Pure hypercholesterolemia, unspecified: Secondary | ICD-10-CM | POA: Diagnosis not present

## 2019-12-30 DIAGNOSIS — I480 Paroxysmal atrial fibrillation: Secondary | ICD-10-CM | POA: Diagnosis not present

## 2019-12-30 DIAGNOSIS — X58XXXA Exposure to other specified factors, initial encounter: Secondary | ICD-10-CM | POA: Diagnosis not present

## 2019-12-30 DIAGNOSIS — Y999 Unspecified external cause status: Secondary | ICD-10-CM | POA: Diagnosis not present

## 2019-12-30 DIAGNOSIS — S66197A Other injury of flexor muscle, fascia and tendon of left little finger at wrist and hand level, initial encounter: Secondary | ICD-10-CM | POA: Diagnosis not present

## 2019-12-30 DIAGNOSIS — F331 Major depressive disorder, recurrent, moderate: Secondary | ICD-10-CM | POA: Diagnosis not present

## 2019-12-30 DIAGNOSIS — S63297A Dislocation of distal interphalangeal joint of left little finger, initial encounter: Secondary | ICD-10-CM | POA: Diagnosis not present

## 2019-12-30 DIAGNOSIS — G8918 Other acute postprocedural pain: Secondary | ICD-10-CM | POA: Diagnosis not present

## 2019-12-30 DIAGNOSIS — N1831 Chronic kidney disease, stage 3a: Secondary | ICD-10-CM | POA: Diagnosis not present

## 2019-12-30 DIAGNOSIS — E1142 Type 2 diabetes mellitus with diabetic polyneuropathy: Secondary | ICD-10-CM | POA: Diagnosis not present

## 2019-12-30 DIAGNOSIS — E1121 Type 2 diabetes mellitus with diabetic nephropathy: Secondary | ICD-10-CM | POA: Diagnosis not present

## 2019-12-30 DIAGNOSIS — S56196A Other injury of flexor muscle, fascia and tendon of left ring finger at forearm level, initial encounter: Secondary | ICD-10-CM | POA: Diagnosis not present

## 2019-12-30 MED ORDER — FUROSEMIDE 20 MG PO TABS: 10.0000 mg | ORAL_TABLET | Freq: Every day | ORAL | 3 refills | Status: DC | PRN

## 2019-12-31 ENCOUNTER — Telehealth: Payer: Self-pay | Admitting: Interventional Cardiology

## 2019-12-31 NOTE — Telephone Encounter (Signed)
New message:      patient calling and need her test results sent [phone]

## 2019-12-31 NOTE — Telephone Encounter (Signed)
Left message for pt to call back.  Need to clarify what she is needing sent and who this number belongs to.

## 2020-01-01 ENCOUNTER — Ambulatory Visit: Payer: Medicare Other

## 2020-01-05 NOTE — Telephone Encounter (Signed)
Left message to call back  

## 2020-01-08 NOTE — Telephone Encounter (Signed)
Left message for pt to call back if she still needed information sent to the number previously provided.  Advised I needed to know what she needed sent and who the number belonged to.

## 2020-01-12 DIAGNOSIS — S92334D Nondisplaced fracture of third metatarsal bone, right foot, subsequent encounter for fracture with routine healing: Secondary | ICD-10-CM | POA: Diagnosis not present

## 2020-01-12 DIAGNOSIS — S92344A Nondisplaced fracture of fourth metatarsal bone, right foot, initial encounter for closed fracture: Secondary | ICD-10-CM | POA: Diagnosis not present

## 2020-01-12 DIAGNOSIS — M25642 Stiffness of left hand, not elsewhere classified: Secondary | ICD-10-CM | POA: Diagnosis not present

## 2020-01-12 DIAGNOSIS — S92324D Nondisplaced fracture of second metatarsal bone, right foot, subsequent encounter for fracture with routine healing: Secondary | ICD-10-CM | POA: Diagnosis not present

## 2020-01-12 NOTE — Telephone Encounter (Signed)
Spoke with pt and she states she actually now has a form from the office that wants records.  It states everything they are looking for.  Provided our fax number and she will send over to our medical records dept.

## 2020-01-19 ENCOUNTER — Telehealth: Payer: Self-pay | Admitting: *Deleted

## 2020-01-19 DIAGNOSIS — Z20822 Contact with and (suspected) exposure to covid-19: Secondary | ICD-10-CM | POA: Diagnosis not present

## 2020-01-19 NOTE — Telephone Encounter (Signed)
   [facility] Pre-operative Risk Assessment    HEARTCARE STAFF: - Please ensure there is not already an duplicate clearance open for this procedure. - Under Visit Info/Reason for Call, type in Other and utilize the format Clearance MM/DD/YY or Clearance TBD. Do not use dashes or single digits. - If request is for dental extraction, please clarify the # of teeth to be extracted.  Request for surgical clearance:  1. What type of surgery is being performed? PT WILL BE HAVING REMAINING TEETH REMOVED. NURSE COULD NOT CONFIRM HOW MANY TEETH , HOWEVER I DID CONFIRM IT IS MORE THAN 2 TEETH. PT WILL ALSO BE HAVING MAXILLARY RECONSTRUCTION WITH IMPLANT  2. When is this surgery scheduled? TBD   3. What type of clearance is required (medical clearance vs. Pharmacy clearance to hold med vs. Both)? BOTH  4. Are there any medications that need to be held prior to surgery and how long? ELIQUIS   5. Practice name and name of physician performing surgery? [facility]; DR. York Ram REEBYE  6. What is the office phone number? [phone]   7.   What is the office fax number? [phone]  8.   Anesthesia type (None, local, MAC, general) ? GENERAL   Julaine Hua 01/19/2020, 9:46 AM  _________________________________________________________________   (provider comments below)

## 2020-01-19 NOTE — Telephone Encounter (Signed)
Please inform the patient to hold Eliquis for 2 days prior to dental procedure. 

## 2020-01-19 NOTE — Telephone Encounter (Signed)
Patient with diagnosis of atrial fibrillation on Eliquis for anticoagulation.    Procedure: maxillary reconstruction with implant and tooth extraction (at least 2 teeth) Date of procedure: TBD  CHADS2-VASc score of 4 (HTN, DM, AGE, female)  CrCl 45 Platelet count 362  Per office protocol, patient can hold Eliquis for 2 days prior to procedure.    Patient does not require SBE prophylaxis.

## 2020-01-19 NOTE — Telephone Encounter (Signed)
Left message with instructions for the pt she will need to hold her Eliquis x 2 days prior to her dental procedure. I will fax clearance and recommendations to requesting office. I will remove from the pre op call back pool.

## 2020-01-19 NOTE — Telephone Encounter (Signed)
   Primary Cardiologist: Sinclair Grooms, MD  Chart reviewed as part of pre-operative protocol coverage. Given past medical history and time since last visit, based on ACC/AHA guidelines, [name] would be at acceptable risk for the planned procedure without further cardiovascular testing. Patient was previously cleared for dental procedure last month during preop clearance for another procedure.   The patient was advised that if she develops new symptoms prior to surgery to contact our office to arrange for a follow-up visit, and she verbalized understanding.  I will route this recommendation to the requesting party via Epic fax function and remove from pre-op pool.  Please call with questions.   Pending recommendation by our clinical pharmacist regarding [place], [place] 01/19/2020, 10:28 AM

## 2020-01-19 NOTE — Telephone Encounter (Signed)
Clinical pharmacist to review Eliquis 

## 2020-01-27 DIAGNOSIS — M25642 Stiffness of left hand, not elsewhere classified: Secondary | ICD-10-CM | POA: Diagnosis not present

## 2020-02-04 DIAGNOSIS — M25642 Stiffness of left hand, not elsewhere classified: Secondary | ICD-10-CM | POA: Diagnosis not present

## 2020-02-06 DIAGNOSIS — H2511 Age-related nuclear cataract, right eye: Secondary | ICD-10-CM | POA: Diagnosis not present

## 2020-02-09 DIAGNOSIS — I480 Paroxysmal atrial fibrillation: Secondary | ICD-10-CM | POA: Diagnosis not present

## 2020-02-09 DIAGNOSIS — Z794 Long term (current) use of insulin: Secondary | ICD-10-CM | POA: Diagnosis not present

## 2020-02-09 DIAGNOSIS — N1831 Chronic kidney disease, stage 3a: Secondary | ICD-10-CM | POA: Diagnosis not present

## 2020-02-09 DIAGNOSIS — E1142 Type 2 diabetes mellitus with diabetic polyneuropathy: Secondary | ICD-10-CM | POA: Diagnosis not present

## 2020-02-09 DIAGNOSIS — E1121 Type 2 diabetes mellitus with diabetic nephropathy: Secondary | ICD-10-CM | POA: Diagnosis not present

## 2020-02-09 DIAGNOSIS — Z23 Encounter for immunization: Secondary | ICD-10-CM | POA: Diagnosis not present

## 2020-02-09 DIAGNOSIS — Z4789 Encounter for other orthopedic aftercare: Secondary | ICD-10-CM | POA: Diagnosis not present

## 2020-02-09 DIAGNOSIS — G5601 Carpal tunnel syndrome, right upper limb: Secondary | ICD-10-CM | POA: Diagnosis not present

## 2020-02-09 DIAGNOSIS — D6869 Other thrombophilia: Secondary | ICD-10-CM | POA: Diagnosis not present

## 2020-02-18 DIAGNOSIS — M25642 Stiffness of left hand, not elsewhere classified: Secondary | ICD-10-CM | POA: Diagnosis not present

## 2020-02-23 DIAGNOSIS — M25642 Stiffness of left hand, not elsewhere classified: Secondary | ICD-10-CM | POA: Diagnosis not present

## 2020-03-01 ENCOUNTER — Ambulatory Visit: Payer: Medicare Other | Admitting: Gastroenterology

## 2020-03-02 DIAGNOSIS — F331 Major depressive disorder, recurrent, moderate: Secondary | ICD-10-CM | POA: Diagnosis not present

## 2020-03-02 DIAGNOSIS — E7800 Pure hypercholesterolemia, unspecified: Secondary | ICD-10-CM | POA: Diagnosis not present

## 2020-03-02 DIAGNOSIS — E1142 Type 2 diabetes mellitus with diabetic polyneuropathy: Secondary | ICD-10-CM | POA: Diagnosis not present

## 2020-03-02 DIAGNOSIS — I480 Paroxysmal atrial fibrillation: Secondary | ICD-10-CM | POA: Diagnosis not present

## 2020-03-02 DIAGNOSIS — N1831 Chronic kidney disease, stage 3a: Secondary | ICD-10-CM | POA: Diagnosis not present

## 2020-03-02 DIAGNOSIS — E1121 Type 2 diabetes mellitus with diabetic nephropathy: Secondary | ICD-10-CM | POA: Diagnosis not present

## 2020-03-05 DIAGNOSIS — M25642 Stiffness of left hand, not elsewhere classified: Secondary | ICD-10-CM | POA: Diagnosis not present

## 2020-03-11 DIAGNOSIS — E1142 Type 2 diabetes mellitus with diabetic polyneuropathy: Secondary | ICD-10-CM | POA: Diagnosis not present

## 2020-03-11 DIAGNOSIS — E1165 Type 2 diabetes mellitus with hyperglycemia: Secondary | ICD-10-CM | POA: Diagnosis not present

## 2020-03-11 DIAGNOSIS — M546 Pain in thoracic spine: Secondary | ICD-10-CM | POA: Diagnosis not present

## 2020-03-11 DIAGNOSIS — Z794 Long term (current) use of insulin: Secondary | ICD-10-CM | POA: Diagnosis not present

## 2020-03-23 DIAGNOSIS — M25642 Stiffness of left hand, not elsewhere classified: Secondary | ICD-10-CM | POA: Diagnosis not present

## 2020-03-24 DIAGNOSIS — M25511 Pain in right shoulder: Secondary | ICD-10-CM | POA: Diagnosis not present

## 2020-03-30 ENCOUNTER — Other Ambulatory Visit: Payer: Medicare Other

## 2020-04-05 DIAGNOSIS — G4733 Obstructive sleep apnea (adult) (pediatric): Secondary | ICD-10-CM | POA: Diagnosis not present

## 2020-04-05 DIAGNOSIS — F419 Anxiety disorder, unspecified: Secondary | ICD-10-CM | POA: Diagnosis not present

## 2020-04-05 DIAGNOSIS — Z79899 Other long term (current) drug therapy: Secondary | ICD-10-CM | POA: Diagnosis not present

## 2020-04-05 DIAGNOSIS — N183 Chronic kidney disease, stage 3 unspecified: Secondary | ICD-10-CM | POA: Diagnosis not present

## 2020-04-05 DIAGNOSIS — Z885 Allergy status to narcotic agent status: Secondary | ICD-10-CM | POA: Diagnosis not present

## 2020-04-05 DIAGNOSIS — I4891 Unspecified atrial fibrillation: Secondary | ICD-10-CM | POA: Diagnosis not present

## 2020-04-05 DIAGNOSIS — Z9849 Cataract extraction status, unspecified eye: Secondary | ICD-10-CM | POA: Diagnosis not present

## 2020-04-05 DIAGNOSIS — E1136 Type 2 diabetes mellitus with diabetic cataract: Secondary | ICD-10-CM | POA: Diagnosis not present

## 2020-04-05 DIAGNOSIS — Z91040 Latex allergy status: Secondary | ICD-10-CM | POA: Diagnosis not present

## 2020-04-05 DIAGNOSIS — I129 Hypertensive chronic kidney disease with stage 1 through stage 4 chronic kidney disease, or unspecified chronic kidney disease: Secondary | ICD-10-CM | POA: Diagnosis not present

## 2020-04-05 DIAGNOSIS — Z880 Allergy status to penicillin: Secondary | ICD-10-CM | POA: Diagnosis not present

## 2020-04-05 DIAGNOSIS — E785 Hyperlipidemia, unspecified: Secondary | ICD-10-CM | POA: Diagnosis not present

## 2020-04-05 DIAGNOSIS — H2511 Age-related nuclear cataract, right eye: Secondary | ICD-10-CM | POA: Diagnosis not present

## 2020-04-05 DIAGNOSIS — Z9049 Acquired absence of other specified parts of digestive tract: Secondary | ICD-10-CM | POA: Diagnosis not present

## 2020-04-05 DIAGNOSIS — I4892 Unspecified atrial flutter: Secondary | ICD-10-CM | POA: Diagnosis not present

## 2020-04-05 DIAGNOSIS — E1122 Type 2 diabetes mellitus with diabetic chronic kidney disease: Secondary | ICD-10-CM | POA: Diagnosis not present

## 2020-04-05 DIAGNOSIS — Z7901 Long term (current) use of anticoagulants: Secondary | ICD-10-CM | POA: Diagnosis not present

## 2020-04-05 DIAGNOSIS — Z794 Long term (current) use of insulin: Secondary | ICD-10-CM | POA: Diagnosis not present

## 2020-04-22 DIAGNOSIS — E1142 Type 2 diabetes mellitus with diabetic polyneuropathy: Secondary | ICD-10-CM | POA: Diagnosis not present

## 2020-04-22 DIAGNOSIS — I480 Paroxysmal atrial fibrillation: Secondary | ICD-10-CM | POA: Diagnosis not present

## 2020-04-22 DIAGNOSIS — E1121 Type 2 diabetes mellitus with diabetic nephropathy: Secondary | ICD-10-CM | POA: Diagnosis not present

## 2020-04-22 DIAGNOSIS — F331 Major depressive disorder, recurrent, moderate: Secondary | ICD-10-CM | POA: Diagnosis not present

## 2020-04-22 DIAGNOSIS — E7800 Pure hypercholesterolemia, unspecified: Secondary | ICD-10-CM | POA: Diagnosis not present

## 2020-04-22 DIAGNOSIS — N1831 Chronic kidney disease, stage 3a: Secondary | ICD-10-CM | POA: Diagnosis not present

## 2020-05-19 DIAGNOSIS — E1142 Type 2 diabetes mellitus with diabetic polyneuropathy: Secondary | ICD-10-CM | POA: Diagnosis not present

## 2020-05-19 DIAGNOSIS — N1831 Chronic kidney disease, stage 3a: Secondary | ICD-10-CM | POA: Diagnosis not present

## 2020-05-19 DIAGNOSIS — E1121 Type 2 diabetes mellitus with diabetic nephropathy: Secondary | ICD-10-CM | POA: Diagnosis not present

## 2020-05-19 DIAGNOSIS — F331 Major depressive disorder, recurrent, moderate: Secondary | ICD-10-CM | POA: Diagnosis not present

## 2020-05-19 DIAGNOSIS — I480 Paroxysmal atrial fibrillation: Secondary | ICD-10-CM | POA: Diagnosis not present

## 2020-05-19 DIAGNOSIS — E7800 Pure hypercholesterolemia, unspecified: Secondary | ICD-10-CM | POA: Diagnosis not present

## 2020-06-18 DIAGNOSIS — E1142 Type 2 diabetes mellitus with diabetic polyneuropathy: Secondary | ICD-10-CM | POA: Diagnosis not present

## 2020-06-18 DIAGNOSIS — N1831 Chronic kidney disease, stage 3a: Secondary | ICD-10-CM | POA: Diagnosis not present

## 2020-06-18 DIAGNOSIS — I480 Paroxysmal atrial fibrillation: Secondary | ICD-10-CM | POA: Diagnosis not present

## 2020-06-18 DIAGNOSIS — E1121 Type 2 diabetes mellitus with diabetic nephropathy: Secondary | ICD-10-CM | POA: Diagnosis not present

## 2020-06-18 DIAGNOSIS — F331 Major depressive disorder, recurrent, moderate: Secondary | ICD-10-CM | POA: Diagnosis not present

## 2020-06-18 DIAGNOSIS — E7800 Pure hypercholesterolemia, unspecified: Secondary | ICD-10-CM | POA: Diagnosis not present

## 2020-06-21 DIAGNOSIS — M797 Fibromyalgia: Secondary | ICD-10-CM | POA: Diagnosis not present

## 2020-06-21 DIAGNOSIS — F331 Major depressive disorder, recurrent, moderate: Secondary | ICD-10-CM | POA: Diagnosis not present

## 2020-06-21 DIAGNOSIS — E1142 Type 2 diabetes mellitus with diabetic polyneuropathy: Secondary | ICD-10-CM | POA: Diagnosis not present

## 2020-06-21 DIAGNOSIS — E7800 Pure hypercholesterolemia, unspecified: Secondary | ICD-10-CM | POA: Diagnosis not present

## 2020-06-21 DIAGNOSIS — N1831 Chronic kidney disease, stage 3a: Secondary | ICD-10-CM | POA: Diagnosis not present

## 2020-06-21 DIAGNOSIS — I480 Paroxysmal atrial fibrillation: Secondary | ICD-10-CM | POA: Diagnosis not present

## 2020-06-21 DIAGNOSIS — E1121 Type 2 diabetes mellitus with diabetic nephropathy: Secondary | ICD-10-CM | POA: Diagnosis not present

## 2020-06-21 DIAGNOSIS — D6869 Other thrombophilia: Secondary | ICD-10-CM | POA: Diagnosis not present

## 2020-06-21 DIAGNOSIS — Z79899 Other long term (current) drug therapy: Secondary | ICD-10-CM | POA: Diagnosis not present

## 2020-06-21 DIAGNOSIS — R5382 Chronic fatigue, unspecified: Secondary | ICD-10-CM | POA: Diagnosis not present

## 2020-06-21 DIAGNOSIS — Z0000 Encounter for general adult medical examination without abnormal findings: Secondary | ICD-10-CM | POA: Diagnosis not present

## 2020-06-21 DIAGNOSIS — L719 Rosacea, unspecified: Secondary | ICD-10-CM | POA: Diagnosis not present

## 2020-06-21 DIAGNOSIS — Z794 Long term (current) use of insulin: Secondary | ICD-10-CM | POA: Diagnosis not present

## 2020-06-30 DIAGNOSIS — F411 Generalized anxiety disorder: Secondary | ICD-10-CM | POA: Diagnosis not present

## 2020-07-07 ENCOUNTER — Other Ambulatory Visit: Payer: Medicare Other

## 2020-07-07 DIAGNOSIS — R000 Tachycardia, unspecified: Secondary | ICD-10-CM | POA: Diagnosis not present

## 2020-07-07 DIAGNOSIS — E119 Type 2 diabetes mellitus without complications: Secondary | ICD-10-CM | POA: Diagnosis not present

## 2020-07-07 DIAGNOSIS — G933 Postviral fatigue syndrome: Secondary | ICD-10-CM | POA: Diagnosis not present

## 2020-07-07 DIAGNOSIS — E032 Hypothyroidism due to medicaments and other exogenous substances: Secondary | ICD-10-CM | POA: Diagnosis not present

## 2020-07-14 DIAGNOSIS — E1122 Type 2 diabetes mellitus with diabetic chronic kidney disease: Secondary | ICD-10-CM | POA: Diagnosis not present

## 2020-07-14 DIAGNOSIS — K222 Esophageal obstruction: Secondary | ICD-10-CM | POA: Diagnosis not present

## 2020-07-14 DIAGNOSIS — E11649 Type 2 diabetes mellitus with hypoglycemia without coma: Secondary | ICD-10-CM | POA: Diagnosis not present

## 2020-07-14 DIAGNOSIS — N1831 Chronic kidney disease, stage 3a: Secondary | ICD-10-CM | POA: Diagnosis not present

## 2020-07-14 DIAGNOSIS — M1990 Unspecified osteoarthritis, unspecified site: Secondary | ICD-10-CM | POA: Diagnosis not present

## 2020-07-14 DIAGNOSIS — R5382 Chronic fatigue, unspecified: Secondary | ICD-10-CM | POA: Diagnosis not present

## 2020-07-14 DIAGNOSIS — G4733 Obstructive sleep apnea (adult) (pediatric): Secondary | ICD-10-CM | POA: Diagnosis not present

## 2020-07-14 DIAGNOSIS — F419 Anxiety disorder, unspecified: Secondary | ICD-10-CM | POA: Diagnosis not present

## 2020-07-14 DIAGNOSIS — E559 Vitamin D deficiency, unspecified: Secondary | ICD-10-CM | POA: Diagnosis not present

## 2020-07-14 DIAGNOSIS — I129 Hypertensive chronic kidney disease with stage 1 through stage 4 chronic kidney disease, or unspecified chronic kidney disease: Secondary | ICD-10-CM | POA: Diagnosis not present

## 2020-07-14 DIAGNOSIS — N189 Chronic kidney disease, unspecified: Secondary | ICD-10-CM | POA: Diagnosis not present

## 2020-07-14 DIAGNOSIS — Z7901 Long term (current) use of anticoagulants: Secondary | ICD-10-CM | POA: Diagnosis not present

## 2020-07-14 DIAGNOSIS — Z9989 Dependence on other enabling machines and devices: Secondary | ICD-10-CM | POA: Diagnosis not present

## 2020-07-14 DIAGNOSIS — K220 Achalasia of cardia: Secondary | ICD-10-CM | POA: Diagnosis not present

## 2020-07-14 DIAGNOSIS — I4891 Unspecified atrial fibrillation: Secondary | ICD-10-CM | POA: Diagnosis not present

## 2020-07-14 DIAGNOSIS — R3911 Hesitancy of micturition: Secondary | ICD-10-CM | POA: Diagnosis not present

## 2020-07-14 DIAGNOSIS — N179 Acute kidney failure, unspecified: Secondary | ICD-10-CM | POA: Diagnosis not present

## 2020-07-14 DIAGNOSIS — E785 Hyperlipidemia, unspecified: Secondary | ICD-10-CM | POA: Diagnosis not present

## 2020-07-20 ENCOUNTER — Ambulatory Visit: Payer: Medicare Other | Admitting: Physician Assistant

## 2020-07-20 NOTE — Progress Notes (Deleted)
Cardiology Office Note:    Date:  07/20/2020   ID:  SHENEA GIACOBBE, DOB Mar 21, 1946, MRN [id number]  PCP:  Lajean Manes, [place]  Cardiologist:  Sinclair Grooms, MD *** Advanced Practice Provider:  No care team member to display Electrophysiologist:  None  {Press F2 to show EP APP, CHF, sleep or structural heart MD               :[id number]}  { Click here to update then REFRESH NOTE - MD (PCP) or APP (Team Member)  Change PCP Type for MD, Specialty for APP is either Cardiology or Clinical Cardiac Electrophysiology  :[id number]}   Referring MD: Lajean Manes, MD   Chief Complaint:  No chief complaint on file.    Patient Profile:    [name] is a 75 y.o. female with:  Paroxysmal atrial fibrillation  Amiodarone Rx Diabetes mellitus  MVP  Hypertension  Hyperlipidemia  Chronic kidney disease  GERD  OSA Fibromyalgia   Prior CV studies: *** Myoview 02/16/16  Nuclear stress EF: 64%.  There was no ST segment deviation noted during stress.  There is a small defect of mild severity present in the apex location. The defect is non-reversible. This is most consistent with diaphragmatic attenuation artifact. No ischemia noted.  This is a low risk study.  The left ventricular ejection fraction is normal (55-65%).  Echocardiogram 02/10/16 - Left ventricle: The cavity size was normal. Wall thickness was  normal. Systolic function was normal. The estimated ejection  fraction was in the range of 60% to 65%. Wall motion was normal;  there were no regional wall motion abnormalities. Left  ventricular diastolic function parameters were normal.  - Left atrium: The atrium was mildly dilated.  - Atrial septum: No defect or patent foramen ovale was identified.  - Impressions: Normal GLS -17.9.      History of Present Illness:    Ms. Kocak was last seen by Dr. Tamala Julian in 7/21.  Her SCr was > 2 back then and her Apixaban was reduced to  2.5 mg.  Since then her SCr has improved and she is back on 5 mg now.  She returns for f/u.  ***         Past Medical History:  Diagnosis Date  . Cataract   . Complication of anesthesia 1975   projectile vomitting  . Diabetes mellitus   . Dyslipidemia   . Dysrhythmia    hx. A.Fib-Cardioversion-converted rhythm  . Fibromyalgia   . GERD (gastroesophageal reflux disease)   . Hypertension   . MVP (mitral valve prolapse)   . OSA (obstructive sleep apnea) 10-18-12   no cpap used now.  [name] PONV (postoperative nausea and vomiting)   . Vitamin D deficiency 10-18-12    Current Medications: No outpatient medications have been marked as taking for the 07/20/20 encounter (Appointment) with Richardson Dopp T, PA-C.     Allergies:   Iohexol, Morphine and related, Penicillins, Shellfish allergy, Demerol, Victoza [liraglutide], Dabigatran etexilate mesylate, and Latex   Social History   Tobacco Use  . Smoking status: Never Smoker  . Smokeless tobacco: Never Used  Vaping Use  . Vaping Use: Never used  Substance Use Topics  . Alcohol use: No  . Drug use: No     Family Hx: The patient's family history includes Atrial fibrillation in her father; Breast cancer in her maternal aunt; Bronchitis in her father; Diabetes type II in her father; Heart  disease in her father; Hypertension in her mother; Hyperthyroidism in her mother; Kidney cancer in her sister.  ROS   EKGs/Labs/Other Test Reviewed:    EKG:  EKG is *** ordered today.  The ekg ordered today demonstrates ***  Recent Labs: 10/31/2019: ALT 27; NT-Pro BNP 172; TSH 5.380 11/27/2019: BUN 36; Creatinine, Ser 2.34; Hemoglobin 13.7; Platelets 277; Potassium 4.4; Sodium 140   Recent Lipid Panel No results found for: CHOL, TRIG, HDL, CHOLHDL, LDLCALC, LDLDIRECT  Labs obtained through [place] - personally reviewed and interpreted: 07/14/2020: K 3.9, SCr 1.40, BUN 27, ALT 27, Hgb 14.2   Risk Assessment/Calculations:   {Does this patient  have ATRIAL FIBRILLATION?:[id number]}  Physical Exam:    VS:  There were no vitals taken for this visit.    Wt Readings from Last 3 Encounters:  11/27/19 211 lb (95.7 kg)  10/31/19 220 lb 12.8 oz (100.2 kg)  09/26/18 223 lb (101.2 kg)     Physical Exam ***     ASSESSMENT & PLAN:    ***  {Are you ordering a CV Procedure (e.g. stress test, cath, DCCV, TEE, etc)?   Press F2        :[id number]}    Dispo:  No follow-ups on file.   Medication Adjustments/Labs and Tests Ordered: Current medicines are reviewed at length with the patient today.  Concerns regarding medicines are outlined above.  Tests Ordered: No orders of the defined types were placed in this encounter.  Medication Changes: No orders of the defined types were placed in this encounter.   Signed, Richardson Dopp, PA-C  07/20/2020 1:44 PM    [facility] [place], [place], [place]  [address] Phone: [phone]; Fax: [phone]

## 2020-08-02 DIAGNOSIS — F330 Major depressive disorder, recurrent, mild: Secondary | ICD-10-CM | POA: Diagnosis not present

## 2020-08-02 DIAGNOSIS — F411 Generalized anxiety disorder: Secondary | ICD-10-CM | POA: Diagnosis not present

## 2020-08-18 DIAGNOSIS — I480 Paroxysmal atrial fibrillation: Secondary | ICD-10-CM | POA: Diagnosis not present

## 2020-08-18 DIAGNOSIS — N1831 Chronic kidney disease, stage 3a: Secondary | ICD-10-CM | POA: Diagnosis not present

## 2020-08-18 DIAGNOSIS — M79642 Pain in left hand: Secondary | ICD-10-CM | POA: Diagnosis not present

## 2020-08-18 DIAGNOSIS — E7800 Pure hypercholesterolemia, unspecified: Secondary | ICD-10-CM | POA: Diagnosis not present

## 2020-08-18 DIAGNOSIS — E1121 Type 2 diabetes mellitus with diabetic nephropathy: Secondary | ICD-10-CM | POA: Diagnosis not present

## 2020-08-18 DIAGNOSIS — E1142 Type 2 diabetes mellitus with diabetic polyneuropathy: Secondary | ICD-10-CM | POA: Diagnosis not present

## 2020-08-18 DIAGNOSIS — S61211D Laceration without foreign body of left index finger without damage to nail, subsequent encounter: Secondary | ICD-10-CM | POA: Diagnosis not present

## 2020-08-18 DIAGNOSIS — F331 Major depressive disorder, recurrent, moderate: Secondary | ICD-10-CM | POA: Diagnosis not present

## 2020-08-18 DIAGNOSIS — G5603 Carpal tunnel syndrome, bilateral upper limbs: Secondary | ICD-10-CM | POA: Diagnosis not present

## 2020-09-08 DIAGNOSIS — H04123 Dry eye syndrome of bilateral lacrimal glands: Secondary | ICD-10-CM | POA: Diagnosis not present

## 2020-09-08 DIAGNOSIS — H26491 Other secondary cataract, right eye: Secondary | ICD-10-CM | POA: Diagnosis not present

## 2020-09-08 DIAGNOSIS — E119 Type 2 diabetes mellitus without complications: Secondary | ICD-10-CM | POA: Diagnosis not present

## 2020-09-26 NOTE — Progress Notes (Signed)
Cardiology Office Note:    Date:  09/27/2020   ID:  [name], DOB 1945-12-28, MRN [id number]  PCP:  Lajean Manes, MD  Cardiologist:  Sinclair Grooms, MD   Referring MD: Lajean Manes, MD   Chief Complaint  Patient presents with  . Congestive Heart Failure  . Atrial Fibrillation    History of Present Illness:    [name] is a 75 y.o. female with a hx of PAF on amio and Eliquis, CHADS VASC3, DM, MVP, HLD, fibromyalgia, GERD and OSA.    She is doing well.  She denies symptoms.  She has had no significant recurrence of atrial fibrillation.  She was asymptomatic when A. fib was identified 9 years ago.  We went with a strategy of rhythm control and she has fared well since that time.  She has not had bleeding on her current medical regimen.  She has not noted blood in her urine or stool.  Have been no neurological complaints.  Past Medical History:  Diagnosis Date  . Cataract   . Complication of anesthesia 1975   projectile vomitting  . Diabetes mellitus   . Dyslipidemia   . Dysrhythmia    hx. A.Fib-Cardioversion-converted rhythm  . Fibromyalgia   . GERD (gastroesophageal reflux disease)   . Hypertension   . MVP (mitral valve prolapse)   . OSA (obstructive sleep apnea) 10-18-12   no cpap used now.  [name] PONV (postoperative nausea and vomiting)   . Vitamin D deficiency 10-18-12    Past Surgical History:  Procedure Laterality Date  . ABDOMINAL HYSTERECTOMY     '95  . BALLOON DILATION N/A 12/03/2012   Procedure: BALLOON DILATION;  Surgeon: [name], MD;  Location: [facility];  Service: Endoscopy;  Laterality: N/A;  . BREAST EXCISIONAL BIOPSY Left   . CARDIOVERSION  07/28/2011   Procedure: CARDIOVERSION;  Surgeon: Sinclair Grooms, MD;  Location: [facility];  Service: Cardiovascular;  Laterality: N/A;  . CATARACT EXTRACTION Left 10-18-12   10'13  . COLONOSCOPY WITH PROPOFOL N/A 03/29/2015   Procedure: COLONOSCOPY WITH PROPOFOL;  Surgeon: [name],  MD;  Location: [facility];  Service: Endoscopy;  Laterality: N/A;  . ESOPHAGEAL MANOMETRY N/A 05/19/2013   Procedure: ESOPHAGEAL MANOMETRY (EM);  Surgeon: [name], MD;  Location: [facility];  Service: Endoscopy;  Laterality: N/A;  . ESOPHAGOGASTRODUODENOSCOPY (EGD) WITH PROPOFOL N/A 12/03/2012   Procedure: ESOPHAGOGASTRODUODENOSCOPY (EGD) WITH Dilation & w/PROPOFOL;  Surgeon: [name], MD;  Location: [facility];  Service: Endoscopy;  Laterality: N/A;  . RETINAL DETACHMENT SURGERY Bilateral 11/2016  . THYROIDECTOMY  1981  . TUBAL LIGATION      Current Medications: Current Meds  Medication Sig  . acetaminophen (TYLENOL) 500 MG tablet Take 500 mg by mouth every 6 (six) hours as needed for mild pain or fever.   [name] amiodarone (PACERONE) 200 MG tablet Take 0.5 tablets (100 mg total) by mouth daily.  [name] amphetamine-dextroamphetamine (ADDERALL) 20 MG tablet Take 20 mg by mouth 3 (three) times daily.  [name] atorvastatin (LIPITOR) 80 MG tablet Take 80 mg by mouth at bedtime.  . BD PEN NEEDLE NANO U/F 32G X 4 MM MISC   . buPROPion (WELLBUTRIN SR) 150 MG 12 hr tablet Take 150 mg by mouth 2 (two) times daily.  [name] buPROPion (WELLBUTRIN XL) 300 MG 24 hr tablet Take 300 mg by mouth every morning.  . Cholecalciferol (VITAMIN D) 125 MCG (5000 UT) CAPS Take 5,000 Units by  mouth daily.   . clindamycin (CLEOCIN) 300 MG capsule Take 1 capsule (300 mg total) by mouth 3 (three) times daily.  [name] ELIQUIS 5 MG TABS tablet TAKE 1 TABLET TWICE A DAY  . esomeprazole (NEXIUM) 40 MG capsule TAKE 1 CAPSULE TWICE DAILY BEFORE MEALS  . FREESTYLE LITE test strip 4 (four) times daily.  . furosemide (LASIX) 20 MG tablet Take 0.5 tablets (10 mg total) by mouth daily as needed for fluid.  . Insulin Glargine (BASAGLAR KWIKPEN) 100 UNIT/ML SOPN Inject 70 Units into the skin daily.   . insulin lispro (HUMALOG) 100 UNIT/ML injection Inject 35 Units into the skin 3 (three) times daily.   . metoprolol tartrate  (LOPRESSOR) 25 MG tablet TAKE ONE TABLET BY MOUTH TWICE DAILY  . MOVANTIK 12.5 MG TABS tablet Take 12.5 mg by mouth daily.  [name] oxyCODONE (OXY IR/ROXICODONE) 5 MG immediate release tablet Take 5 mg by mouth every 6 (six) hours as needed (for breakthrough pain).  [name] oxyCODONE (OXYCONTIN) 40 MG 12 hr tablet Take 40 mg by mouth every 8 (eight) hours.  . pregabalin (LYRICA) 150 MG capsule Take 150 mg by mouth. 1 in the am and 2 in the pm  . sertraline (ZOLOFT) 50 MG tablet Take 50 mg by mouth 2 (two) times a day.   [name] tiZANidine (ZANAFLEX) 2 MG tablet Take 2 mg by mouth 3 (three) times daily as needed.     Allergies:   Iohexol, Morphine and related, Penicillins, Shellfish allergy, Demerol, Victoza [liraglutide], Dabigatran etexilate mesylate, and Latex   Social History   Socioeconomic History  . Marital status: Married    Spouse name: Not on file  . Number of children: Not on file  . Years of education: Not on file  . Highest education level: Not on file  Occupational History  . Not on file  Tobacco Use  . Smoking status: Never Smoker  . Smokeless tobacco: Never Used  Vaping Use  . Vaping Use: Never used  Substance and Sexual Activity  . Alcohol use: No  . Drug use: No  . Sexual activity: Yes  Other Topics Concern  . Not on file  Social History Narrative  . Not on file   Social Determinants of Health   Financial Resource Strain: Not on file  Food Insecurity: Not on file  Transportation Needs: Not on file  Physical Activity: Not on file  Stress: Not on file  Social Connections: Not on file     Family History: The patient's family history includes Atrial fibrillation in her father; Breast cancer in her maternal aunt; Bronchitis in her father; Diabetes type II in her father; Heart disease in her father; Hypertension in her mother; Hyperthyroidism in her mother; Kidney cancer in her sister.  ROS:   Please see the history of present illness.    No wheezing.  Not wearing CPAP  because she is unable to tolerate the mask.  She occasionally notices a slight reduction in O2 saturation.  Today's O2 saturation was 91%. 1 All other systems reviewed and are negative.  EKGs/Labs/Other Studies Reviewed:    The following studies were reviewed today: 2D Doppler echocardiogram performed in 2017: Study Conclusions   - Left ventricle: The cavity size was normal. Wall thickness was  normal. Systolic function was normal. The estimated ejection  fraction was in the range of 60% to 65%. Wall motion was normal;  there were no regional wall motion abnormalities. Left  ventricular diastolic function parameters were normal.  -  Left atrium: The atrium was mildly dilated.  - Atrial septum: No defect or patent foramen ovale was identified.  - Impressions: Normal GLS -17.9.   Impressions:   - Normal GLS -17.9.    EKG:  EKG sinus rhythm 68 bpm, normal PR at 194 ms, normal appearance with slight left axis deviation.  Recent Labs: 10/31/2019: ALT 27; NT-Pro BNP 172; TSH 5.380 11/27/2019: BUN 36; Creatinine, Ser 2.34; Hemoglobin 13.7; Platelets 277; Potassium 4.4; Sodium 140  Recent Lipid Panel No results found for: CHOL, TRIG, HDL, CHOLHDL, VLDL, LDLCALC, LDLDIRECT  Physical Exam:    VS:  BP 120/70 (BP Location: Left Arm, Patient Position: Sitting, Cuff Size: Normal)   Pulse 68   Ht 5\' 5"  (1.651 m)   Wt 214 lb (97.1 kg)   SpO2 91%   BMI 35.61 kg/m     Wt Readings from Last 3 Encounters:  09/27/20 214 lb (97.1 kg)  11/27/19 211 lb (95.7 kg)  10/31/19 220 lb 12.8 oz (100.2 kg)     GEN: Obese. No acute distress HEENT: Normal NECK: No JVD. LYMPHATICS: No lymphadenopathy CARDIAC: No murmur. RRR no gallop, or edema. VASCULAR:  Normal Pulses. No bruits. RESPIRATORY:  Clear to auscultation without rales, wheezing or rhonchi  ABDOMEN: Soft, non-tender, non-distended, No pulsatile mass, MUSCULOSKELETAL: No deformity  SKIN: Warm and dry NEUROLOGIC:  Alert and oriented x  3 PSYCHIATRIC:  Normal affect   ASSESSMENT:    1. Paroxysmal atrial fibrillation (HCC)   2. Essential hypertension   3. Obstructive sleep apnea   4. On amiodarone therapy   5. Anticoagulation goal of INR 2 to 3    PLAN:    In order of problems listed above:  1. Rhythm control with low-dose amiodarone 100 mg/day. 2. Excellent blood pressure control on current therapy which includes Lopressor 25 mg twice daily and furosemide 20 mg/day. 3. She has not compliant with CPAP therapy.  She does have hypoxia with an O2 saturation of 91% today.  Lungs are clear.  There is no JVD.  Chest x-ray will be done sometime between now and August to rule out development of fibrosis related to amiodarone therapy. 4. TSH, liver panel, and PA lateral chest x-ray before August 2021 to. 5. Continue Eliquis therapy 5 mg twice daily for stroke prophylaxis.   Medication Adjustments/Labs and Tests Ordered: Current medicines are reviewed at length with the patient today.  Concerns regarding medicines are outlined above.  Orders Placed This Encounter  Procedures  . EKG 12-Lead   No orders of the defined types were placed in this encounter.   There are no Patient Instructions on file for this visit.   Signed, Sinclair Grooms, MD  09/27/2020 4:04 PM    [facility]

## 2020-09-27 ENCOUNTER — Encounter: Payer: Self-pay | Admitting: Interventional Cardiology

## 2020-09-27 ENCOUNTER — Ambulatory Visit (INDEPENDENT_AMBULATORY_CARE_PROVIDER_SITE_OTHER): Payer: Medicare Other | Admitting: Interventional Cardiology

## 2020-09-27 ENCOUNTER — Encounter (INDEPENDENT_AMBULATORY_CARE_PROVIDER_SITE_OTHER): Payer: Self-pay

## 2020-09-27 ENCOUNTER — Other Ambulatory Visit: Payer: Self-pay

## 2020-09-27 VITALS — BP 120/70 | HR 68 | Ht 65.0 in | Wt 214.0 lb

## 2020-09-27 DIAGNOSIS — Z79899 Other long term (current) drug therapy: Secondary | ICD-10-CM | POA: Diagnosis not present

## 2020-09-27 DIAGNOSIS — G4733 Obstructive sleep apnea (adult) (pediatric): Secondary | ICD-10-CM | POA: Diagnosis not present

## 2020-09-27 DIAGNOSIS — I10 Essential (primary) hypertension: Secondary | ICD-10-CM

## 2020-09-27 DIAGNOSIS — I480 Paroxysmal atrial fibrillation: Secondary | ICD-10-CM

## 2020-09-27 DIAGNOSIS — Z7901 Long term (current) use of anticoagulants: Secondary | ICD-10-CM

## 2020-09-27 DIAGNOSIS — Z5181 Encounter for therapeutic drug level monitoring: Secondary | ICD-10-CM | POA: Diagnosis not present

## 2020-09-27 NOTE — Patient Instructions (Signed)
Medication Instructions:  Your physician recommends that you continue on your current medications as directed. Please refer to the Current Medication list given to you today.  *If you need a refill on your cardiac medications before your next appointment, please call your pharmacy*   Lab Work: TSH and Liver on August 19th.  You can come anytime between 7:30am-4:30pm.  If you have labs (blood work) drawn today and your tests are completely normal, you will receive your results only by: [name] MyChart Message (if you have MyChart) OR . A paper copy in the mail If you have any lab test that is abnormal or we need to change your treatment, we will call you to review the results.   Testing/Procedures: A chest x-ray takes a picture of the organs and structures inside the chest, including the heart, lungs, and blood vessels. This test can show several things, including, whether the heart is enlarges; whether fluid is building up in the lungs; and whether pacemaker / defibrillator leads are still in place. You will have this done at [facility] on the same day that you have your Bone Density test.      Follow-Up: At [facility], you and your health needs are our priority.  As part of our continuing mission to provide you with exceptional heart care, we have created designated Provider Care Teams.  These Care Teams include your primary Cardiologist (physician) and Advanced Practice Providers (APPs -  Physician Assistants and Nurse Practitioners) who all work together to provide you with the care you need, when you need it.  We recommend signing up for the patient portal called "MyChart".  Sign up information is provided on this After Visit Summary.  MyChart is used to connect with patients for Virtual Visits (Telemedicine).  Patients are able to view lab/test results, encounter notes, upcoming appointments, etc.  Non-urgent messages can be sent to your provider as well.   To learn more about  what you can do with MyChart, go to [email or web address].    Your next appointment:   9 month(s)  The format for your next appointment:   In Person  Provider:   You may see Sinclair Grooms, MD or one of the following Advanced Practice Providers on your designated Care Team:    Kathyrn Drown, NP    Other Instructions

## 2020-09-30 ENCOUNTER — Other Ambulatory Visit: Payer: Self-pay | Admitting: Interventional Cardiology

## 2020-09-30 DIAGNOSIS — H04123 Dry eye syndrome of bilateral lacrimal glands: Secondary | ICD-10-CM | POA: Diagnosis not present

## 2020-09-30 DIAGNOSIS — H524 Presbyopia: Secondary | ICD-10-CM | POA: Diagnosis not present

## 2020-09-30 NOTE — Telephone Encounter (Addendum)
Prescription refill request for Eliquis received. Indication: afib  Last office visit: 09/27/2020, Tamala Julian Scr: 1.40, 07/14/2020 Age: 75 yo  Weight: 97.1 kg   Pt is on the correct dose of Eliquis per dosing criteria. Prescription refill sent for Eliquis 5mg  BID.

## 2020-10-13 ENCOUNTER — Other Ambulatory Visit: Payer: Self-pay

## 2020-10-13 ENCOUNTER — Ambulatory Visit
Admission: RE | Admit: 2020-10-13 | Discharge: 2020-10-13 | Disposition: A | Discharge status: Home / Self Care | Payer: Medicare Other | Source: Ambulatory Visit | Attending: Geriatric Medicine | Admitting: Geriatric Medicine

## 2020-10-13 DIAGNOSIS — Z780 Asymptomatic menopausal state: Secondary | ICD-10-CM | POA: Diagnosis not present

## 2020-10-13 DIAGNOSIS — E2839 Other primary ovarian failure: Secondary | ICD-10-CM

## 2020-10-13 DIAGNOSIS — M85851 Other specified disorders of bone density and structure, right thigh: Secondary | ICD-10-CM | POA: Diagnosis not present

## 2020-10-26 DIAGNOSIS — Z780 Asymptomatic menopausal state: Secondary | ICD-10-CM | POA: Diagnosis not present

## 2020-10-26 DIAGNOSIS — L719 Rosacea, unspecified: Secondary | ICD-10-CM | POA: Diagnosis not present

## 2020-10-26 DIAGNOSIS — M8588 Other specified disorders of bone density and structure, other site: Secondary | ICD-10-CM | POA: Diagnosis not present

## 2020-10-29 DIAGNOSIS — E1121 Type 2 diabetes mellitus with diabetic nephropathy: Secondary | ICD-10-CM | POA: Diagnosis not present

## 2020-10-29 DIAGNOSIS — N1831 Chronic kidney disease, stage 3a: Secondary | ICD-10-CM | POA: Diagnosis not present

## 2020-10-29 DIAGNOSIS — F331 Major depressive disorder, recurrent, moderate: Secondary | ICD-10-CM | POA: Diagnosis not present

## 2020-10-29 DIAGNOSIS — E1142 Type 2 diabetes mellitus with diabetic polyneuropathy: Secondary | ICD-10-CM | POA: Diagnosis not present

## 2020-10-29 DIAGNOSIS — E7800 Pure hypercholesterolemia, unspecified: Secondary | ICD-10-CM | POA: Diagnosis not present

## 2020-10-29 DIAGNOSIS — I480 Paroxysmal atrial fibrillation: Secondary | ICD-10-CM | POA: Diagnosis not present

## 2020-11-17 ENCOUNTER — Telehealth: Payer: Self-pay | Admitting: *Deleted

## 2020-11-17 NOTE — Telephone Encounter (Signed)
   [facility] Pre-operative Risk Assessment    Patient Name: LIBIA FAZZINI  DOB: June 19, 1945 MRN: [id number]  HEARTCARE STAFF:  - IMPORTANT!!!!!! Under Visit Info/Reason for Call, type in Other and utilize the format Clearance MM/DD/YY or Clearance TBD. Do not use dashes or single digits. - Please review there is not already an duplicate clearance open for this procedure. - If request is for dental extraction, please clarify the # of teeth to be extracted. - If the patient is currently at the dentist's office, call Pre-Op Callback Staff (MA/nurse) to input urgent request.  - If the patient is not currently in the dentist office, please route to the Pre-Op pool.  Request for surgical clearance:  What type of surgery is being performed?  DENTAL IMPLANT RECONSTRUCTION    When is this surgery scheduled?  TBD  What type of clearance is required (medical clearance vs. Pharmacy clearance to hold med vs. Both)?  BOTH  Are there any medications that need to be held prior to surgery and how long?  [place] name and name of physician performing surgery?  [facility]   What is the office phone number?  [phone]   7.   What is the office fax number?  [phone]  8.   Anesthesia type (None, local, MAC, general) ?  ANESTHESIA    Jeanann Lewandowsky 11/17/2020, 3:03 PM  _________________________________________________________________   (provider comments below)

## 2020-11-18 NOTE — Telephone Encounter (Signed)
S/w dental office to confirm the pt will be having 2 procedures. I advised we can only clear for 1 procedure at a time. First procedure will be extraction of 11 teeth under general anesthesia. Surgeon is Dr. Jaquita Folds, [place]. I have asked that when ready for the 2nd part of Tx plan to please fax over a new clearance request with the procedure being done at that time. This clearance will be for the first Tx plan.

## 2020-11-18 NOTE — Telephone Encounter (Signed)
Please contact requesting office for details surrounding procedure.  We did not provide blanket coverage.  Once details are provided we will be able to make recommendations surrounding her upcoming surgery.  Thank you,  Jossie Ng. Yaritzi Craun NP-C    11/18/2020, 10:10 AM [address] [place] Office [phone] Fax [phone]

## 2020-11-19 NOTE — Telephone Encounter (Signed)
Patient with diagnosis of atrial fibrillation on Eliquis for anticoagulation.    Procedure: dental implant reconstruction Date of procedure: TBD   CHA2DS2-VASc Score = 4  This indicates a 4.8% annual risk of stroke. The patient's score is based upon: CHF History: No HTN History: Yes Diabetes History: Yes Stroke History: No Vascular Disease History: No Age Score: 1 Gender Score: 1   CrCl 41 Platelet count 318  Per office protocol, patient can hold Elquis for 2 days prior to procedure.   Patient will not need bridging with Lovenox (enoxaparin) around procedure.  Patient should restart Eliquis on the evening of procedure or day after, at discretion of procedure MD

## 2020-11-19 NOTE — Telephone Encounter (Signed)
   Primary Cardiologist: Sinclair Grooms, MD  Chart reviewed as part of pre-operative protocol coverage. Given past medical history and time since last visit, based on ACC/AHA guidelines, [name] would be at acceptable risk for the planned procedure without further cardiovascular testing.   Patient with diagnosis of atrial fibrillation on Eliquis for anticoagulation.     Procedure: dental implant reconstruction Date of procedure: TBD     CHA2DS2-VASc Score = 4  This indicates a 4.8% annual risk of stroke. The patient's score is based upon: CHF History: No HTN History: Yes Diabetes History: Yes Stroke History: No Vascular Disease History: No Age Score: 1 Gender Score: 1    CrCl 41 Platelet count 318   Per office protocol, patient can hold Elquis for 2 days prior to procedure.   Patient will not need bridging with Lovenox (enoxaparin) around procedure.   Patient should restart Eliquis on the evening of procedure or day after, at discretion of procedure MD  I will route this recommendation to the requesting party via [facility] fax function and remove from pre-op pool.  Please call with questions.  Jossie Ng. Kayla Weekes NP-C    11/19/2020, 2:26 PM [facility] [place] [address] Office [phone] Fax [phone]

## 2020-11-23 NOTE — Telephone Encounter (Signed)
    Terri with [facility], she said they got clearance but they need to have the form she sent filled out by doctor with name and signature on it.

## 2020-11-24 NOTE — Telephone Encounter (Signed)
Attempted to call back and speak to [name] had dental implant Center, unfortunately they are closed for today.  We will need to try another day

## 2020-12-10 ENCOUNTER — Other Ambulatory Visit: Payer: Medicare Other

## 2020-12-11 DIAGNOSIS — E161 Other hypoglycemia: Secondary | ICD-10-CM | POA: Diagnosis not present

## 2020-12-11 DIAGNOSIS — E162 Hypoglycemia, unspecified: Secondary | ICD-10-CM | POA: Diagnosis not present

## 2020-12-11 DIAGNOSIS — I10 Essential (primary) hypertension: Secondary | ICD-10-CM | POA: Diagnosis not present

## 2020-12-11 DIAGNOSIS — R404 Transient alteration of awareness: Secondary | ICD-10-CM | POA: Diagnosis not present

## 2020-12-11 DIAGNOSIS — R456 Violent behavior: Secondary | ICD-10-CM | POA: Diagnosis not present

## 2020-12-21 ENCOUNTER — Ambulatory Visit
Admission: RE | Admit: 2020-12-21 | Discharge: 2020-12-21 | Disposition: A | Discharge status: Home / Self Care | Payer: Medicare Other | Source: Ambulatory Visit | Attending: Interventional Cardiology | Admitting: Interventional Cardiology

## 2020-12-21 DIAGNOSIS — E1165 Type 2 diabetes mellitus with hyperglycemia: Secondary | ICD-10-CM | POA: Diagnosis not present

## 2020-12-21 DIAGNOSIS — R531 Weakness: Secondary | ICD-10-CM | POA: Diagnosis not present

## 2020-12-21 DIAGNOSIS — Z794 Long term (current) use of insulin: Secondary | ICD-10-CM | POA: Diagnosis not present

## 2020-12-21 DIAGNOSIS — E1142 Type 2 diabetes mellitus with diabetic polyneuropathy: Secondary | ICD-10-CM | POA: Diagnosis not present

## 2020-12-21 DIAGNOSIS — R0600 Dyspnea, unspecified: Secondary | ICD-10-CM | POA: Diagnosis not present

## 2020-12-21 DIAGNOSIS — Z79899 Other long term (current) drug therapy: Secondary | ICD-10-CM

## 2020-12-21 DIAGNOSIS — M797 Fibromyalgia: Secondary | ICD-10-CM | POA: Diagnosis not present

## 2020-12-21 DIAGNOSIS — Z7984 Long term (current) use of oral hypoglycemic drugs: Secondary | ICD-10-CM | POA: Diagnosis not present

## 2020-12-21 DIAGNOSIS — R0609 Other forms of dyspnea: Secondary | ICD-10-CM | POA: Diagnosis not present

## 2020-12-21 DIAGNOSIS — R0789 Other chest pain: Secondary | ICD-10-CM | POA: Diagnosis not present

## 2020-12-23 ENCOUNTER — Other Ambulatory Visit: Payer: Self-pay | Admitting: *Deleted

## 2020-12-23 ENCOUNTER — Other Ambulatory Visit: Payer: Self-pay

## 2020-12-23 DIAGNOSIS — I480 Paroxysmal atrial fibrillation: Secondary | ICD-10-CM

## 2020-12-23 MED ORDER — METOPROLOL TARTRATE 25 MG PO TABS
25.0000 mg | ORAL_TABLET | Freq: Two times a day (BID) | ORAL | 1 refills | Status: DC
Start: 2020-12-23 — End: 2021-06-13

## 2020-12-23 MED ORDER — AMIODARONE HCL 200 MG PO TABS: 100.0000 mg | ORAL_TABLET | Freq: Every day | ORAL | 3 refills | Status: DC

## 2021-01-05 DIAGNOSIS — I480 Paroxysmal atrial fibrillation: Secondary | ICD-10-CM | POA: Diagnosis not present

## 2021-01-05 DIAGNOSIS — N1831 Chronic kidney disease, stage 3a: Secondary | ICD-10-CM | POA: Diagnosis not present

## 2021-01-05 DIAGNOSIS — E7800 Pure hypercholesterolemia, unspecified: Secondary | ICD-10-CM | POA: Diagnosis not present

## 2021-01-05 DIAGNOSIS — F331 Major depressive disorder, recurrent, moderate: Secondary | ICD-10-CM | POA: Diagnosis not present

## 2021-01-05 DIAGNOSIS — E1121 Type 2 diabetes mellitus with diabetic nephropathy: Secondary | ICD-10-CM | POA: Diagnosis not present

## 2021-01-05 DIAGNOSIS — E1142 Type 2 diabetes mellitus with diabetic polyneuropathy: Secondary | ICD-10-CM | POA: Diagnosis not present

## 2021-01-05 NOTE — Telephone Encounter (Signed)
   Name: [name]  DOB: [birth year]  MRN: [id number]   Primary Cardiologist: Sinclair Grooms, MD  Chart reviewed as part of pre-operative protocol coverage. Patient was contacted 01/05/2021 in reference to pre-operative risk assessment for pending surgery as outlined below.  [name] was last seen on 09/27/20 by Dr. Tamala Julian.  Since that day, [name] has done well. She does not have a history of ischemic heart disease. She can complete 4.0 METS without angina.   Per our clinical pharmacist:  Per office protocol, patient can hold Elquis for 2 days prior to procedure.   Patient will not need bridging with Lovenox (enoxaparin) around procedure.   Patient should restart Eliquis on the evening of procedure or day after, at discretion of procedure MD  Pt may have all teeth removed tomorrow. . Please send a separate clearance for dental implants in 4 months.   Therefore, based on ACC/AHA guidelines, the patient would be at acceptable risk for the planned procedure without further cardiovascular testing.   The patient was advised that if she develops new symptoms prior to surgery to contact our office to arrange for a follow-up visit, and she verbalized understanding.  I will route this recommendation to the requesting party via Epic fax function and remove from pre-op pool. Please call with questions.  Tami Lin Alleene Stoy, PA 01/05/2021, 3:53 PM

## 2021-01-05 NOTE — Telephone Encounter (Signed)
Called dental office but they are closed. Will need to try to reach them tomorrow.

## 2021-01-06 NOTE — Telephone Encounter (Signed)
Spoke with dentist office and they stated that they did receive the clearance from our office yesterday and that the patient is scheduled to have her treatment with them on Monday 01/10/21.

## 2021-01-14 ENCOUNTER — Other Ambulatory Visit: Payer: Self-pay | Admitting: Geriatric Medicine

## 2021-01-14 DIAGNOSIS — Z1231 Encounter for screening mammogram for malignant neoplasm of breast: Secondary | ICD-10-CM

## 2021-01-25 DIAGNOSIS — E1122 Type 2 diabetes mellitus with diabetic chronic kidney disease: Secondary | ICD-10-CM | POA: Diagnosis not present

## 2021-01-25 DIAGNOSIS — M1990 Unspecified osteoarthritis, unspecified site: Secondary | ICD-10-CM | POA: Diagnosis not present

## 2021-01-25 DIAGNOSIS — N179 Acute kidney failure, unspecified: Secondary | ICD-10-CM | POA: Diagnosis not present

## 2021-01-25 DIAGNOSIS — K220 Achalasia of cardia: Secondary | ICD-10-CM | POA: Diagnosis not present

## 2021-01-25 DIAGNOSIS — E1142 Type 2 diabetes mellitus with diabetic polyneuropathy: Secondary | ICD-10-CM | POA: Diagnosis not present

## 2021-01-25 DIAGNOSIS — N1832 Chronic kidney disease, stage 3b: Secondary | ICD-10-CM | POA: Diagnosis not present

## 2021-01-25 DIAGNOSIS — R3911 Hesitancy of micturition: Secondary | ICD-10-CM | POA: Diagnosis not present

## 2021-01-25 DIAGNOSIS — Z794 Long term (current) use of insulin: Secondary | ICD-10-CM | POA: Diagnosis not present

## 2021-01-26 DIAGNOSIS — F411 Generalized anxiety disorder: Secondary | ICD-10-CM | POA: Diagnosis not present

## 2021-01-26 DIAGNOSIS — F3341 Major depressive disorder, recurrent, in partial remission: Secondary | ICD-10-CM | POA: Diagnosis not present

## 2021-02-03 DIAGNOSIS — E875 Hyperkalemia: Secondary | ICD-10-CM | POA: Diagnosis not present

## 2021-02-04 ENCOUNTER — Ambulatory Visit: Payer: Medicare Other

## 2021-03-01 ENCOUNTER — Ambulatory Visit: Payer: Medicare Other

## 2021-03-04 ENCOUNTER — Ambulatory Visit: Payer: Medicare Other | Admitting: Gastroenterology

## 2021-03-04 ENCOUNTER — Ambulatory Visit
Admission: RE | Admit: 2021-03-04 | Discharge: 2021-03-04 | Disposition: A | Discharge status: Home / Self Care | Payer: Medicare Other | Source: Ambulatory Visit | Attending: Geriatric Medicine | Admitting: Geriatric Medicine

## 2021-03-04 ENCOUNTER — Other Ambulatory Visit: Payer: Self-pay

## 2021-03-04 DIAGNOSIS — Z1231 Encounter for screening mammogram for malignant neoplasm of breast: Secondary | ICD-10-CM | POA: Diagnosis not present

## 2021-03-08 ENCOUNTER — Ambulatory Visit: Payer: Medicare Other | Admitting: Gastroenterology

## 2021-04-24 DIAGNOSIS — K224 Dyskinesia of esophagus: Secondary | ICD-10-CM

## 2021-04-24 HISTORY — DX: Dyskinesia of esophagus: K22.4

## 2021-05-23 ENCOUNTER — Telehealth: Payer: Self-pay

## 2021-05-23 ENCOUNTER — Encounter: Payer: Self-pay | Admitting: Physician Assistant

## 2021-05-23 ENCOUNTER — Ambulatory Visit (INDEPENDENT_AMBULATORY_CARE_PROVIDER_SITE_OTHER): Payer: Medicare Other | Admitting: Physician Assistant

## 2021-05-23 VITALS — BP 124/72 | HR 64 | Ht 65.0 in | Wt 204.5 lb

## 2021-05-23 DIAGNOSIS — E875 Hyperkalemia: Secondary | ICD-10-CM | POA: Diagnosis not present

## 2021-05-23 DIAGNOSIS — M1990 Unspecified osteoarthritis, unspecified site: Secondary | ICD-10-CM | POA: Diagnosis not present

## 2021-05-23 DIAGNOSIS — K5909 Other constipation: Secondary | ICD-10-CM | POA: Diagnosis not present

## 2021-05-23 DIAGNOSIS — E1122 Type 2 diabetes mellitus with diabetic chronic kidney disease: Secondary | ICD-10-CM | POA: Diagnosis not present

## 2021-05-23 DIAGNOSIS — Z800 Family history of malignant neoplasm of digestive organs: Secondary | ICD-10-CM

## 2021-05-23 DIAGNOSIS — N179 Acute kidney failure, unspecified: Secondary | ICD-10-CM | POA: Diagnosis not present

## 2021-05-23 DIAGNOSIS — Z86010 Personal history of colonic polyps: Secondary | ICD-10-CM | POA: Diagnosis not present

## 2021-05-23 DIAGNOSIS — R1310 Dysphagia, unspecified: Secondary | ICD-10-CM | POA: Diagnosis not present

## 2021-05-23 DIAGNOSIS — N1832 Chronic kidney disease, stage 3b: Secondary | ICD-10-CM | POA: Diagnosis not present

## 2021-05-23 DIAGNOSIS — K220 Achalasia of cardia: Secondary | ICD-10-CM | POA: Diagnosis not present

## 2021-05-23 NOTE — Telephone Encounter (Signed)
Patient with diagnosis of afib on Eliquis for anticoagulation.    Procedure: colonoscopy/endoscopy Date of procedure: 06/22/21  CHA2DS2-VASc Score = 4  This indicates a 4.8% annual risk of stroke. The patient's score is based upon: CHF History: 0 HTN History: 1 Diabetes History: 1 Stroke History: 0 Vascular Disease History: 0 Age Score: 1 Gender Score: 1   CHF is listed as chief complaint in cardiologist note 09/27/20 however this is not documented anywhere else in her chart and she's had a single echo from 2017 that was normal. Unclear if this was listed in error, will not count as risk factor.  CrCl 48mL/min using adjusted body weight Platelet count 318K  Per office protocol, patient can hold Eliquis for 2 days prior to procedure.

## 2021-05-23 NOTE — Telephone Encounter (Signed)
° °  Primary Cardiologist: Sinclair Grooms, MD  Chart reviewed as part of pre-operative protocol coverage. Given past medical history and time since last visit, based on ACC/AHA guidelines, [name] would be at acceptable risk for the planned procedure without further cardiovascular testing.   Patient with diagnosis of afib on Eliquis for anticoagulation.     Procedure: colonoscopy/endoscopy Date of procedure: 06/22/21   CHA2DS2-VASc Score = 4  This indicates a 4.8% annual risk of stroke. The patient's score is based upon: CHF History: 0 HTN History: 1 Diabetes History: 1 Stroke History: 0 Vascular Disease History: 0 Age Score: 1 Gender Score: 1   CHF is listed as chief complaint in cardiologist note 09/27/20 however this is not documented anywhere else in her chart and she's had a single echo from 2017 that was normal. Unclear if this was listed in error, will not count as risk factor.   CrCl 28mL/min using adjusted body weight Platelet count 318K   Per office protocol, patient can hold Eliquis for 2 days prior to procedure.   I will route this recommendation to the requesting party via Epic fax function and remove from pre-op pool.  Please call with questions.  Jossie Ng. Baneen Wieseler NP-C    05/23/2021, 3:10 PM [place] [address] Office [phone] Fax [phone]

## 2021-05-23 NOTE — Patient Instructions (Signed)
If you are age 74 or older, your body mass index should be between 23-30. Your Body mass index is 34.03 kg/m. If this is out of the aforementioned range listed, please consider follow up with your Primary Care Provider. ___________________________________________  The [facility] providers would like to encourage you to use [facility] to communicate with providers for non-urgent requests or questions.  Due to long hold times on the telephone, sending your provider a message by [facility] may be a faster and more efficient way to get a response.  Please allow 48 business hours for a response.  Please remember that this is for non-urgent requests.  _______________________________________________________  [name] have been scheduled for an endoscopy and colonoscopy. Please follow the written instructions given to you at your visit today. Please pick up your prep supplies at the pharmacy within the next 1-3 days. If you use inhalers (even only as needed), please bring them with you on the day of your procedure.  You will be contacted by our office prior to your procedure for directions on holding your Eliquis.  If you do not hear from our office 1 week prior to your scheduled procedure, please call [phone] to discuss.   Continue Nexium 40 mg 1 capsule twice daily.  Thank you for entrusting me with your care and choosing [facility].  Amy Esterwood, PA-C

## 2021-05-23 NOTE — Telephone Encounter (Signed)
[facility] Pre-operative Risk Assessment     Request for surgical clearance:     Endoscopy Procedure  What type of surgery is being performed?     Colonoscopy/Endoscopy  When is this surgery scheduled?     06/22/2021  What type of clearance is required ?   Pharmacy  Are there any medications that need to be held prior to surgery and how long? Eliquis 2 days prior   Practice name and name of physician performing surgery?      [facility]  What is your office phone and fax number?      Phone- [phone]  [phone]  Anesthesia type (None, local, MAC, general) ?       MAC

## 2021-05-23 NOTE — Progress Notes (Signed)
Subjective:    Patient ID: [name], female    DOB: 03-14-1946, 76 y.o.   MRN: [id number]  HPI [name] is a pleasant 76 year old white female, established with Dr. Silverio Decamp who was last evaluated in 2020.  On both prior visits she was to be scheduled for EGD and colonoscopy, but did not follow through with scheduling. Patient comes back in today stating that she knows that she needs to go ahead and have procedures done. 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  She says as long as she stays on the low-dose of Movantik she will have regular bowel movements though not necessarily every day.  No melena or hematochezia no current complaints of abdominal pain. She had previously undergone evaluation per Dr. Howell Rucks and last attempted colonoscopy was done in 2016 which was incomplete with inability to get beyond the splenic flexure. 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 Dr. Silverio Decamp had reviewed the study and per her previous notes did not feel that patient met criteria for achalasia and felt that the manometry findings were more consistent with an esophageal stricture.  She is currently on Nexium 40 mg p.o. twice daily which she says controls her reflux symptoms.  She has also learned to adjust her eating habits, with eating much slower, chewing her food very carefully, sipping fluids between bites and avoiding certain foods like heavier meats etc. which has helped her symptoms.  She does not really feel that the symptoms have progressed over the past couple of years.  She still has episodes requiring regurgitation intermittently.  She is on Eliquis chronically, for atrial fibrillation, managed by Dr. Daneen Schick, history of hypertension, sleep apnea, no oxygen use, fibromyalgia, chronic fatigue  syndrome, chronic pain, chronic kidney disease stage III and adult onset diabetes mellitus.  Review of Systems. Pertinent positive and negative review of systems were noted in the above HPI section.  All other review of systems was otherwise negative.   Outpatient Encounter Medications as of 05/23/2021  Medication Sig   acetaminophen (TYLENOL) 500 MG tablet Take 500 mg by mouth every 6 (six) hours as needed for mild pain or fever.    amiodarone (PACERONE) 200 MG tablet Take 0.5 tablets (100 mg total) by mouth daily.   amphetamine-dextroamphetamine (ADDERALL) 20 MG tablet Take 20 mg by mouth 3 (three) times daily.   atorvastatin (LIPITOR) 80 MG tablet Take 80 mg by mouth at bedtime.   BD PEN NEEDLE NANO U/F 32G X 4 MM MISC    buPROPion (WELLBUTRIN XL) 300 MG 24 hr tablet Take 300 mg by mouth every morning.   Cholecalciferol (VITAMIN D) 125 MCG (5000 UT) CAPS Take 5,000 Units by mouth daily.    ELIQUIS 5 MG TABS tablet TAKE 1 TABLET TWICE A DAY   esomeprazole (NEXIUM) 40 MG capsule TAKE 1 CAPSULE TWICE DAILY BEFORE MEALS   FREESTYLE LITE test strip 4 (four) times daily.   furosemide (LASIX) 20 MG tablet Take 0.5 tablets (10 mg total) by mouth daily as needed for fluid.   Insulin Glargine (BASAGLAR KWIKPEN) 100 UNIT/ML SOPN Inject 40 Units into the skin daily.   insulin lispro (HUMALOG) 100 UNIT/ML injection Inject 15 Units into the skin 3 (three) times daily. Inject 15-25 units into the skin 3 times daily   metoprolol tartrate (  LOPRESSOR) 25 MG tablet Take 1 tablet (25 mg total) by mouth 2 (two) times daily.   MOVANTIK 12.5 MG TABS tablet Take 12.5 mg by mouth daily.   oxyCODONE (OXY IR/ROXICODONE) 5 MG immediate release tablet Take 5 mg by mouth every 6 (six) hours as needed (for breakthrough pain).   OXYCONTIN 30 MG 12 hr tablet Take 1 tablet by mouth 3 (three) times daily.   pregabalin (LYRICA) 150 MG capsule Take 150 mg by mouth. 1 in the am and 2 in the pm   sertraline (ZOLOFT) 50 MG tablet  Take 50 mg by mouth 2 (two) times a day.    tiZANidine (ZANAFLEX) 2 MG tablet Take 2 mg by mouth 3 (three) times daily as needed.   [DISCONTINUED] oxyCODONE (OXYCONTIN) 40 MG 12 hr tablet Take 40 mg by mouth every 8 (eight) hours.   [DISCONTINUED] buPROPion (WELLBUTRIN SR) 150 MG 12 hr tablet Take 150 mg by mouth 2 (two) times daily.   [DISCONTINUED] clindamycin (CLEOCIN) 300 MG capsule Take 1 capsule (300 mg total) by mouth 3 (three) times daily.   [DISCONTINUED] estrogens, conjugated, (PREMARIN) 0.9 MG tablet Take 0.9 mg by mouth daily.   [DISCONTINUED] glimepiride (AMARYL) 4 MG tablet Take 4 mg by mouth 2 (two) times daily.    [DISCONTINUED] metFORMIN (GLUCOPHAGE) 500 MG tablet Take 1,000 mg by mouth 2 (two) times daily with a meal.     No facility-administered encounter medications on file as of 05/23/2021.   Allergies  Allergen Reactions   Iohexol Other (See Comments)     Desc: EYES & LIPS SWELLING, SOB DURING A MYELOGRAM '88, DECADRON GIVEN/ PRE MEDS REQUIRED/A.C., Onset Date: [id number]    Morphine And Related Other (See Comments)    hyperactivity   Penicillins Hives and Swelling    Has patient had a PCN reaction causing immediate rash, facial/tongue/throat swelling, SOB or lightheadedness with hypotension: Yes Has patient had a PCN reaction causing severe rash involving mucus membranes or skin necrosis: No Has patient had a PCN reaction that required hospitalization No Has patient had a PCN reaction occurring within the last 10 years: No If all of the above answers are "NO", then may proceed with Cephalosporin use.    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   Obstructive sleep apnea 07/10/2007   Essential hypertension 07/10/2007   Mitral valve disorder 07/10/2007   CHRONIC FATIGUE SYNDROME 07/10/2007   Social History   Socioeconomic History   Marital status: Married    Spouse name: Not on file   Number of children: Not on file   Years of education: Not on file   Highest education level: Not on file  Occupational History   Not on file  Tobacco Use   Smoking status: Never   Smokeless tobacco: Never  Vaping Use   Vaping Use: Never used  Substance and Sexual Activity   Alcohol use: No  Drug use: No   Sexual activity: Yes  Other Topics Concern   Not on file  Social History Narrative   Not on file   Social Determinants of Health   Financial Resource Strain: Not on file  Food Insecurity: Not on file  Transportation Needs: Not on file  Physical Activity: Not on file  Stress: Not on file  Social Connections: Not on file  Intimate Partner Violence: Not on file    [name]'s family history includes Atrial fibrillation in her father; Breast cancer in her maternal aunt; Bronchitis in her father; Diabetes type II in her father; Heart disease in her father; Hypertension in her mother; Hyperthyroidism in her mother; Kidney cancer in her sister.      Objective:    Vitals:   05/23/21 1111  BP: 124/72  Pulse: 64  SpO2: 96%    Physical Exam Well-developed well-nourished folder WF n no acute distress.  Height, Weight, 204 BMI34.0  HEENT; nontraumatic normocephalic, EOMI, PE R LA, sclera anicteric. Oropharynx; not examined today Neck; supple, no JVD Cardiovascular; regular rate and rhythm with S1-S2, no murmur rub or gallop Pulmonary; Clear bilaterally Abdomen; soft, nontender, nondistended, no palpable mass or  hepatosplenomegaly, bowel sounds are active Rectal; not done today Skin; benign exam, no jaundice rash or appreciable lesions Extremities; no clubbing cyanosis or edema skin warm and dry Neuro/Psych; alert and oriented x4, grossly nonfocal mood and affect appropriate        Assessment & Plan:   #15 76 year old white female with positive family history of colon cancer, incomplete colonoscopy 2016, who overdue for surveillance colonoscopy.  #2 chronic constipation-stable on Movantik, opioid related #3 chronic dysphagia with intermittent regurgitation-previous manometry 2015 with EG junction outflow obstruction initially concerning for achalasia type II however on review per Dr. Silverio Decamp  findings felt more consistent with esophageal stricture.  #4 chronic anticoagulation-Eliquis 5.  Atrial fibrillation 6.  Sleep apnea/no oxygen use 7.  Chronic fatigue syndrome 8.  Fibromyalgia 9.  Adult onset diabetes mellitus 10.  Chronic kidney disease stage III  Plan; Patient will be scheduled for colonoscopy and upper endoscopy with probable esophageal dilation with Dr. Silverio Decamp.  Both procedures were discussed in detail with the patient today including indications risks and benefits and she is agreeable to proceed. She will need to hold Eliquis for 48 hours prior to procedures.  We will communicate with her cardiologist Dr. Daneen Schick to assure this is reasonable for this patient. As of today's visit patient is appropriate for endoscopic evaluation in the ambulatory care setting. Continue Nexium 40 mg p.o. twice daily Continue Movantik 12.5 mg daily  Jahan Friedlander S Ryen Heitmeyer PA-C 05/23/2021   Cc: Lajean Manes, MD

## 2021-06-08 ENCOUNTER — Institutional Professional Consult (permissible substitution): Payer: Medicare Other | Admitting: Pulmonary Disease

## 2021-06-08 NOTE — Progress Notes (Deleted)
[facility]  No chief complaint on file.   Past Surgical History:  She  has a past surgical history that includes Cardioversion (07/28/2011); Abdominal hysterectomy; Tubal ligation; Cataract extraction (Left, 10-18-12); Thyroidectomy (1981); Esophagogastroduodenoscopy (egd) with propofol (N/A, 12/03/2012); Balloon dilation (N/A, 12/03/2012); Esophageal manometry (N/A, 05/19/2013); Colonoscopy with propofol (N/A, 03/29/2015); Retinal detachment surgery (Bilateral, 11/2016); and Breast excisional biopsy (Left).  Past Medical History:  Cataract, DM type 2, HLD, Fibromyalgia, GERD, HTN, MVP, Vit D deficiency, Paroxysmal atrial fibrillation  Constitutional:  There were no vitals taken for this visit.  Brief Summary:  [name] is a 76 y.o. female with obstructive sleep apnea.      Subjective:   She was previously seen by Dr. Danton Sewer and Dr. Keturah Barre.    She had a sleep study in 2008 showing moderate sleep apnea, and 2018 showed mild sleep apnea.  She had referral to Dr. Oneal Grout in 2018 to assess for an oral appliance.  Physical Exam:   Appearance - well kempt   ENMT - no sinus tenderness, no oral exudate, no LAN, Mallampati *** airway, no stridor  Respiratory - equal breath sounds bilaterally, no wheezing or rales  CV - s1s2 regular rate and rhythm, no murmurs  Ext - no clubbing, no edema  Skin - no rashes  Psych - normal mood and affect   Pulmonary testing:  PFT 04/29/15 >> FEV1 2.75 (114%), FEV1% 89, TLC 4.73 (91%), DLCO 92%  Sleep Tests:  PSG 06/05/06 >> AHI 20, SpO2 low 76% PSG 09/08/16 >> AHI 14, SpO2 low 80%  Cardiac Tests:  Echo 02/10/16 >> EF 60 to 65%, mild LA dilation  Social History:  She  reports that she has never smoked. She has never used smokeless tobacco. She reports that she does not drink alcohol and does not use drugs.  Family History:  Her family history includes Atrial fibrillation in her  father; Breast cancer in her maternal aunt; Bronchitis in her father; Diabetes type II in her father; Heart disease in her father; Hypertension in her mother; Hyperthyroidism in her mother; Kidney cancer in her sister.     Assessment/Plan:   Obstructive sleep apnea. -   Paroxysmal atrial fibrillation. - followed by Dr. Daneen Schick with Cardiology  Time Spent Involved in Patient Care on Day of Examination:    Follow up:  There are no Patient Instructions on file for this visit.  Medication List:   Allergies as of 06/08/2021       Reactions   Iohexol Other (See Comments)    Desc: EYES & LIPS SWELLING, SOB DURING A MYELOGRAM '88, DECADRON GIVEN/ PRE MEDS REQUIRED/A.C., Onset Date: [id number]   Morphine And Related Other (See Comments)   hyperactivity   Penicillins Hives, Swelling   Has patient had a PCN reaction causing immediate rash, facial/tongue/throat swelling, SOB or lightheadedness with hypotension: Yes Has patient had a PCN reaction causing severe rash involving mucus membranes or skin necrosis: No Has patient had a PCN reaction that required hospitalization No Has patient had a PCN reaction occurring within the last 10 years: No If all of the above answers are "NO", then may proceed with Cephalosporin use.   Shellfish Allergy Hives, Swelling   Hard shellfish   Demerol Nausea And Vomiting   Can take with Phenergan   Victoza [liraglutide]    headaches   Dabigatran Etexilate Mesylate Other (See Comments)   Headaches   Latex Rash   Contact  dermatitis        Medication List        Accurate as of June 08, 2021 11:45 AM. If you have any questions, ask your nurse or doctor.          acetaminophen 500 MG tablet Commonly known as: TYLENOL Take 500 mg by mouth every 6 (six) hours as needed for mild pain or fever.   amiodarone 200 MG tablet Commonly known as: PACERONE Take 0.5 tablets (100 mg total) by mouth daily.   amphetamine-dextroamphetamine 20 MG  tablet Commonly known as: ADDERALL Take 20 mg by mouth 3 (three) times daily.   atorvastatin 80 MG tablet Commonly known as: LIPITOR Take 80 mg by mouth at bedtime.   Basaglar KwikPen 100 UNIT/ML Inject 40 Units into the skin daily.   BD Pen Needle Nano U/F 32G X 4 MM Misc Generic drug: Insulin Pen Needle   buPROPion 300 MG 24 hr tablet Commonly known as: WELLBUTRIN XL Take 300 mg by mouth every morning.   Eliquis 5 MG Tabs tablet Generic drug: apixaban TAKE 1 TABLET TWICE A DAY   esomeprazole 40 MG capsule Commonly known as: NEXIUM TAKE 1 CAPSULE TWICE DAILY BEFORE MEALS   FREESTYLE LITE test strip Generic drug: glucose blood 4 (four) times daily.   furosemide 20 MG tablet Commonly known as: LASIX Take 0.5 tablets (10 mg total) by mouth daily as needed for fluid.   insulin lispro 100 UNIT/ML injection Commonly known as: HUMALOG Inject 15 Units into the skin 3 (three) times daily. Inject 15-25 units into the skin 3 times daily   metoprolol tartrate 25 MG tablet Commonly known as: LOPRESSOR Take 1 tablet (25 mg total) by mouth 2 (two) times daily.   Movantik 12.5 MG Tabs tablet Generic drug: naloxegol oxalate Take 12.5 mg by mouth daily.   OxyCONTIN 30 MG 12 hr tablet Generic drug: oxyCODONE Take 1 tablet by mouth 3 (three) times daily.   oxyCODONE 5 MG immediate release tablet Commonly known as: Oxy IR/ROXICODONE Take 5 mg by mouth every 6 (six) hours as needed (for breakthrough pain).   pregabalin 150 MG capsule Commonly known as: LYRICA Take 150 mg by mouth. 1 in the am and 2 in the pm   sertraline 50 MG tablet Commonly known as: ZOLOFT Take 50 mg by mouth 2 (two) times a day.   tiZANidine 2 MG tablet Commonly known as: ZANAFLEX Take 2 mg by mouth 3 (three) times daily as needed.   Vitamin D 125 MCG (5000 UT) Caps Take 5,000 Units by mouth daily.        Signature:  Chesley Mires, MD [place] Pager - [phone] 06/08/2021, 11:45 AM

## 2021-06-11 ENCOUNTER — Other Ambulatory Visit: Payer: Self-pay | Admitting: Interventional Cardiology

## 2021-06-13 ENCOUNTER — Other Ambulatory Visit: Payer: Self-pay

## 2021-06-13 MED ORDER — METOPROLOL TARTRATE 25 MG PO TABS: 25.0000 mg | ORAL_TABLET | Freq: Two times a day (BID) | ORAL | 1 refills | Status: DC

## 2021-06-13 NOTE — Telephone Encounter (Signed)
Pt last saw Dr Tamala Julian 09/27/20, last labs 02/03/21 Creat 1.40, age 76, weight 92.8kg, based on specified criteria pt is on appropriate dosage of Eliquis 5mg  BID for afib.  Will refill rx.

## 2021-06-15 NOTE — Progress Notes (Signed)
Reviewed and agree with documentation and assessment and plan. K. Veena Dalonte Hardage , MD   

## 2021-06-22 ENCOUNTER — Telehealth: Payer: Self-pay | Admitting: Gastroenterology

## 2021-06-22 ENCOUNTER — Encounter: Payer: Medicare Other | Admitting: Gastroenterology

## 2021-06-22 NOTE — Telephone Encounter (Signed)
Dismissal letter signed. ?

## 2021-06-22 NOTE — Telephone Encounter (Signed)
Called patient, no answer. Left message on machine to have pt call back to R/S. ?

## 2021-06-22 NOTE — Telephone Encounter (Signed)
In Care Everywhere, appears patient saw [name] at [facility] on February 9 after she had a visit with [name] on Jan 30th.   ?She should have informed us if she did not intend to keep this appointment.  Please discharge patient from our practice and will also need to charge no-show fee.  Thank you ?

## 2021-06-23 ENCOUNTER — Telehealth: Payer: Self-pay | Admitting: Gastroenterology

## 2021-06-23 NOTE — Telephone Encounter (Signed)
Patient dismissed from [facility] by Harl Bowie, MD, effective 06/22/21. Dismissal Letter sent out by 1st class mail. [employer]   ?

## 2021-06-24 DIAGNOSIS — R5382 Chronic fatigue, unspecified: Secondary | ICD-10-CM | POA: Diagnosis not present

## 2021-06-24 DIAGNOSIS — E1121 Type 2 diabetes mellitus with diabetic nephropathy: Secondary | ICD-10-CM | POA: Diagnosis not present

## 2021-06-24 DIAGNOSIS — E1165 Type 2 diabetes mellitus with hyperglycemia: Secondary | ICD-10-CM | POA: Diagnosis not present

## 2021-06-24 DIAGNOSIS — I480 Paroxysmal atrial fibrillation: Secondary | ICD-10-CM | POA: Diagnosis not present

## 2021-06-24 DIAGNOSIS — N1831 Chronic kidney disease, stage 3a: Secondary | ICD-10-CM | POA: Diagnosis not present

## 2021-06-24 DIAGNOSIS — M797 Fibromyalgia: Secondary | ICD-10-CM | POA: Diagnosis not present

## 2021-06-24 DIAGNOSIS — E7800 Pure hypercholesterolemia, unspecified: Secondary | ICD-10-CM | POA: Diagnosis not present

## 2021-06-24 DIAGNOSIS — D179 Benign lipomatous neoplasm, unspecified: Secondary | ICD-10-CM | POA: Diagnosis not present

## 2021-06-24 DIAGNOSIS — M810 Age-related osteoporosis without current pathological fracture: Secondary | ICD-10-CM | POA: Diagnosis not present

## 2021-06-24 DIAGNOSIS — Z794 Long term (current) use of insulin: Secondary | ICD-10-CM | POA: Diagnosis not present

## 2021-06-24 DIAGNOSIS — N189 Chronic kidney disease, unspecified: Secondary | ICD-10-CM | POA: Diagnosis not present

## 2021-06-24 DIAGNOSIS — E1142 Type 2 diabetes mellitus with diabetic polyneuropathy: Secondary | ICD-10-CM | POA: Diagnosis not present

## 2021-06-24 DIAGNOSIS — R413 Other amnesia: Secondary | ICD-10-CM | POA: Diagnosis not present

## 2021-06-24 DIAGNOSIS — Z0000 Encounter for general adult medical examination without abnormal findings: Secondary | ICD-10-CM | POA: Diagnosis not present

## 2021-07-03 NOTE — Progress Notes (Signed)
Cardiology Office Note:    Date:  07/04/2021   ID:  [name], DOB 21-Jun-1945, MRN [id number]  PCP:  Lajean Manes, MD  Cardiologist:  Sinclair Grooms, MD   Referring MD: Lajean Manes, MD   Chief Complaint  Patient presents with   Atrial Fibrillation   Follow-up    Anticoagulation Diastolic dysfunction    History of Present Illness:    [name] is a 76 y.o. female with a hx of PAF on amio and Eliquis, CHADS VASC 3, DM, MVP, HLD, fibromyalgia, GERD and OSA.    She is doing well.  She does not have lower extremity swelling, syncope, bleeding on anticoagulation, episodes of atrial fibs, or other complaints.  She does not do her own shopping because she developed fibromyalgia.  Her husband does all the physical work around the house.  She is becoming much less able to participate in regular daily activities although she is independent and ambulatory presently.  Past Medical History:  Diagnosis Date   Cataract    Complication of anesthesia 1975   projectile vomitting   Diabetes mellitus    Dyslipidemia    Dysrhythmia    hx. A.Fib-Cardioversion-converted rhythm   Fibromyalgia    GERD (gastroesophageal reflux disease)    Hypertension    MVP (mitral valve prolapse)    OSA (obstructive sleep apnea) 10-18-12   no cpap used now.   PONV (postoperative nausea and vomiting)    Vitamin D deficiency 10-18-12    Past Surgical History:  Procedure Laterality Date   ABDOMINAL HYSTERECTOMY     '95   BALLOON DILATION N/A 12/03/2012   Procedure: BALLOON DILATION;  Surgeon: [name], MD;  Location: [facility];  Service: Endoscopy;  Laterality: N/A;   BREAST EXCISIONAL BIOPSY Left    CARDIOVERSION  07/28/2011   Procedure: CARDIOVERSION;  Surgeon: Sinclair Grooms, MD;  Location: [facility];  Service: Cardiovascular;  Laterality: N/A;   CATARACT EXTRACTION Left 10-18-12   10'13   COLONOSCOPY WITH PROPOFOL N/A 03/29/2015   Procedure: COLONOSCOPY WITH PROPOFOL;  Surgeon:  [name], MD;  Location: [facility];  Service: Endoscopy;  Laterality: N/A;   ESOPHAGEAL MANOMETRY N/A 05/19/2013   Procedure: ESOPHAGEAL MANOMETRY (EM);  Surgeon: [name], MD;  Location: [facility];  Service: Endoscopy;  Laterality: N/A;   ESOPHAGOGASTRODUODENOSCOPY (EGD) WITH PROPOFOL N/A 12/03/2012   Procedure: ESOPHAGOGASTRODUODENOSCOPY (EGD) WITH Dilation & w/PROPOFOL;  Surgeon: [name], MD;  Location: [facility];  Service: Endoscopy;  Laterality: N/A;   RETINAL DETACHMENT SURGERY Bilateral 11/2016   THYROIDECTOMY  1981   TUBAL LIGATION      Current Medications: Current Meds  Medication Sig   acetaminophen (TYLENOL) 500 MG tablet Take 500 mg by mouth every 6 (six) hours as needed for mild pain or fever.    amiodarone (PACERONE) 200 MG tablet Take 0.5 tablets (100 mg total) by mouth daily.   amphetamine-dextroamphetamine (ADDERALL) 20 MG tablet Take 20 mg by mouth 3 (three) times daily.   atorvastatin (LIPITOR) 80 MG tablet Take 80 mg by mouth at bedtime.   BD PEN NEEDLE NANO U/F 32G X 4 MM MISC    buPROPion (WELLBUTRIN XL) 300 MG 24 hr tablet Take 300 mg by mouth every morning.   Cholecalciferol (VITAMIN D) 125 MCG (5000 UT) CAPS Take 5,000 Units by mouth daily.    ELIQUIS 5 MG TABS tablet TAKE 1 TABLET TWICE A DAY   esomeprazole (NEXIUM) 40 MG capsule  TAKE 1 CAPSULE TWICE DAILY BEFORE MEALS   fluticasone (FLONASE) 50 MCG/ACT nasal spray 1 spray as needed.   FREESTYLE LITE test strip 4 (four) times daily.   furosemide (LASIX) 20 MG tablet Take 0.5 tablets (10 mg total) by mouth daily as needed for fluid.   Insulin Glargine (BASAGLAR KWIKPEN) 100 UNIT/ML SOPN Inject 40 Units into the skin daily.   insulin lispro (HUMALOG) 100 UNIT/ML injection Inject 15 Units into the skin 3 (three) times daily. Inject 15-25 units into the skin 3 times daily   ketoconazole (NIZORAL) 2 % cream as needed.   Melatonin 10 MG TBDP Take 1 capsule by mouth as needed.    metoprolol tartrate (LOPRESSOR) 25 MG tablet Take 1 tablet (25 mg total) by mouth 2 (two) times daily.   MOVANTIK 12.5 MG TABS tablet Take 12.5 mg by mouth daily.   nitroGLYCERIN (NITROSTAT) 0.4 MG SL tablet as needed.   oxyCODONE (OXY IR/ROXICODONE) 5 MG immediate release tablet Take 5 mg by mouth every 6 (six) hours as needed (for breakthrough pain).   OXYCONTIN 30 MG 12 hr tablet Take 1 tablet by mouth 3 (three) times daily.   pregabalin (LYRICA) 150 MG capsule Take 150 mg by mouth. 1 in the am and 2 in the pm   sertraline (ZOLOFT) 100 MG tablet Take 100 mg by mouth 2 (two) times daily.   tiZANidine (ZANAFLEX) 2 MG tablet Take 2 mg by mouth 3 (three) times daily as needed.   [DISCONTINUED] sertraline (ZOLOFT) 50 MG tablet Take 50 mg by mouth 2 (two) times a day.      Allergies:   Iohexol, Morphine and related, Penicillins, Shellfish allergy, Demerol, Rosiglitazone, Victoza [liraglutide], Dabigatran etexilate mesylate, and Latex   Social History   Socioeconomic History   Marital status: Married    Spouse name: Not on file   Number of children: Not on file   Years of education: Not on file   Highest education level: Not on file  Occupational History   Not on file  Tobacco Use   Smoking status: Never   Smokeless tobacco: Never  Vaping Use   Vaping Use: Never used  Substance and Sexual Activity   Alcohol use: No   Drug use: No   Sexual activity: Yes  Other Topics Concern   Not on file  Social History Narrative   Not on file   Social Determinants of Health   Financial Resource Strain: Not on file  Food Insecurity: Not on file  Transportation Needs: Not on file  Physical Activity: Not on file  Stress: Not on file  Social Connections: Not on file     Family History: The patient's family history includes Atrial fibrillation in her father; Breast cancer in her maternal aunt; Bronchitis in her father; Diabetes type II in her father; Heart disease in her father; Hypertension  in her mother; Hyperthyroidism in her mother; Kidney cancer in her sister.  ROS:   Please see the history of present illness.    Upper palate that is causing increasing difficulty with associated pain.  All other systems reviewed and are negative.  EKGs/Labs/Other Studies Reviewed:    The following studies were reviewed today: 2D Doppler echocardiogram 2017: Study Conclusions   - Left ventricle: The cavity size was normal. Wall thickness was    normal. Systolic function was normal. The estimated ejection    fraction was in the range of 60% to 65%. Wall motion was normal;    there were no  regional wall motion abnormalities. Left    ventricular diastolic function parameters were normal.  - Left atrium: The atrium was mildly dilated.  - Atrial septum: No defect or patent foramen ovale was identified.  - Impressions: Normal GLS -17.9.   Impressions:   - Normal GLS -17.9.    EKG:  EKG NSR with normal appearance 09/28/2020.  EKG was not repeated today.  Recent Labs: No results found for requested labs within last 8760 hours.  Recent Lipid Panel No results found for: CHOL, TRIG, HDL, CHOLHDL, VLDL, LDLCALC, LDLDIRECT  Physical Exam:    VS:  BP 100/64    Pulse (!) 58    Ht '5\' 5"'$  (1.651 m)    Wt 204 lb 6.4 oz (92.7 kg)    SpO2 97%    BMI 34.01 kg/m     Wt Readings from Last 3 Encounters:  07/04/21 204 lb 6.4 oz (92.7 kg)  05/23/21 204 lb 8 oz (92.8 kg)  09/27/20 214 lb (97.1 kg)     GEN: Obese. No acute distress HEENT: Normal NECK: No JVD. LYMPHATICS: No lymphadenopathy CARDIAC: No murmur. RRR S4 gallop, or edema. VASCULAR:  Normal Pulses. No bruits. RESPIRATORY:  Clear to auscultation without rales, wheezing or rhonchi  ABDOMEN: Soft, non-tender, non-distended, No pulsatile mass, MUSCULOSKELETAL: No deformity  SKIN: Warm and dry NEUROLOGIC:  Alert and oriented x 3 PSYCHIATRIC:  Normal affect   ASSESSMENT:    1. Paroxysmal atrial fibrillation (HCC)   2. Essential  hypertension   3. Obstructive sleep apnea   4. On amiodarone therapy   5. Anticoagulation goal of INR 2 to 3    PLAN:    In order of problems listed above:  Maintaining normal sinus rhythm.  TSH needs to be done on return in 6 months.   Blood pressure is very well controlled.  I repeated that and it is actually 118/70 mmHg. Encourage compliance with CPAP. Continue low-dose amiodarone 100 mg/day.  TSH and liver panel in 6 months. Continue with anticoagulation using Eliquis.   Medication Adjustments/Labs and Tests Ordered: Current medicines are reviewed at length with the patient today.  Concerns regarding medicines are outlined above.  No orders of the defined types were placed in this encounter.  No orders of the defined types were placed in this encounter.   Patient Instructions  Medication Instructions:  Your physician recommends that you continue on your current medications as directed. Please refer to the Current Medication list given to you today.  *If you need a refill on your cardiac medications before your next appointment, please call your pharmacy*   Lab Work: None If you have labs (blood work) drawn today and your tests are completely normal, you will receive your results only by: [place] (if you have MyChart) OR A paper copy in the mail If you have any lab test that is abnormal or we need to change your treatment, we will call you to review the results.   Testing/Procedures: None   Follow-Up: At [facility], you and your health needs are our priority.  As part of our continuing mission to provide you with exceptional heart care, we have created designated Provider Care Teams.  These Care Teams include your primary Cardiologist (physician) and Advanced Practice Providers (APPs -  Physician Assistants and Nurse Practitioners) who all work together to provide you with the care you need, when you need it.  We recommend signing up for the patient portal  called "MyChart".  Sign up information  is provided on this After Visit Summary.  MyChart is used to connect with patients for Virtual Visits (Telemedicine).  Patients are able to view lab/test results, encounter notes, upcoming appointments, etc.  Non-urgent messages can be sent to your provider as well.   To learn more about what you can do with MyChart, go to [email or web address].    Your next appointment:   6 month(s)  The format for your next appointment:   In Person  Provider:   Sinclair Grooms, MD     Other Instructions     Signed, Sinclair Grooms, MD  07/04/2021 3:42 PM    [place]

## 2021-07-04 ENCOUNTER — Other Ambulatory Visit: Payer: Self-pay

## 2021-07-04 ENCOUNTER — Encounter: Payer: Self-pay | Admitting: Interventional Cardiology

## 2021-07-04 ENCOUNTER — Ambulatory Visit (INDEPENDENT_AMBULATORY_CARE_PROVIDER_SITE_OTHER): Payer: Medicare Other | Admitting: Interventional Cardiology

## 2021-07-04 VITALS — BP 100/64 | HR 58 | Ht 65.0 in | Wt 204.4 lb

## 2021-07-04 DIAGNOSIS — G4733 Obstructive sleep apnea (adult) (pediatric): Secondary | ICD-10-CM | POA: Diagnosis not present

## 2021-07-04 DIAGNOSIS — Z7901 Long term (current) use of anticoagulants: Secondary | ICD-10-CM

## 2021-07-04 DIAGNOSIS — I480 Paroxysmal atrial fibrillation: Secondary | ICD-10-CM | POA: Diagnosis not present

## 2021-07-04 DIAGNOSIS — Z5181 Encounter for therapeutic drug level monitoring: Secondary | ICD-10-CM

## 2021-07-04 DIAGNOSIS — Z79899 Other long term (current) drug therapy: Secondary | ICD-10-CM | POA: Diagnosis not present

## 2021-07-04 DIAGNOSIS — I10 Essential (primary) hypertension: Secondary | ICD-10-CM | POA: Diagnosis not present

## 2021-07-04 NOTE — Patient Instructions (Signed)
Medication Instructions:  ?Your physician recommends that you continue on your current medications as directed. Please refer to the Current Medication list given to you today. ? ?*If you need a refill on your cardiac medications before your next appointment, please call your pharmacy* ? ? ?Lab Work: ?None ?If you have labs (blood work) drawn today and your tests are completely normal, you will receive your results only by: ?MyChart Message (if you have MyChart) OR ?A paper copy in the mail ?If you have any lab test that is abnormal or we need to change your treatment, we will call you to review the results. ? ? ?Testing/Procedures: ?None ? ? ?Follow-Up: ?At [facility], you and your health needs are our priority.  As part of our continuing mission to provide you with exceptional heart care, we have created designated Provider Care Teams.  These Care Teams include your primary Cardiologist (physician) and Advanced Practice Providers (APPs -  Physician Assistants and Nurse Practitioners) who all work together to provide you with the care you need, when you need it. ? ?We recommend signing up for the patient portal called "MyChart".  Sign up information is provided on this After Visit Summary.  MyChart is used to connect with patients for Virtual Visits (Telemedicine).  Patients are able to view lab/test results, encounter notes, upcoming appointments, etc.  Non-urgent messages can be sent to your provider as well.   ?To learn more about what you can do with MyChart, go to [email or web address].   ? ?Your next appointment:   ?6 month(s) ? ?The format for your next appointment:   ?In Person ? ?Provider:   ?Henry W Smith III, MD  ? ? ?Other Instructions ?  ?

## 2021-07-11 DIAGNOSIS — L858 Other specified epidermal thickening: Secondary | ICD-10-CM | POA: Diagnosis not present

## 2021-07-11 DIAGNOSIS — L738 Other specified follicular disorders: Secondary | ICD-10-CM | POA: Diagnosis not present

## 2021-07-11 DIAGNOSIS — Z7189 Other specified counseling: Secondary | ICD-10-CM | POA: Diagnosis not present

## 2021-07-11 DIAGNOSIS — I788 Other diseases of capillaries: Secondary | ICD-10-CM | POA: Diagnosis not present

## 2021-07-11 DIAGNOSIS — D2239 Melanocytic nevi of other parts of face: Secondary | ICD-10-CM | POA: Diagnosis not present

## 2021-07-11 DIAGNOSIS — L821 Other seborrheic keratosis: Secondary | ICD-10-CM | POA: Diagnosis not present

## 2021-07-11 DIAGNOSIS — M2041 Other hammer toe(s) (acquired), right foot: Secondary | ICD-10-CM | POA: Diagnosis not present

## 2021-07-11 DIAGNOSIS — D225 Melanocytic nevi of trunk: Secondary | ICD-10-CM | POA: Diagnosis not present

## 2021-07-11 DIAGNOSIS — D1724 Benign lipomatous neoplasm of skin and subcutaneous tissue of left leg: Secondary | ICD-10-CM | POA: Diagnosis not present

## 2021-07-11 DIAGNOSIS — C4401 Basal cell carcinoma of skin of lip: Secondary | ICD-10-CM | POA: Diagnosis not present

## 2021-07-11 DIAGNOSIS — D2262 Melanocytic nevi of left upper limb, including shoulder: Secondary | ICD-10-CM | POA: Diagnosis not present

## 2021-07-11 DIAGNOSIS — D485 Neoplasm of uncertain behavior of skin: Secondary | ICD-10-CM | POA: Diagnosis not present

## 2021-07-11 DIAGNOSIS — L853 Xerosis cutis: Secondary | ICD-10-CM | POA: Diagnosis not present

## 2021-07-27 ENCOUNTER — Ambulatory Visit: Payer: Medicare Other | Admitting: Podiatry

## 2021-08-09 ENCOUNTER — Encounter: Payer: Medicare Other | Admitting: Gastroenterology

## 2021-08-09 DIAGNOSIS — C4401 Basal cell carcinoma of skin of lip: Secondary | ICD-10-CM | POA: Diagnosis not present

## 2021-08-09 DIAGNOSIS — N1832 Chronic kidney disease, stage 3b: Secondary | ICD-10-CM | POA: Diagnosis not present

## 2021-08-10 ENCOUNTER — Ambulatory Visit: Payer: Medicare Other | Admitting: Podiatry

## 2021-08-10 ENCOUNTER — Ambulatory Visit (INDEPENDENT_AMBULATORY_CARE_PROVIDER_SITE_OTHER): Payer: Medicare Other | Admitting: Pulmonary Disease

## 2021-08-10 ENCOUNTER — Encounter: Payer: Self-pay | Admitting: Pulmonary Disease

## 2021-08-10 VITALS — BP 124/78 | HR 60 | Ht 65.0 in | Wt 200.0 lb

## 2021-08-10 DIAGNOSIS — G4733 Obstructive sleep apnea (adult) (pediatric): Secondary | ICD-10-CM

## 2021-08-10 MED ORDER — FLUTICASONE PROPIONATE 50 MCG/ACT NA SUSP
1.0000 | Freq: Every day | NASAL | 3 refills | Status: AC
Start: 2021-08-10 — End: ?

## 2021-08-10 MED ORDER — AZELASTINE HCL 0.1 % NA SOLN: 1.0000 | Freq: Every day | NASAL | 3 refills | Status: DC

## 2021-08-10 NOTE — Patient Instructions (Signed)
Try using flonase and azelastine to help with sinus congestion and allergies ? ?Will arrange for home sleep study ? ?Will call to arrange for follow up after sleep study reviewed ? ?

## 2021-08-10 NOTE — Progress Notes (Signed)
h ? ?[facility] ? ?Chief Complaint  ?Patient presents with  ? Consult  ?  Has apnea not on C Pap   ? ? ?Past Surgical History:  ?She  has a past surgical history that includes Cardioversion (07/28/2011); Abdominal hysterectomy; Tubal ligation; Cataract extraction (Left, 10-18-12); Thyroidectomy (1981); Esophagogastroduodenoscopy (egd) with propofol (N/A, 12/03/2012); Balloon dilation (N/A, 12/03/2012); Esophageal manometry (N/A, 05/19/2013); Colonoscopy with propofol (N/A, 03/29/2015); Retinal detachment surgery (Bilateral, 11/2016); and Breast excisional biopsy (Left). ? ?Past Medical History:  ?Cataract, DM type 2, HLD, A fib, Fibromyalgia, GERD, HTN, MVP, Vit D deficiency ? ?Constitutional:  ?BP 124/78 (BP Location: Right Arm, Cuff Size: Normal)   Pulse 60   Ht '5\' 5"'$  (1.651 m)   Wt 200 lb (90.7 kg)   SpO2 92%   BMI 33.28 kg/m?  ? ?Brief Summary:  ?[name] is a 76 y.o. female with obstructive sleep apnea. ?  ? ? ? ?Subjective:  ? ?She is here with her husband. ? ?She was seen previously by Dr. Gwenette Greet and Dr. Annamaria Boots. ? ?Most recent sleep study showed mild to moderate obstructive sleep apnea.  She was tried on CPAP.  Had trouble getting proper fitting mask.  She drools and would have trouble with liquid build up in her mask.  She hasn't used CPAP for several years. ? ?She has been getting more daytime sleepiness and fatigue.  She is having trouble staying asleep.  She snores.  She falls asleep easily when sitting quiet.  She is concerned about how sleep apnea can impact her atrial fibrillation. ? ?She goes to sleep at 11 pm.  She falls asleep in 20 minutes.  She wakes up 1 or 2 times to use the bathroom.  She gets out of bed at 9 am.  She feels tired in the morning.  She denies morning headache.  She does not use anything to help her fall sleep or stay awake. ? ?She denies sleep walking, sleep talking, bruxism, or nightmares.  There is no history of restless legs.  She  denies sleep hallucinations, sleep paralysis, or cataplexy. ? ?The Epworth score is 9 out of 24. ? ? ?Physical Exam:  ? ?Appearance - well kempt  ? ?ENMT - no sinus tenderness, no oral exudate, no LAN, Mallampati 3 airway, no stridor, wears dentures, narrow nasal passages ? ?Respiratory - equal breath sounds bilaterally, no wheezing or rales ? ?CV - s1s2 regular rate and rhythm, no murmurs ? ?Ext - no clubbing, no edema ? ?Skin - no rashes ? ?Psych - normal mood and affect ?  ?Pulmonary testing:  ?PFT 04/29/15 >> FEV1 2.75 (114%), FEV1% 89, TLC 4.73 (91%), DLCO 92% ? ?Sleep Tests:  ?PSG 06/05/16 >> AHI 20, SpO2 low 78% ?PSG 09/08/16 >> AHI 14, SpO2 low 80% ? ?Cardiac Tests:  ?Echo 02/10/16 >> EF 60 to 65% ? ?Social History:  ?She  reports that she has never smoked. She has never used smokeless tobacco. She reports that she does not drink alcohol and does not use drugs. ? ?Family History:  ?Her family history includes Atrial fibrillation in her father; Breast cancer in her maternal aunt; Bronchitis in her father; Diabetes type II in her father; Heart disease in her father; Hypertension in her mother; Hyperthyroidism in her mother; Kidney cancer in her sister. ?  ? ?Discussion:  ?She has prior history of sleep apnea, but had trouble using CPAP due to mask fit.  She has history of atrial fibrillation,  and hypertension.  She has persistent daytime sleepiness, snoring, apnea and sleep disruption.  I am concerned she could still have obstructive sleep apnea. ? ?Assessment/Plan:  ? ?Snoring with excessive daytime sleepiness. ?- will need to arrange for a home sleep study ?- she would like to look into getting an Inspire device if she is a suitable candidate for this ? ?Allergic rhinitis. ?- will have her try nasal irrigation, flonase and azelastine ? ?Obesity. ?- discussed how weight can impact sleep and risk for sleep disordered breathing ?- discussed options to assist with weight loss: combination of diet modification,  cardiovascular and strength training exercises ? ?Cardiovascular risk. ?- had an extensive discussion regarding the adverse health consequences related to untreated sleep disordered breathing ?- specifically discussed the risks for hypertension, coronary artery disease, cardiac dysrhythmias, cerebrovascular disease, and diabetes ?- lifestyle modification discussed ? ?Safe driving practices. ?- discussed how sleep disruption can increase risk of accidents, particularly when driving ?- safe driving practices were discussed ? ?Therapies for obstructive sleep apnea. ?- if the sleep study shows significant sleep apnea, then various therapies for treatment were reviewed: CPAP, oral appliance, and surgical interventions ? ? ? ?Time Spent Involved in Patient Care on Day of Examination:  ?38 minutes ? ?Follow up:  ? ?Patient Instructions  ?Try using flonase and azelastine to help with sinus congestion and allergies ? ?Will arrange for home sleep study ? ?Will call to arrange for follow up after sleep study reviewed ? ? ?Medication List:  ? ?Allergies as of 08/10/2021   ? ?   Reactions  ? Iohexol Other (See Comments)  ?  Desc: EYES & LIPS SWELLING, SOB DURING A MYELOGRAM '88, DECADRON GIVEN/ PRE MEDS REQUIRED/A.C., Onset Date: [id number]  ? Morphine And Related Other (See Comments)  ? hyperactivity  ? Penicillins Hives, Swelling  ? Has patient had a PCN reaction causing immediate rash, facial/tongue/throat swelling, SOB or lightheadedness with hypotension: Yes ?Has patient had a PCN reaction causing severe rash involving mucus membranes or skin necrosis: No ?Has patient had a PCN reaction that required hospitalization No ?Has patient had a PCN reaction occurring within the last 10 years: No ?If all of the above answers are "NO", then may proceed with Cephalosporin use.  ? Shellfish Allergy Hives, Swelling  ? Hard shellfish  ? Demerol Nausea And Vomiting  ? Can take with Phenergan  ? Rosiglitazone   ? Other reaction(s): Weight  gain and edema  ? Victoza [liraglutide]   ? headaches  ? Dabigatran Etexilate Mesylate Other (See Comments)  ? Headaches  ? Latex Rash  ? Contact dermatitis  ? ?  ? ?  ?Medication List  ?  ? ?  ? Accurate as of August 10, 2021  5:01 PM. If you have any questions, ask your nurse or doctor.  ?  ?  ? ?  ? ?acetaminophen 500 MG tablet ?Commonly known as: TYLENOL ?Take 500 mg by mouth every 6 (six) hours as needed for mild pain or fever. ?  ?amiodarone 200 MG tablet ?Commonly known as: PACERONE ?Take 0.5 tablets (100 mg total) by mouth daily. ?  ?amphetamine-dextroamphetamine 20 MG tablet ?Commonly known as: ADDERALL ?Take 20 mg by mouth 3 (three) times daily. ?  ?atorvastatin 80 MG tablet ?Commonly known as: LIPITOR ?Take 80 mg by mouth at bedtime. ?  ?azelastine 0.1 % nasal spray ?Commonly known as: ASTELIN ?Place 1 spray into both nostrils daily. Use in each nostril as directed ?Started by: Chesley Mires, MD ?  ?  Basaglar KwikPen 100 UNIT/ML ?Inject 40 Units into the skin daily. ?  ?BD Pen Needle Nano U/F 32G X 4 MM Misc ?Generic drug: Insulin Pen Needle ?  ?buPROPion 300 MG 24 hr tablet ?Commonly known as: WELLBUTRIN XL ?Take 300 mg by mouth every morning. ?  ?Eliquis 5 MG Tabs tablet ?Generic drug: apixaban ?TAKE 1 TABLET TWICE A DAY ?  ?esomeprazole 40 MG capsule ?Commonly known as: [place] ?TAKE 1 CAPSULE TWICE DAILY BEFORE MEALS ?  ?fluticasone 50 MCG/ACT nasal spray ?Commonly known as: FLONASE ?Place 1 spray into both nostrils daily. ?What changed:  ?how to take this ?when to take this ?reasons to take this ?Changed by: Chesley Mires, MD ?  ?FREESTYLE LITE test strip ?Generic drug: glucose blood ?4 (four) times daily. ?  ?furosemide 20 MG tablet ?Commonly known as: LASIX ?Take 0.5 tablets (10 mg total) by mouth daily as needed for fluid. ?  ?insulin lispro 100 UNIT/ML injection ?Commonly known as: HUMALOG ?Inject 15 Units into the skin 3 (three) times daily. Inject 15-25 units into the skin 3 times daily ?   ?ketoconazole 2 % cream ?Commonly known as: NIZORAL ?as needed. ?  ?Melatonin 10 MG Tbdp ?Take 1 capsule by mouth as needed. ?  ?metoprolol tartrate 25 MG tablet ?Commonly known as: LOPRESSOR ?Take 1 tablet (25 mg total) b

## 2021-08-11 DIAGNOSIS — C4401 Basal cell carcinoma of skin of lip: Secondary | ICD-10-CM | POA: Diagnosis not present

## 2021-08-12 DIAGNOSIS — C4401 Basal cell carcinoma of skin of lip: Secondary | ICD-10-CM | POA: Diagnosis not present

## 2021-08-15 DIAGNOSIS — C4401 Basal cell carcinoma of skin of lip: Secondary | ICD-10-CM | POA: Diagnosis not present

## 2021-08-16 DIAGNOSIS — C4401 Basal cell carcinoma of skin of lip: Secondary | ICD-10-CM | POA: Diagnosis not present

## 2021-08-17 DIAGNOSIS — C4401 Basal cell carcinoma of skin of lip: Secondary | ICD-10-CM | POA: Diagnosis not present

## 2021-08-18 DIAGNOSIS — E1122 Type 2 diabetes mellitus with diabetic chronic kidney disease: Secondary | ICD-10-CM | POA: Diagnosis not present

## 2021-08-18 DIAGNOSIS — M1990 Unspecified osteoarthritis, unspecified site: Secondary | ICD-10-CM | POA: Diagnosis not present

## 2021-08-18 DIAGNOSIS — N2581 Secondary hyperparathyroidism of renal origin: Secondary | ICD-10-CM | POA: Diagnosis not present

## 2021-08-18 DIAGNOSIS — K220 Achalasia of cardia: Secondary | ICD-10-CM | POA: Diagnosis not present

## 2021-08-18 DIAGNOSIS — E875 Hyperkalemia: Secondary | ICD-10-CM | POA: Diagnosis not present

## 2021-08-18 DIAGNOSIS — N1832 Chronic kidney disease, stage 3b: Secondary | ICD-10-CM | POA: Diagnosis not present

## 2021-08-19 ENCOUNTER — Ambulatory Visit (INDEPENDENT_AMBULATORY_CARE_PROVIDER_SITE_OTHER): Payer: Medicare Other

## 2021-08-19 ENCOUNTER — Ambulatory Visit (INDEPENDENT_AMBULATORY_CARE_PROVIDER_SITE_OTHER): Payer: Medicare Other | Admitting: Podiatry

## 2021-08-19 DIAGNOSIS — B351 Tinea unguium: Secondary | ICD-10-CM | POA: Diagnosis not present

## 2021-08-19 DIAGNOSIS — Z79899 Other long term (current) drug therapy: Secondary | ICD-10-CM | POA: Diagnosis not present

## 2021-08-19 DIAGNOSIS — M21612 Bunion of left foot: Secondary | ICD-10-CM

## 2021-08-19 DIAGNOSIS — M2012 Hallux valgus (acquired), left foot: Secondary | ICD-10-CM | POA: Diagnosis not present

## 2021-08-19 DIAGNOSIS — M21619 Bunion of unspecified foot: Secondary | ICD-10-CM

## 2021-08-19 DIAGNOSIS — Z01818 Encounter for other preprocedural examination: Secondary | ICD-10-CM | POA: Diagnosis not present

## 2021-08-20 DIAGNOSIS — C4401 Basal cell carcinoma of skin of lip: Secondary | ICD-10-CM | POA: Diagnosis not present

## 2021-08-22 ENCOUNTER — Telehealth: Payer: Self-pay | Admitting: Interventional Cardiology

## 2021-08-22 DIAGNOSIS — C4401 Basal cell carcinoma of skin of lip: Secondary | ICD-10-CM | POA: Diagnosis not present

## 2021-08-22 NOTE — Telephone Encounter (Signed)
Patient with diagnosis of afib on Eliquis for anticoagulation.   ? ?Procedure: Lapidus Bunionectomy on left foot with aching osteotomy ?Date of procedure: 10/03/21 ? ?CHA2DS2-VASc Score = 4  ?This indicates a 4.8% annual risk of stroke. ?The patient's score is based upon: ?CHF History: 0 ?HTN History: 1 ?Diabetes History: 1 ?Stroke History: 0 ?Vascular Disease History: 0 ?Age Score: 1 ?Gender Score: 1 ?  ?CrCl 31m/min using adjusted body weight ?Platelet count 318K ? ?Per office protocol, patient can hold Eliquis for 2 days prior to procedure.   ?

## 2021-08-22 NOTE — Telephone Encounter (Signed)
? ? ?  Name: [name]  ?DOB: [birth year]  ?MRN: [id number] ? ?Primary Cardiologist: Sinclair Grooms, MD ? ? ?Preoperative team, please contact this patient and set up a phone call appointment for further preoperative risk assessment. Please obtain consent and complete medication review. Thank you for your help. ? ?I confirm that guidance regarding antiplatelet and oral anticoagulation therapy has been completed and, if necessary, noted below. ? ? ? ?Charlie Pitter, PA-C ?08/22/2021, 4:33 PM ?[facility] ?[address] ?[place], [place] [address] ? ? ?

## 2021-08-22 NOTE — Telephone Encounter (Signed)
? ?  Pre-operative Risk Assessment  ?  ?Patient Name: [name]  ?DOB: Aug 23, 1945 ?MRN: [id number]  ? ?  ? ?Request for Surgical Clearance   ? ?Procedure:   Lapidus Bunionectomy on left foot with aching osteotomy ? ?Date of Surgery:  Clearance 10/03/21                              ?   ?Surgeon:  Dr. Boneta Lucks  ?Surgeon's Group or Practice Name:  [facility] ?Phone number:  [phone] ?Fax number:  [phone] ?  ?Type of Clearance Requested:   ?- Medical  ?- Pharmacy:  Hold TBD  by Cardiology ?  ?Type of Anesthesia:   Anesthesiologist  choice ?  ?Additional requests/questions:   ? ?Signed, ?Trilby Drummer   ?08/22/2021, 1:34 PM   ?

## 2021-08-22 NOTE — Telephone Encounter (Signed)
Will route to pharm for input on anticoag. Has eliquis on list. Needs tele visit after. Last OV 06/2021.  ?

## 2021-08-23 NOTE — Telephone Encounter (Signed)
Left message to call back for tele appt 

## 2021-08-25 NOTE — Telephone Encounter (Signed)
Left message for the patient to contact the office. 

## 2021-08-26 DIAGNOSIS — C4401 Basal cell carcinoma of skin of lip: Secondary | ICD-10-CM | POA: Diagnosis not present

## 2021-08-26 NOTE — Progress Notes (Signed)
?Subjective:  ?Patient ID: [name], female    DOB: [birth year],  MRN: [id number] ? ?Chief Complaint  ?Patient presents with  ? Diabetes  ? ? ?76 y.o. female presents with the above complaint.  Patient presents with complaint of left severe bunion deformity.  She states is painful to touch is progressive gotten worse.  She said hurts with ambulation she is tried shoe gear modification padding protecting offloading none of which has helped.  She has failed all conservative treatment option would like to discuss surgical options at this time.  Pain scale 7 out of 10 hurts with ambulation.  She also has secondary complaint bilateral second digit onychomycosis.  She states is thickened elongated dystrophic mycotic toenails she would like to discuss treatment options for this.  She denies any other acute issues ? ?Review of Systems: Negative except as noted in the HPI. Denies N/V/F/Ch. ? ?Past Medical History:  ?Diagnosis Date  ? Cataract   ? Complication of anesthesia 1975  ? projectile vomitting  ? Diabetes mellitus   ? Dyslipidemia   ? Dysrhythmia   ? hx. A.Fib-Cardioversion-converted rhythm  ? Fibromyalgia   ? GERD (gastroesophageal reflux disease)   ? Hypertension   ? MVP (mitral valve prolapse)   ? OSA (obstructive sleep apnea) 10-18-12  ? no cpap used now.  ? PONV (postoperative nausea and vomiting)   ? Vitamin D deficiency 10-18-12  ? ? ?Current Outpatient Medications:  ?  acetaminophen (TYLENOL) 500 MG tablet, Take 500 mg by mouth every 6 (six) hours as needed for mild pain or fever. , Disp: , Rfl:  ?  amiodarone (PACERONE) 200 MG tablet, Take 0.5 tablets (100 mg total) by mouth daily., Disp: 45 tablet, Rfl: 3 ?  amphetamine-dextroamphetamine (ADDERALL) 20 MG tablet, Take 20 mg by mouth 3 (three) times daily., Disp: , Rfl:  ?  atorvastatin (LIPITOR) 80 MG tablet, Take 80 mg by mouth at bedtime., Disp: , Rfl:  ?  azelastine (ASTELIN) 0.1 % nasal spray, Place 1 spray into both nostrils daily. Use in each  nostril as directed, Disp: 90 mL, Rfl: 3 ?  BD PEN NEEDLE NANO U/F 32G X 4 MM MISC, , Disp: , Rfl:  ?  buPROPion (WELLBUTRIN XL) 300 MG 24 hr tablet, Take 300 mg by mouth every morning., Disp: , Rfl:  ?  Cholecalciferol (VITAMIN D) 125 MCG (5000 UT) CAPS, Take 5,000 Units by mouth daily. , Disp: , Rfl:  ?  ELIQUIS 5 MG TABS tablet, TAKE 1 TABLET TWICE A DAY, Disp: 180 tablet, Rfl: 1 ?  esomeprazole (NEXIUM) 40 MG capsule, TAKE 1 CAPSULE TWICE DAILY BEFORE MEALS, Disp: 180 capsule, Rfl: 3 ?  fluticasone (FLONASE) 50 MCG/ACT nasal spray, Place 1 spray into both nostrils daily., Disp: 48 g, Rfl: 3 ?  FREESTYLE LITE test strip, 4 (four) times daily., Disp: , Rfl:  ?  furosemide (LASIX) 20 MG tablet, Take 0.5 tablets (10 mg total) by mouth daily as needed for fluid., Disp: 45 tablet, Rfl: 3 ?  Insulin Glargine (BASAGLAR KWIKPEN) 100 UNIT/ML SOPN, Inject 40 Units into the skin daily., Disp: , Rfl:  ?  insulin lispro (HUMALOG) 100 UNIT/ML injection, Inject 15 Units into the skin 3 (three) times daily. Inject 15-25 units into the skin 3 times daily, Disp: , Rfl:  ?  ketoconazole (NIZORAL) 2 % cream, as needed., Disp: , Rfl:  ?  Melatonin 10 MG TBDP, Take 1 capsule by mouth as needed., Disp: , Rfl:  ?  metoprolol tartrate (LOPRESSOR) 25 MG tablet, Take 1 tablet (25 mg total) by mouth 2 (two) times daily., Disp: 180 tablet, Rfl: 1 ?  MOVANTIK 12.5 MG TABS tablet, Take 12.5 mg by mouth daily., Disp: , Rfl: 5 ?  nitroGLYCERIN (NITROSTAT) 0.4 MG SL tablet, as needed., Disp: , Rfl:  ?  oxyCODONE (OXY IR/ROXICODONE) 5 MG immediate release tablet, Take 5 mg by mouth every 6 (six) hours as needed (for breakthrough pain)., Disp: , Rfl:  ?  OXYCONTIN 30 MG 12 hr tablet, Take 1 tablet by mouth 3 (three) times daily. (Patient not taking: Reported on 08/10/2021), Disp: , Rfl:  ?  pregabalin (LYRICA) 150 MG capsule, Take 150 mg by mouth. 1 in the am and 2 in the pm, Disp: , Rfl:  ?  sertraline (ZOLOFT) 100 MG tablet, Take 100 mg by mouth 2  (two) times daily., Disp: , Rfl:  ?  tiZANidine (ZANAFLEX) 2 MG tablet, Take 2 mg by mouth 3 (three) times daily as needed., Disp: , Rfl:  ? ?Social History  ? ?Tobacco Use  ?Smoking Status Never  ?Smokeless Tobacco Never  ? ? ?Allergies  ?Allergen Reactions  ? Iohexol Other (See Comments)  ?   Desc: EYES & LIPS SWELLING, SOB DURING A MYELOGRAM '88, DECADRON GIVEN/ PRE MEDS REQUIRED/A.C., Onset Date: [id number] ?  ? Morphine And Related Other (See Comments)  ?  hyperactivity  ? Penicillins Hives and Swelling  ?  Has patient had a PCN reaction causing immediate rash, facial/tongue/throat swelling, SOB or lightheadedness with hypotension: Yes ?Has patient had a PCN reaction causing severe rash involving mucus membranes or skin necrosis: No ?Has patient had a PCN reaction that required hospitalization No ?Has patient had a PCN reaction occurring within the last 10 years: No ?If all of the above answers are "NO", then may proceed with Cephalosporin use. ?  ? Shellfish Allergy Hives and Swelling  ?  Hard shellfish  ? Demerol Nausea And Vomiting  ?  Can take with Phenergan  ? Rosiglitazone   ?  Other reaction(s): Weight gain and edema  ? Victoza [Liraglutide]   ?  headaches  ? Dabigatran Etexilate Mesylate Other (See Comments)  ?  Headaches ?  ? Latex Rash  ?  Contact dermatitis  ? ?Objective:  ?There were no vitals filed for this visit. ?There is no height or weight on file to calculate BMI. ?Constitutional Well developed. ?Well nourished.  ?Vascular Dorsalis pedis pulses palpable bilaterally. ?Posterior tibial pulses palpable bilaterally. ?Capillary refill normal to all digits.  ?No cyanosis or clubbing noted. ?Pedal hair growth normal.  ?Neurologic Normal speech. ?Oriented to person, place, and time. ?Epicritic sensation to light touch grossly present bilaterally.  ?Dermatologic Nails well groomed and normal in appearance. ?No open wounds. ?No skin lesions.  ?Orthopedic: Normal joint ROM without pain or crepitus  bilaterally. ?Hallux abductovalgus deformity present.  This is a track bound not a tracking deformity.  No intra-articular first MPJ pain noted.  Severe bunion deformity noted. ?Left 1st MPJ diminished range of motion. ?Left 1st TMT with gross hypermobility. ?Right 1st MPJ diminished range of motion  ?Right 1st TMT without gross hypermobility. ?Lesser digital contractures absent bilaterally.  ? ?Radiographs: ?Taken and reviewed. Hallux abductovalgus deformity present. Metatarsal parabola normal. 1st/2nd IMA: Severe greater than 15 degrees; TSP: 6 out of 7.  Hallux valgus deformity noted.  No first MPJ arthritis noted. ?Assessment:  ? ?1. Hav (hallux abducto valgus), left   ?2. Long-term use of high-risk medication   ?  3. Nail fungus   ?4. Onychomycosis due to dermatophyte   ?5. Encounter for preoperative examination for general surgical procedure   ? ?Plan:  ?Patient was evaluated and treated and all questions answered. ? ?Hallux abductovalgus deformity, left ?-XR as above. ?-Patient has failed all conservative therapy and wishes to proceed with surgical intervention. All risks, benefits, and alternatives discussed with patient. No guarantees given. Consent reviewed and signed by patient. Post-op course explained at length. ?-Planned procedures: Lapiplasty/Lapidus first tarsometatarsal joint fusion. ?-Risk factors: Age ?-Cam boot was dispensed. ?-I explained to the patient my preoperative intraoperative postoperative surgical plans in extensive detail.  She would benefit from a Lapidus bunion fusion with a possible phalangeal osteotomy if indicated.  I discussed with her that she will be nonweightbearing for the first 3 weeks followed by weightbearing as tolerated in surgical shoe.  She states understanding. ? ?Bilateral second digit onychomycosis ?-Educated the patient on the etiology of onychomycosis and various treatment options associated with improving the fungal load.  I explained to the patient that there is 3  treatment options available to treat the onychomycosis including topical, p.o., laser treatment.  Patient elected to undergo p.o. options with Lamisil/terbinafine therapy.  In order for me to start the [facility]

## 2021-08-26 NOTE — Telephone Encounter (Signed)
Spoke with patient who states she is going to wait until September to have her surgery. She states she has a cruise scheduled and she will not be healed to go and she does not want to miss it.  ? ?Contacted her surgeons office and made them aware that the patient dos not want to do surgery until September. Surgery scheduler voiced understanding and stated she would contact the patient.  ?

## 2021-08-29 DIAGNOSIS — E119 Type 2 diabetes mellitus without complications: Secondary | ICD-10-CM | POA: Diagnosis not present

## 2021-08-29 DIAGNOSIS — H26491 Other secondary cataract, right eye: Secondary | ICD-10-CM | POA: Diagnosis not present

## 2021-08-29 DIAGNOSIS — Z794 Long term (current) use of insulin: Secondary | ICD-10-CM | POA: Diagnosis not present

## 2021-08-30 DIAGNOSIS — C4401 Basal cell carcinoma of skin of lip: Secondary | ICD-10-CM | POA: Diagnosis not present

## 2021-09-01 DIAGNOSIS — C4401 Basal cell carcinoma of skin of lip: Secondary | ICD-10-CM | POA: Diagnosis not present

## 2021-09-02 ENCOUNTER — Ambulatory Visit: Payer: Self-pay | Admitting: Surgery

## 2021-09-02 DIAGNOSIS — C4401 Basal cell carcinoma of skin of lip: Secondary | ICD-10-CM | POA: Diagnosis not present

## 2021-09-02 DIAGNOSIS — R229 Localized swelling, mass and lump, unspecified: Secondary | ICD-10-CM | POA: Diagnosis not present

## 2021-09-02 NOTE — H&P (Signed)
?[name] ?[id number]  ? ?Referring Provider:  Mathews Argyle, * ? ? ?Subjective  ? ?Chief Complaint: New Consultation (Enlarging Lipoma, Left Thigh) ?  ? ? ?History of Present Illness: ?   ?A pleasant 76 year old woman presents with an enlarging mass of the left thigh.  She had previously seen Dr. Lucia Gaskins about this at which time he documented approximately 4 x 6 cm.  It has slowly grown over time.  She occasionally bumps it with things and it is tender and occasionally bruises.  She is interested in having this removed. ? ?Review of Systems: ?A complete review of systems was obtained from the patient.  I have reviewed this information and discussed as appropriate with the patient.  See HPI as well for other ROS. ? ? ?Medical History: ?Past Medical History:  ?Diagnosis Date  ? Arthritis   ? OA and fibromalgia  ? Atrial fibrillation (CMS-HCC)   ? Bleeding disorder (CMS-HCC)   ? on blood thinner off for 2 days  ? Diabetes mellitus type 2, uncomplicated (CMS-HCC)   ? GERD (gastroesophageal reflux disease)   ? Hypertension   ? Kidney disease   ? stage 3 but stable  ? Pneumonia, unspecified organism   ? PONV (postoperative nausea and vomiting)   ? Sleep apnea   ? does not use cpap machine  overall AHI 14/hr  ? Thyroid disease   ? benign tumor  ? ? ?Patient Active Problem List  ?Diagnosis  ? Hallux valgus, bilateral  ? Abnormal gait  ? Achalasia  ? Acute kidney injury superimposed on chronic kidney disease (CMS-HCC)  ? Allergic rhinitis  ? Anticoagulation goal of INR 2 to 3  ? Anxiety  ? Bilateral carpal tunnel syndrome  ? Body mass index (BMI) 40.0-44.9, adult (CMS-HCC)  ? Chronic constipation  ? Chronic fatigue syndrome  ? Chronic kidney disease  ? Chronic kidney disease, stage 3a (CMS-HCC)  ? Closed fracture of fourth metatarsal bone of right foot  ? Closed fracture of second metatarsal bone  ? Combined hyperlipidemia  ? Diabetic nephropathy (CMS-HCC)  ? Essential hypertension  ? Fibromyalgia  ?  Impingement syndrome of right shoulder region  ? Laceration of finger of left hand  ? Long term (current) use of insulin (CMS-HCC)  ? Memory loss  ? Mitral valve disorder  ? Moderate recurrent major depression (CMS-HCC)  ? Morbid obesity (CMS-HCC)  ? Muscle jerks during sleep  ? Obstructive sleep apnea  ? On amiodarone therapy  ? Osteoporosis  ? Pain in left knee  ? Pain of left hand  ? Paroxysmal atrial fibrillation (CMS-HCC)  ? Polyneuropathy due to type 2 diabetes mellitus (CMS-HCC)  ? Pure hypercholesterolemia  ? Rosacea  ? Thrombophilia (CMS-HCC)  ? Type 2 diabetes mellitus without complication, with long-term current use of insulin (CMS-HCC)  ? ? ?History reviewed. No pertinent surgical history.  ? ?Allergies  ?Allergen Reactions  ? Iohexol Other (See Comments)  ?   Desc: EYES & LIPS SWELLING, SOB DURING A MYELOGRAM '88, DECADRON GIVEN/ PRE MEDS REQUIRED/A.C., Onset Date: [id number] ? Desc: EYES & LIPS SWELLING, SOB DURING A MYELOGRAM '88, DECADRON GIVEN/ PRE MEDS REQUIRED/A.C., Onset Date: [id number] ?  ? Shellfish Containing Products Hives and Swelling  ?  Hard shellfish  ? Meperidine Nausea And Vomiting  ?  Can take with Phenergan ?Can take with Phenergan ?  ? Penicillin Swelling  ? Opioids - Morphine Analogues Other (See Comments)  ?  hyperactivity ?  ? Rosiglitazone Other (See  Comments)  ?  Other reaction(s): Weight gain and edema ?  ? Latex Hives  ? Liraglutide Nausea, Nausea And Vomiting and Other (See Comments)  ?  headaches ?headaches ?Severe Headache ?  ? Pradaxa [Dabigatran Etexilate] Headache  ?  Severe headache  ? ? ?Current Outpatient Medications on File Prior to Visit  ?Medication Sig Dispense Refill  ? amiodarone (PACERONE) 100 MG tablet Take 100 mg by mouth once daily    ? apixaban (ELIQUIS) 5 mg tablet Take 5 mg by mouth every 12 (twelve) hours    ? atorvastatin (LIPITOR) 80 MG tablet Take 80 mg by mouth once daily    ? buPROPion (WELLBUTRIN XL) 300 MG XL tablet TAKE 1 TABLET EVERY MORNING 90  tablet 0  ? cholecalciferol, vitamin D3, (VITAMIN D3) 5,000 unit Tab Take 5,000 Units by mouth once daily 30 tablet 2  ? cyanocobalamin (VITAMIN B12) 1000 MCG tablet Take 1,000 mcg by mouth once daily    ? dextroamphetamine-amphetamine (ADDERALL) 20 mg tablet Take 20 mg by mouth 3 (three) times daily    ? esomeprazole (NEXIUM) 40 MG DR capsule Take 40 mg by mouth once daily    ? FUROsemide (LASIX) 20 MG tablet Take 10 mg by mouth once daily as needed for Edema    ? insuln adm.sup/lancets (INSULIN ADMIN SUPPL. W/LANCETS MISC) Use    ? metoprolol tartrate (LOPRESSOR) 25 MG tablet Take 25 mg by mouth 2 (two) times daily    ? naloxegoL (MOVANTIK) tablet Take by mouth    ? oxyCODONE (OXYCONTIN) 40 MG CR tablet Take 40 mg by mouth 3 (three) times a day       ? oxyCODONE (ROXICODONE) 5 MG immediate release tablet Take 1 tablet every 6 hours prn pain, up to 6 tablets per day (Patient taking differently: Take 1 tablet every 6 hours prn pain, up to 4 tablets per day  ) 30 tablet 0  ? pregabalin (LYRICA) 150 MG capsule Take 150 mg by mouth 2 (two) times daily    ? QUEtiapine (SEROQUEL) 25 MG tablet Take 1 tablet (25 mg total) by mouth once daily as needed (anxiety or sleep) for up to 90 days 90 tablet 1  ? sertraline (ZOLOFT) 100 MG tablet Take 2 tablets (200 mg total) by mouth once daily Please call [phone] to make appt with Dr.  Wynetta Emery for future refills 180 tablet 0  ? ?No current facility-administered medications on file prior to visit.  ? ? ?History reviewed. No pertinent family history.  ? ?Social History  ? ?Tobacco Use  ?Smoking Status Never  ?Smokeless Tobacco Never  ?  ? ?Social History  ? ?Socioeconomic History  ? Marital status: Married  ?Tobacco Use  ? Smoking status: Never  ? Smokeless tobacco: Never  ?Vaping Use  ? Vaping Use: Never used  ?Substance and Sexual Activity  ? Alcohol use: Never  ? Drug use: Never  ? ? ?Objective:  ? ? ?Vitals:  ? 09/02/21 1500  ?BP: 120/60  ?Pulse: 70  ?Temp: 36.3 ?C (97.3 ?F)   ?SpO2: 98%  ?Weight: 97.3 kg (214 lb 9.6 oz)  ?Height: 165.1 cm (_0 )  ?  ?Body mass index is 35.71 kg/m?. ? ? ?Approximately 8 x 6 cm smooth mobile subcutaneous mass in the left anterior lateral thigh, some nearby varicose veins in the soft tissue but otherwise no overlying skin changes. ? ?Assessment and Plan:  ?Diagnoses and all orders for this visit: ? ?Subcutaneous mass ? ?We discussed excision  under MAC.  Discussed technique of surgery and risks of bleeding, infection, pain, scarring, injury to underlying structures, seroma/hematoma, wound healing problems, recurrence of the lesion.  Questions welcomed and answered.  She wishes to proceed. ? ?Khloi Rawl Raquel James, MD  ?

## 2021-09-03 DIAGNOSIS — C4401 Basal cell carcinoma of skin of lip: Secondary | ICD-10-CM | POA: Diagnosis not present

## 2021-09-05 DIAGNOSIS — C4401 Basal cell carcinoma of skin of lip: Secondary | ICD-10-CM | POA: Diagnosis not present

## 2021-09-06 ENCOUNTER — Other Ambulatory Visit: Payer: Self-pay | Admitting: Interventional Cardiology

## 2021-09-06 ENCOUNTER — Telehealth: Payer: Self-pay | Admitting: *Deleted

## 2021-09-06 ENCOUNTER — Other Ambulatory Visit: Payer: Self-pay | Admitting: Physician Assistant

## 2021-09-06 DIAGNOSIS — K220 Achalasia of cardia: Secondary | ICD-10-CM

## 2021-09-06 DIAGNOSIS — R1310 Dysphagia, unspecified: Secondary | ICD-10-CM | POA: Diagnosis not present

## 2021-09-06 DIAGNOSIS — Z800 Family history of malignant neoplasm of digestive organs: Secondary | ICD-10-CM | POA: Diagnosis not present

## 2021-09-06 DIAGNOSIS — K5900 Constipation, unspecified: Secondary | ICD-10-CM | POA: Diagnosis not present

## 2021-09-06 DIAGNOSIS — E1142 Type 2 diabetes mellitus with diabetic polyneuropathy: Secondary | ICD-10-CM | POA: Diagnosis not present

## 2021-09-06 DIAGNOSIS — Z794 Long term (current) use of insulin: Secondary | ICD-10-CM | POA: Diagnosis not present

## 2021-09-06 DIAGNOSIS — I480 Paroxysmal atrial fibrillation: Secondary | ICD-10-CM

## 2021-09-06 DIAGNOSIS — N189 Chronic kidney disease, unspecified: Secondary | ICD-10-CM | POA: Diagnosis not present

## 2021-09-06 NOTE — Telephone Encounter (Signed)
? ?  Pre-operative Risk Assessment  ?  ?Patient Name: [name]  ?DOB: Jun 01, 1945 ?MRN: [id number]  ? ?  ? ?Request for Surgical Clearance   ? ?Procedure:   EXCISION OF LEFT THIGH MASS ?EXCISION OF LEFT THIGH ?Date of Surgery:  Clearance 10/21/21                              ?   ?Surgeon:  DR. Romana Juniper ?Surgeon's Group or Practice Name:  [facility] ?Phone number:  [phone] ?Fax number:  [phone] ATTN: Malachi Bonds, CMA ?  ?Type of Clearance Requested:   ?- Medical  ?- Pharmacy:  Hold Apixaban (Eliquis)   ?  ?Type of Anesthesia:  General  ?  ?Additional requests/questions:   ? ?Signed, ?Julaine Hua   ?09/06/2021, 5:18 PM  ? ?

## 2021-09-07 ENCOUNTER — Ambulatory Visit
Admission: RE | Admit: 2021-09-07 | Discharge: 2021-09-07 | Disposition: A | Discharge status: Home / Self Care | Payer: Medicare Other | Source: Ambulatory Visit | Attending: Physician Assistant | Admitting: Physician Assistant

## 2021-09-07 DIAGNOSIS — R1310 Dysphagia, unspecified: Secondary | ICD-10-CM

## 2021-09-07 DIAGNOSIS — K220 Achalasia of cardia: Secondary | ICD-10-CM

## 2021-09-07 DIAGNOSIS — K2289 Other specified disease of esophagus: Secondary | ICD-10-CM | POA: Diagnosis not present

## 2021-09-07 DIAGNOSIS — C4401 Basal cell carcinoma of skin of lip: Secondary | ICD-10-CM | POA: Diagnosis not present

## 2021-09-07 NOTE — Telephone Encounter (Signed)
Eliquis '5mg'$  refill request received. Patient is 76 years old, weight-90.7kg, Crea-1.50 on 08/09/2021 via [facility] from [place], and last seen by Dr. Tamala Julian on 07/04/2021. Dose is appropriate based on dosing criteria. Will send in refill to requested pharmacy.   ?

## 2021-09-07 NOTE — Telephone Encounter (Signed)
? ? ?  Name: [name]  ?DOB: [birth year]  ?MRN: [id number] ? ?Primary Cardiologist: Sinclair Grooms, MD ? ? ?Preoperative team, please contact this patient and set up a phone call appointment for further preoperative risk assessment. Please obtain consent and complete medication review. Thank you for your help. ? ?I confirm that guidance regarding antiplatelet and oral anticoagulation therapy has been completed and, if necessary, noted below. ? ?Patient with diagnosis of afib on Eliquis for anticoagulation.   ?  ?Procedure: excision of left thigh mass ?Date of procedure: 10/21/21 ?  ?CHA2DS2-VASc Score = 4  ?This indicates a 4.8% annual risk of stroke. ?The patient's score is based upon: ?CHF History: 0 ?HTN History: 1 ?Diabetes History: 1 ?Stroke History: 0 ?Vascular Disease History: 0 ?Age Score: 1 ?Gender Score: 1 ?  ?CrCl 36m/min using adjusted body weight ?Platelet count 318K ?  ?Per office protocol, patient can hold Eliquis for 2 days prior to procedure.   ? ?ELenna Sciara NP ?09/07/2021, 4:43 PM ?[place]?[id number] ?[place] [id number]? ? ?

## 2021-09-07 NOTE — Telephone Encounter (Signed)
Patient with diagnosis of afib on Eliquis for anticoagulation.   ? ?Procedure: excision of left thigh mass ?Date of procedure: 10/21/21 ? ?CHA2DS2-VASc Score = 4  ?This indicates a 4.8% annual risk of stroke. ?The patient's score is based upon: ?CHF History: 0 ?HTN History: 1 ?Diabetes History: 1 ?Stroke History: 0 ?Vascular Disease History: 0 ?Age Score: 1 ?Gender Score: 1 ?  ?CrCl 34m/min using adjusted body weight ?Platelet count 318K ? ?Per office protocol, patient can hold Eliquis for 2 days prior to procedure.   ?

## 2021-09-08 NOTE — Telephone Encounter (Signed)
Called pt to schedule TELE visit but she wasn't at home with her calendar. She will call back to schedule.

## 2021-09-09 DIAGNOSIS — C4401 Basal cell carcinoma of skin of lip: Secondary | ICD-10-CM | POA: Diagnosis not present

## 2021-09-12 NOTE — Telephone Encounter (Signed)
Left message for the pt to call back for tele appt  

## 2021-09-13 DIAGNOSIS — C4401 Basal cell carcinoma of skin of lip: Secondary | ICD-10-CM | POA: Diagnosis not present

## 2021-09-14 ENCOUNTER — Telehealth: Payer: Self-pay | Admitting: *Deleted

## 2021-09-14 DIAGNOSIS — C4401 Basal cell carcinoma of skin of lip: Secondary | ICD-10-CM | POA: Diagnosis not present

## 2021-09-14 NOTE — Telephone Encounter (Signed)
Pt agreeable to plan of care for tele pre op appt. Med rec and consent are done.    Patient Consent for Virtual Visit        [name] has provided verbal consent on 09/14/2021 for a virtual visit (video or telephone).   CONSENT FOR VIRTUAL VISIT FOR:  [name]  By participating in this virtual visit I agree to the following:  I hereby voluntarily request, consent and authorize [facility] and its employed or contracted physicians, physician assistants, nurse practitioners or other licensed health care professionals (the Practitioner), to provide me with telemedicine health care services (the "Services") as deemed necessary by the treating Practitioner. I acknowledge and consent to receive the Services by the Practitioner via telemedicine. I understand that the telemedicine visit will involve communicating with the Practitioner through live audiovisual communication technology and the disclosure of certain medical information by electronic transmission. I acknowledge that I have been given the opportunity to request an in-person assessment or other available alternative prior to the telemedicine visit and am voluntarily participating in the telemedicine visit.  I understand that I have the right to withhold or withdraw my consent to the use of telemedicine in the course of my care at any time, without affecting my right to future care or treatment, and that the Practitioner or I may terminate the telemedicine visit at any time. I understand that I have the right to inspect all information obtained and/or recorded in the course of the telemedicine visit and may receive copies of available information for a reasonable fee.  I understand that some of the potential risks of receiving the Services via telemedicine include:  Delay or interruption in medical evaluation due to technological equipment failure or disruption; Information transmitted may not be sufficient (e.g. poor resolution  of images) to allow for appropriate medical decision making by the Practitioner; and/or  In rare instances, security protocols could fail, causing a breach of personal health information.  Furthermore, I acknowledge that it is my responsibility to provide information about my medical history, conditions and care that is complete and accurate to the best of my ability. I acknowledge that Practitioner's advice, recommendations, and/or decision may be based on factors not within their control, such as incomplete or inaccurate data provided by me or distortions of diagnostic images or specimens that may result from electronic transmissions. I understand that the practice of medicine is not an exact science and that Practitioner makes no warranties or guarantees regarding treatment outcomes. I acknowledge that a copy of this consent can be made available to me via my patient portal ([facility]), or I can request a printed copy by calling the office of [facility].    I understand that my insurance will be billed for this visit.   I have read or had this consent read to me. I understand the contents of this consent, which adequately explains the benefits and risks of the Services being provided via telemedicine.  I have been provided ample opportunity to ask questions regarding this consent and the Services and have had my questions answered to my satisfaction. I give my informed consent for the services to be provided through the use of telemedicine in my medical care

## 2021-09-14 NOTE — Telephone Encounter (Signed)
Pt agreeable to plan of care for tele pre op appt. Med rec and consent are done. 

## 2021-09-16 ENCOUNTER — Ambulatory Visit (INDEPENDENT_AMBULATORY_CARE_PROVIDER_SITE_OTHER): Payer: Medicare Other | Admitting: General Practice

## 2021-09-16 DIAGNOSIS — Z01810 Encounter for preprocedural cardiovascular examination: Secondary | ICD-10-CM

## 2021-09-16 NOTE — Progress Notes (Signed)
Virtual Visit via Telephone Note   Because of Joydan K Scheller's co-morbid illnesses, she is at least at moderate risk for complications without adequate follow up.  This format is felt to be most appropriate for this patient at this time.  The patient did not have access to video technology/had technical difficulties with video requiring transitioning to audio format only (telephone).  All issues noted in this document were discussed and addressed.  No physical exam could be performed with this format.  Please refer to the patient's chart for her consent to telehealth for [facility].  Evaluation Performed:  Preoperative cardiovascular risk assessment _____________   Date:  09/16/2021   Patient ID:  MANASA SPEASE, DOB 1946-02-11, MRN [id number] Patient Location:  Home Provider location:   Office  Primary Care Provider:  Lajean Manes, MD Primary Cardiologist:  Sinclair Grooms, MD  Chief Complaint / Patient Profile   76 y.o. y/o female with a h/o PAF, DM, MVP, HLD, fibromyalgia who is pending excision of left thigh mass and presents today for telephonic preoperative cardiovascular risk assessment.  Past Medical History    Past Medical History:  Diagnosis Date   Cataract    Complication of anesthesia 1975   projectile vomitting   Diabetes mellitus    Dyslipidemia    Dysrhythmia    hx. A.Fib-Cardioversion-converted rhythm   Fibromyalgia    GERD (gastroesophageal reflux disease)    Hypertension    MVP (mitral valve prolapse)    OSA (obstructive sleep apnea) 10-18-12   no cpap used now.   PONV (postoperative nausea and vomiting)    Vitamin D deficiency 10-18-12   Past Surgical History:  Procedure Laterality Date   ABDOMINAL HYSTERECTOMY     '95   BALLOON DILATION N/A 12/03/2012   Procedure: BALLOON DILATION;  Surgeon: [name], MD;  Location: [facility];  Service: Endoscopy;  Laterality: N/A;   BREAST EXCISIONAL BIOPSY Left    CARDIOVERSION  07/28/2011    Procedure: CARDIOVERSION;  Surgeon: Sinclair Grooms, MD;  Location: [place];  Service: Cardiovascular;  Laterality: N/A;   CATARACT EXTRACTION Left 10-18-12   10'13   COLONOSCOPY WITH PROPOFOL N/A 03/29/2015   Procedure: COLONOSCOPY WITH PROPOFOL;  Surgeon: [name], MD;  Location: [facility];  Service: Endoscopy;  Laterality: N/A;   ESOPHAGEAL MANOMETRY N/A 05/19/2013   Procedure: ESOPHAGEAL MANOMETRY (EM);  Surgeon: [name], MD;  Location: [facility];  Service: Endoscopy;  Laterality: N/A;   ESOPHAGOGASTRODUODENOSCOPY (EGD) WITH PROPOFOL N/A 12/03/2012   Procedure: ESOPHAGOGASTRODUODENOSCOPY (EGD) WITH Dilation & w/PROPOFOL;  Surgeon: [name], MD;  Location: [facility];  Service: Endoscopy;  Laterality: N/A;   RETINAL DETACHMENT SURGERY Bilateral 11/2016   THYROIDECTOMY  1981   TUBAL LIGATION      Allergies  Allergies  Allergen Reactions   Iohexol Other (See Comments)     Desc: EYES & LIPS SWELLING, SOB DURING A MYELOGRAM '88, DECADRON GIVEN/ PRE MEDS REQUIRED/A.C., Onset Date: [id number]    Morphine And Related Other (See Comments)    hyperactivity   Penicillins Hives and Swelling    Has patient had a PCN reaction causing immediate rash, facial/tongue/throat swelling, SOB or lightheadedness with hypotension: Yes Has patient had a PCN reaction causing severe rash involving mucus membranes or skin necrosis: No Has patient had a PCN reaction that required hospitalization No Has patient had a PCN reaction occurring within the last 10 years: No If all of the above answers are "  NO", then may proceed with Cephalosporin use.    Shellfish Allergy Hives and Swelling    Hard shellfish   Demerol Nausea And Vomiting    Can take with Phenergan   Rosiglitazone     Other reaction(s): Weight gain and edema   Victoza [Liraglutide]     headaches   Dabigatran Etexilate Mesylate Other (See Comments)    Headaches    Latex Rash    Contact dermatitis    History of  Present Illness    Latavia JELENE ALBANO is a 76 y.o. female who presents via audio/video conferencing for a telehealth visit today.  Pt was last seen in cardiology clinic on 07/04/2021 by Dr. Tamala Julian.  At that time CORBIN HOTT was doing well .  The patient is now pending procedure as outlined above. Since her last visit, she remained stable from a cardiac standpoint.  Today she denies chest pain, shortness of breath, lower extremity edema, fatigue, palpitations, melena, hematuria, hemoptysis, diaphoresis, weakness, presyncope, syncope, orthopnea, and PND.   Home Medications    Prior to Admission medications   Medication Sig Start Date End Date Taking? Authorizing Provider  acetaminophen (TYLENOL) 500 MG tablet Take 500 mg by mouth every 6 (six) hours as needed for mild pain or fever.     [provider]  amiodarone (PACERONE) 200 MG tablet Take 0.5 tablets (100 mg total) by mouth daily. 12/23/20   Belva Crome, MD  amphetamine-dextroamphetamine (ADDERALL) 20 MG tablet Take 20 mg by mouth 3 (three) times daily.    [provider]  atorvastatin (LIPITOR) 80 MG tablet Take 80 mg by mouth at bedtime.    [provider]  azelastine (ASTELIN) 0.1 % nasal spray Place 1 spray into both nostrils daily. Use in each nostril as directed 08/10/21   Chesley Mires, MD  BD PEN NEEDLE NANO U/F 32G X 4 MM MISC  03/22/13   [provider]  buPROPion (WELLBUTRIN XL) 300 MG 24 hr tablet Take 300 mg by mouth every morning. 09/19/19   [provider]  Cholecalciferol (VITAMIN D) 125 MCG (5000 UT) CAPS Take 5,000 Units by mouth daily.     [provider]  ELIQUIS 5 MG TABS tablet TAKE 1 TABLET TWICE A DAY 09/07/21   Belva Crome, MD  esomeprazole (NEXIUM) 40 MG capsule TAKE 1 CAPSULE TWICE DAILY BEFORE MEALS 12/07/17   Nandigam, Venia Minks, MD  FARXIGA 10 MG TABS tablet Take 10 mg by mouth daily. 08/05/21   [provider]  fluticasone (FLONASE) 50 MCG/ACT nasal  spray Place 1 spray into both nostrils daily. 08/10/21   Chesley Mires, MD  FREESTYLE LITE test strip 4 (four) times daily. 09/26/19   [provider]  furosemide (LASIX) 20 MG tablet Take 0.5 tablets (10 mg total) by mouth daily as needed for fluid. 12/30/19   Belva Crome, MD  Insulin Glargine [facility] KWIKPEN) 100 UNIT/ML SOPN Inject 40 Units into the skin daily.    [provider]  insulin lispro (HUMALOG) 100 UNIT/ML injection Inject 15 Units into the skin 3 (three) times daily. Inject 15-25 units into the skin 3 times daily    [provider]  ketoconazole (NIZORAL) 2 % cream as needed.    [provider]  linaclotide Rolan Lipa) 145 MCG CAPS capsule 1 capsule at least 30 minutes before the first meal of the day on an empty stomach 09/06/21   [provider]  Melatonin 10 MG TBDP Take 1 capsule  by mouth as needed.    [provider]  metoprolol tartrate (LOPRESSOR) 25 MG tablet Take 1 tablet (25 mg total) by mouth 2 (two) times daily. 06/13/21   Belva Crome, MD  MOVANTIK 12.5 MG TABS tablet Take 12.5 mg by mouth daily. 07/19/17   [provider]  nitroGLYCERIN (NITROSTAT) 0.4 MG SL tablet as needed.    [provider]  oxyCODONE (OXY IR/ROXICODONE) 5 MG immediate release tablet Take 5 mg by mouth every 6 (six) hours as needed (for breakthrough pain).    [provider]  OXYCONTIN 30 MG 12 hr tablet Take 1 tablet by mouth 3 (three) times daily. Patient not taking: Reported on 08/10/2021 05/10/21   [provider]  pregabalin (LYRICA) 150 MG capsule Take 150 mg by mouth. 1 in the am and 2 in the pm    [provider]  Semaglutide,0.25 or 0.'5MG'$ /DOS, (OZEMPIC, 0.25 OR 0.5 MG/DOSE,) 2 MG/1.5ML SOPN Titrate 0.25 mg once weekly for 4 weeks then 0.5 mg once weekly for 2 weeks 09/06/21   [provider]  sertraline (ZOLOFT) 100 MG tablet Take 100 mg by mouth 2 (two) times daily. 06/11/21   [provider]  tiZANidine (ZANAFLEX) 2 MG tablet Take 2 mg by mouth 3 (three) times daily as needed. 09/26/19   [provider]  estrogens, conjugated, (PREMARIN) 0.9 MG tablet Take 0.9 mg by mouth daily.  07/18/11  [provider]  glimepiride (AMARYL) 4 MG tablet Take 4 mg by mouth 2 (two) times daily.   07/18/11  [provider]  metFORMIN (GLUCOPHAGE) 500 MG tablet Take 1,000 mg by mouth 2 (two) times daily with a meal.    07/18/11  [provider]    Physical Exam    Vital Signs:  [place] does not have vital signs available for review today.  Given telephonic nature of communication, physical exam is limited. AAOx3. NAD. Normal affect.  Speech and respirations are unlabored.  Accessory Clinical Findings    None  Assessment & Plan    1.  Preoperative Cardiovascular Risk Assessment: Excision of left thigh mass, Dr. Kae Heller     Primary Cardiologist: Sinclair Grooms, MD  Chart reviewed as part of pre-operative protocol coverage. Given past medical history and time since last visit, based on ACC/AHA guidelines, Prentiss K Darwish would be at acceptable risk for the planned procedure without further cardiovascular testing.   Patient was advised that if she develops new symptoms prior to surgery to contact our office to arrange a follow-up appointment.  She verbalized understanding.  Her RCRI is a class III risk, 6.6% risk of major cardiac event.  She is able to complete greater than 4 METS of physical activity.  Patient with diagnosis of afib on Eliquis for anticoagulation.     Procedure: excision of left thigh mass Date of procedure: 10/21/21   CHA2DS2-VASc Score = 4  This indicates a 4.8% annual risk of stroke. The patient's score is based upon: CHF History: 0 HTN History: 1 Diabetes History: 1 Stroke History: 0 Vascular Disease History: 0 Age Score: 1 Gender Score: 1   CrCl 44m/min using adjusted body weight Platelet count  318K   Per office protocol, patient can hold Eliquis for 2 days prior to procedure.      A copy of this note will be routed to requesting surgeon.  Time:   Today, I have spent 5 minutes with the patient with telehealth technology discussing medical  history, symptoms, and management plan.  Prior to her phone evaluation I spent greater than 10 minutes reviewing her past medical history and medications.   Deberah Pelton, NP  09/16/2021, 7:57 AM

## 2021-09-16 NOTE — Progress Notes (Signed)
CLEARANCE NOTES HAVE BEEN FAXED TO [phone]

## 2021-09-17 ENCOUNTER — Encounter (INDEPENDENT_AMBULATORY_CARE_PROVIDER_SITE_OTHER): Payer: Self-pay

## 2021-09-20 DIAGNOSIS — C4401 Basal cell carcinoma of skin of lip: Secondary | ICD-10-CM | POA: Diagnosis not present

## 2021-09-22 DIAGNOSIS — L659 Nonscarring hair loss, unspecified: Secondary | ICD-10-CM | POA: Diagnosis not present

## 2021-09-22 DIAGNOSIS — L304 Erythema intertrigo: Secondary | ICD-10-CM | POA: Diagnosis not present

## 2021-09-22 DIAGNOSIS — L218 Other seborrheic dermatitis: Secondary | ICD-10-CM | POA: Diagnosis not present

## 2021-09-22 DIAGNOSIS — C4401 Basal cell carcinoma of skin of lip: Secondary | ICD-10-CM | POA: Diagnosis not present

## 2021-09-23 DIAGNOSIS — C4401 Basal cell carcinoma of skin of lip: Secondary | ICD-10-CM | POA: Diagnosis not present

## 2021-09-26 DIAGNOSIS — C4401 Basal cell carcinoma of skin of lip: Secondary | ICD-10-CM | POA: Diagnosis not present

## 2021-09-27 DIAGNOSIS — C4401 Basal cell carcinoma of skin of lip: Secondary | ICD-10-CM | POA: Diagnosis not present

## 2021-10-01 DIAGNOSIS — C4401 Basal cell carcinoma of skin of lip: Secondary | ICD-10-CM | POA: Diagnosis not present

## 2021-10-03 ENCOUNTER — Ambulatory Visit (HOSPITAL_BASED_OUTPATIENT_CLINIC_OR_DEPARTMENT_OTHER): Admit: 2021-10-03 | Payer: Medicare Other | Admitting: Podiatry

## 2021-10-03 ENCOUNTER — Encounter (HOSPITAL_BASED_OUTPATIENT_CLINIC_OR_DEPARTMENT_OTHER): Payer: Self-pay

## 2021-10-03 SURGERY — BUNIONECTOMY, LAPIDUS
Anesthesia: Choice | Site: Toe | Laterality: Left

## 2021-10-05 ENCOUNTER — Ambulatory Visit: Payer: Medicare Other

## 2021-10-05 DIAGNOSIS — G4733 Obstructive sleep apnea (adult) (pediatric): Secondary | ICD-10-CM

## 2021-10-06 ENCOUNTER — Telehealth: Payer: Self-pay | Admitting: Pulmonary Disease

## 2021-10-06 DIAGNOSIS — G4733 Obstructive sleep apnea (adult) (pediatric): Secondary | ICD-10-CM | POA: Diagnosis not present

## 2021-10-06 NOTE — Telephone Encounter (Signed)
ATC patient.  LMTCB. 

## 2021-10-06 NOTE — Telephone Encounter (Signed)
HST 10/05/21 >> AHI 10.8, SpO2 low 85%   Please inform her that her sleep study shows mild obstructive sleep apnea.  Please arrange for ROV with me or NP to discuss treatment options.

## 2021-10-10 NOTE — Telephone Encounter (Signed)
Spoke with patient regarding HST results. They verbalized understanding. No further questions.  Nothing further needed at this time. Patient scheduled for [place] 7/5. Back to back appt with husband so they can go back together.

## 2021-10-12 ENCOUNTER — Encounter: Payer: Medicare Other | Admitting: Podiatry

## 2021-10-14 ENCOUNTER — Encounter (HOSPITAL_BASED_OUTPATIENT_CLINIC_OR_DEPARTMENT_OTHER): Payer: Self-pay | Admitting: Surgery

## 2021-10-17 ENCOUNTER — Encounter (HOSPITAL_BASED_OUTPATIENT_CLINIC_OR_DEPARTMENT_OTHER): Payer: Self-pay | Admitting: Surgery

## 2021-10-19 ENCOUNTER — Encounter (HOSPITAL_BASED_OUTPATIENT_CLINIC_OR_DEPARTMENT_OTHER): Payer: Self-pay | Admitting: Surgery

## 2021-10-19 NOTE — Progress Notes (Signed)
Spoke w/ via phone for pre-op interview--- pt Lab needs dos----   [employer] results------ current ekg in epic/ chart COVID test -----patient states asymptomatic no test needed Arrive at ------- 0815 on 10-21-2021 NPO after MN NO Solid Food.  Clear liquids from MN until---  0715 Med rec completed Medications to take morning of surgery ----- oxycontin, amiodarone, metoprolol, wellbutrin, zoloft, lyrica, nexium Diabetic medication ----- do not take any diabetic medication morning of surgery  Patient instructed no nail polish to be worn day of surgery Patient instructed to bring photo id and insurance card day of surgery Patient aware to have Driver (ride ) / caregiver  for 24 hours after surgery --husband, Administrator, [employer] Patient Special Instructions ----- n/a Pre-Op special Istructions ----- pt has cardiac office visit clearance by Coletta Memos NP on 09-16-2021 in epic/ chart. Pt stated was given instructions from cardiology to stop eliquis 2 days prior to surgery , stated last dose 10-18-2021. Patient verbalized understanding of instructions that were given at this phone interview. Patient denies shortness of breath, chest pain, fever, cough at this phone interview.

## 2021-10-20 ENCOUNTER — Telehealth: Payer: Self-pay | Admitting: *Deleted

## 2021-10-20 DIAGNOSIS — T402X5A Adverse effect of other opioids, initial encounter: Secondary | ICD-10-CM | POA: Diagnosis not present

## 2021-10-20 DIAGNOSIS — K5903 Drug induced constipation: Secondary | ICD-10-CM | POA: Diagnosis not present

## 2021-10-20 DIAGNOSIS — R1310 Dysphagia, unspecified: Secondary | ICD-10-CM | POA: Diagnosis not present

## 2021-10-20 DIAGNOSIS — K222 Esophageal obstruction: Secondary | ICD-10-CM | POA: Diagnosis not present

## 2021-10-20 DIAGNOSIS — I4891 Unspecified atrial fibrillation: Secondary | ICD-10-CM | POA: Diagnosis not present

## 2021-10-20 DIAGNOSIS — Z800 Family history of malignant neoplasm of digestive organs: Secondary | ICD-10-CM | POA: Diagnosis not present

## 2021-10-20 DIAGNOSIS — K5900 Constipation, unspecified: Secondary | ICD-10-CM | POA: Diagnosis not present

## 2021-10-20 NOTE — Telephone Encounter (Signed)
   Name: [name]  DOB: Jul 24, 1945  MRN: [id number]   Primary Cardiologist: Sinclair Grooms, MD  Chart reviewed as part of pre-operative protocol coverage.  [name] was last seen on 09/16/2021 by Coletta Memos, NP.  She was doing well at that time.  Pharmacological clearance was requested for her upcoming procedure.  Per our pharmacy staff patient can hold Eliquis x2 days prior to the procedure.  Please resume when deemed medically safe.  I will route this recommendation to the requesting party via Epic fax function and remove from pre-op pool. Please call with questions.  Elgie Collard, PA-C 10/20/2021, 4:56 PM

## 2021-10-20 NOTE — Telephone Encounter (Signed)
   Pre-operative Risk Assessment    Patient Name: [name]  DOB: 01-20-1946 MRN: [id number]      Request for Surgical Clearance    Procedure:   COLONOSCOPY / ENDOSCOPY  Date of Surgery:  Clearance 12/30/21                                 Surgeon:  DR. [facility] Surgeon's Group or Practice Name:  [facility] Phone number:  [phone] Fax number:  [phone]   Type of Clearance Requested:   - Pharmacy:  Hold Apixaban (Eliquis) X'S 2 DAYS   Type of Anesthesia:   PROPOFOL   Additional requests/questions:    Signed, Jeanann Lewandowsky   10/20/2021, 2:26 PM

## 2021-10-20 NOTE — Telephone Encounter (Signed)
Patient with diagnosis of afib on Eliquis for anticoagulation.    Procedure: colonoscopy/endoscopy Date of procedure: 12/30/21  CHA2DS2-VASc Score = 4  This indicates a 4.8% annual risk of stroke. The patient's score is based upon: CHF History: 0 HTN History: 1 Diabetes History: 1 Stroke History: 0 Vascular Disease History: 0 Age Score: 1 Gender Score: 1   CrCl 1m/min using adjusted body weight due to obesity Platelet count 318K  Per office protocol, patient can hold Eliquis for 2 days prior to procedure as requested.    **This guidance is not considered finalized until pre-operative APP has relayed final recommendations.**

## 2021-10-21 ENCOUNTER — Ambulatory Visit (HOSPITAL_BASED_OUTPATIENT_CLINIC_OR_DEPARTMENT_OTHER)
Admission: RE | Admit: 2021-10-21 | Discharge: 2021-10-21 | Disposition: A | Discharge status: Home / Self Care | Payer: Medicare Other | Attending: Surgery | Admitting: Surgery

## 2021-10-21 ENCOUNTER — Other Ambulatory Visit: Payer: Self-pay

## 2021-10-21 ENCOUNTER — Ambulatory Visit (HOSPITAL_BASED_OUTPATIENT_CLINIC_OR_DEPARTMENT_OTHER): Payer: Medicare Other | Admitting: Anesthesiology

## 2021-10-21 ENCOUNTER — Encounter (HOSPITAL_BASED_OUTPATIENT_CLINIC_OR_DEPARTMENT_OTHER)
Admission: RE | Disposition: A | Discharge status: Home / Self Care | Payer: Self-pay | Source: Home / Self Care | Attending: Surgery

## 2021-10-21 ENCOUNTER — Encounter (HOSPITAL_BASED_OUTPATIENT_CLINIC_OR_DEPARTMENT_OTHER): Payer: Self-pay | Admitting: Surgery

## 2021-10-21 DIAGNOSIS — Z6835 Body mass index (BMI) 35.0-35.9, adult: Secondary | ICD-10-CM | POA: Insufficient documentation

## 2021-10-21 DIAGNOSIS — I129 Hypertensive chronic kidney disease with stage 1 through stage 4 chronic kidney disease, or unspecified chronic kidney disease: Secondary | ICD-10-CM | POA: Diagnosis not present

## 2021-10-21 DIAGNOSIS — D1724 Benign lipomatous neoplasm of skin and subcutaneous tissue of left leg: Secondary | ICD-10-CM | POA: Diagnosis not present

## 2021-10-21 DIAGNOSIS — Z7901 Long term (current) use of anticoagulants: Secondary | ICD-10-CM | POA: Insufficient documentation

## 2021-10-21 DIAGNOSIS — E1122 Type 2 diabetes mellitus with diabetic chronic kidney disease: Secondary | ICD-10-CM | POA: Insufficient documentation

## 2021-10-21 DIAGNOSIS — Z794 Long term (current) use of insulin: Secondary | ICD-10-CM | POA: Insufficient documentation

## 2021-10-21 DIAGNOSIS — K219 Gastro-esophageal reflux disease without esophagitis: Secondary | ICD-10-CM | POA: Insufficient documentation

## 2021-10-21 DIAGNOSIS — G4730 Sleep apnea, unspecified: Secondary | ICD-10-CM | POA: Insufficient documentation

## 2021-10-21 DIAGNOSIS — Z87891 Personal history of nicotine dependence: Secondary | ICD-10-CM | POA: Diagnosis not present

## 2021-10-21 DIAGNOSIS — N1831 Chronic kidney disease, stage 3a: Secondary | ICD-10-CM | POA: Diagnosis not present

## 2021-10-21 DIAGNOSIS — Z7984 Long term (current) use of oral hypoglycemic drugs: Secondary | ICD-10-CM | POA: Diagnosis not present

## 2021-10-21 DIAGNOSIS — I480 Paroxysmal atrial fibrillation: Secondary | ICD-10-CM | POA: Diagnosis not present

## 2021-10-21 DIAGNOSIS — E669 Obesity, unspecified: Secondary | ICD-10-CM | POA: Diagnosis not present

## 2021-10-21 DIAGNOSIS — R2242 Localized swelling, mass and lump, left lower limb: Secondary | ICD-10-CM | POA: Diagnosis not present

## 2021-10-21 DIAGNOSIS — N183 Chronic kidney disease, stage 3 unspecified: Secondary | ICD-10-CM

## 2021-10-21 DIAGNOSIS — N189 Chronic kidney disease, unspecified: Secondary | ICD-10-CM | POA: Diagnosis not present

## 2021-10-21 DIAGNOSIS — Z01818 Encounter for other preprocedural examination: Secondary | ICD-10-CM

## 2021-10-21 HISTORY — DX: Other constipation: K59.09

## 2021-10-21 HISTORY — DX: Attention-deficit hyperactivity disorder, unspecified type: F90.9

## 2021-10-21 HISTORY — DX: Type 2 diabetes mellitus with diabetic neuropathy, unspecified: E11.40

## 2021-10-21 HISTORY — DX: Paroxysmal atrial fibrillation: I48.0

## 2021-10-21 HISTORY — DX: Myalgic encephalomyelitis/chronic fatigue syndrome: G93.32

## 2021-10-21 HISTORY — DX: Type 2 diabetes mellitus without complications: E11.9

## 2021-10-21 HISTORY — DX: Secondary hyperparathyroidism of renal origin: N25.81

## 2021-10-21 HISTORY — DX: Personal history of colonic polyps: Z86.010

## 2021-10-21 HISTORY — DX: Complete loss of teeth, unspecified cause, unspecified class: Z97.2

## 2021-10-21 HISTORY — DX: Personal history of colon polyps, unspecified: Z86.0100

## 2021-10-21 HISTORY — DX: Age-related osteoporosis without current pathological fracture: M81.0

## 2021-10-21 HISTORY — DX: Unspecified atrial fibrillation: I48.91

## 2021-10-21 HISTORY — DX: Type 2 diabetes mellitus without complications: Z79.4

## 2021-10-21 HISTORY — DX: Rosacea, unspecified: L71.9

## 2021-10-21 HISTORY — DX: Chronic kidney disease, stage 3 unspecified: N18.30

## 2021-10-21 HISTORY — PX: EXCISION MASS LOWER EXTREMETIES: SHX6705

## 2021-10-21 HISTORY — DX: Other cervical disc degeneration, unspecified cervical region: M50.30

## 2021-10-21 HISTORY — DX: Unsteadiness on feet: R26.81

## 2021-10-21 HISTORY — DX: Esophageal obstruction: K22.2

## 2021-10-21 HISTORY — DX: Long term (current) use of anticoagulants: Z79.01

## 2021-10-21 HISTORY — DX: Complete loss of teeth, unspecified cause, unspecified class: K08.109

## 2021-10-21 HISTORY — DX: Presence of dental prosthetic device (complete) (partial): Z97.2

## 2021-10-21 LAB — POCT I-STAT, CHEM 8
BUN: 28 mg/dL — ABNORMAL HIGH (ref 8–23)
Calcium, Ion: 1.2 mmol/L (ref 1.15–1.40)
Chloride: 103 mmol/L (ref 98–111)
Creatinine, Ser: 1.4 mg/dL — ABNORMAL HIGH (ref 0.44–1.00)
Glucose, Bld: 174 mg/dL — ABNORMAL HIGH (ref 70–99)
HCT: 44 % (ref 36.0–46.0)
Hemoglobin: 15 g/dL (ref 12.0–15.0)
Potassium: 4.8 mmol/L (ref 3.5–5.1)
Sodium: 141 mmol/L (ref 135–145)
TCO2: 26 mmol/L (ref 22–32)

## 2021-10-21 LAB — GLUCOSE, CAPILLARY: Glucose-Capillary: 215 mg/dL — ABNORMAL HIGH (ref 70–99)

## 2021-10-21 SURGERY — EXCISION MASS LOWER EXTREMITIES
Anesthesia: General | Site: Thigh | Laterality: Left

## 2021-10-21 MED ORDER — PROPOFOL 500 MG/50ML IV EMUL
INTRAVENOUS | Status: DC | PRN
  Administered 2021-10-21: 200 ug/kg/min via INTRAVENOUS

## 2021-10-21 MED ORDER — AMISULPRIDE (ANTIEMETIC) 5 MG/2ML IV SOLN: 10.0000 mg | Freq: Once | INTRAVENOUS | Status: DC | PRN

## 2021-10-21 MED ORDER — OXYCODONE HCL 5 MG/5ML PO SOLN: 5.0000 mg | Freq: Once | ORAL | Status: DC | PRN

## 2021-10-21 MED ORDER — ACETAMINOPHEN 500 MG PO TABS
ORAL_TABLET | ORAL | Status: AC
  Filled 2021-10-21: qty 2

## 2021-10-21 MED ORDER — LIDOCAINE 2% (20 MG/ML) 5 ML SYRINGE
INTRAMUSCULAR | Status: DC | PRN
  Administered 2021-10-21: 60 mg via INTRAVENOUS

## 2021-10-21 MED ORDER — BUPIVACAINE-EPINEPHRINE 0.25% -1:200000 IJ SOLN
INTRAMUSCULAR | Status: DC | PRN
  Administered 2021-10-21: 10 mL

## 2021-10-21 MED ORDER — ACETAMINOPHEN 500 MG PO TABS
1000.0000 mg | ORAL_TABLET | Freq: Once | ORAL | Status: AC
  Administered 2021-10-21: 1000 mg via ORAL

## 2021-10-21 MED ORDER — PROPOFOL 10 MG/ML IV BOLUS
INTRAVENOUS | Status: DC | PRN
  Administered 2021-10-21: 130 mg via INTRAVENOUS

## 2021-10-21 MED ORDER — LIDOCAINE HCL (PF) 2 % IJ SOLN
INTRAMUSCULAR | Status: AC
  Filled 2021-10-21: qty 5

## 2021-10-21 MED ORDER — DEXAMETHASONE SODIUM PHOSPHATE 10 MG/ML IJ SOLN
INTRAMUSCULAR | Status: DC | PRN
  Administered 2021-10-21: 5 mg via INTRAVENOUS

## 2021-10-21 MED ORDER — VANCOMYCIN HCL IN DEXTROSE 1-5 GM/200ML-% IV SOLN
INTRAVENOUS | Status: AC
  Filled 2021-10-21: qty 200

## 2021-10-21 MED ORDER — PROPOFOL 10 MG/ML IV BOLUS
INTRAVENOUS | Status: AC
  Filled 2021-10-21: qty 20

## 2021-10-21 MED ORDER — MIDAZOLAM HCL 5 MG/5ML IJ SOLN
INTRAMUSCULAR | Status: DC | PRN
  Administered 2021-10-21: 1 mg via INTRAVENOUS

## 2021-10-21 MED ORDER — MIDAZOLAM HCL 2 MG/2ML IJ SOLN
INTRAMUSCULAR | Status: AC
  Filled 2021-10-21: qty 2

## 2021-10-21 MED ORDER — PHENYLEPHRINE 80 MCG/ML (10ML) SYRINGE FOR IV PUSH (FOR BLOOD PRESSURE SUPPORT)
PREFILLED_SYRINGE | INTRAVENOUS | Status: AC
  Filled 2021-10-21: qty 10

## 2021-10-21 MED ORDER — 0.9 % SODIUM CHLORIDE (POUR BTL) OPTIME
TOPICAL | Status: DC | PRN
  Administered 2021-10-21: 500 mL

## 2021-10-21 MED ORDER — PROPOFOL 1000 MG/100ML IV EMUL
INTRAVENOUS | Status: AC
  Filled 2021-10-21: qty 100

## 2021-10-21 MED ORDER — PHENYLEPHRINE HCL-NACL 20-0.9 MG/250ML-% IV SOLN
INTRAVENOUS | Status: DC | PRN
  Administered 2021-10-21: 20 ug/min via INTRAVENOUS

## 2021-10-21 MED ORDER — FENTANYL CITRATE (PF) 100 MCG/2ML IJ SOLN
INTRAMUSCULAR | Status: AC
  Filled 2021-10-21: qty 2

## 2021-10-21 MED ORDER — PHENYLEPHRINE 80 MCG/ML (10ML) SYRINGE FOR IV PUSH (FOR BLOOD PRESSURE SUPPORT)
PREFILLED_SYRINGE | INTRAVENOUS | Status: DC | PRN
  Administered 2021-10-21 (×3): 160 ug via INTRAVENOUS

## 2021-10-21 MED ORDER — CHLORHEXIDINE GLUCONATE 4 % EX LIQD: 60.0000 mL | Freq: Once | CUTANEOUS | Status: DC

## 2021-10-21 MED ORDER — PHENYLEPHRINE HCL (PRESSORS) 10 MG/ML IV SOLN
INTRAVENOUS | Status: AC
  Filled 2021-10-21: qty 1

## 2021-10-21 MED ORDER — FENTANYL CITRATE (PF) 100 MCG/2ML IJ SOLN
INTRAMUSCULAR | Status: DC | PRN
Start: 2021-10-21 — End: 2021-10-21
  Administered 2021-10-21 (×2): 50 ug via INTRAVENOUS

## 2021-10-21 MED ORDER — VANCOMYCIN HCL IN DEXTROSE 1-5 GM/200ML-% IV SOLN
1000.0000 mg | INTRAVENOUS | Status: AC
  Administered 2021-10-21: 1000 mg via INTRAVENOUS

## 2021-10-21 MED ORDER — ONDANSETRON HCL 4 MG/2ML IJ SOLN
INTRAMUSCULAR | Status: DC | PRN
  Administered 2021-10-21: 4 mg via INTRAVENOUS

## 2021-10-21 MED ORDER — BUPIVACAINE LIPOSOME 1.3 % IJ SUSP: 20.0000 mL | Freq: Once | INTRAMUSCULAR | Status: DC

## 2021-10-21 MED ORDER — SODIUM CHLORIDE 0.9 % IV SOLN
INTRAVENOUS | Status: DC
Start: 2021-10-21 — End: 2021-10-21

## 2021-10-21 MED ORDER — OXYCODONE HCL 5 MG PO TABS: 5.0000 mg | ORAL_TABLET | Freq: Once | ORAL | Status: DC | PRN

## 2021-10-21 MED ORDER — FENTANYL CITRATE (PF) 100 MCG/2ML IJ SOLN: 25.0000 ug | INTRAMUSCULAR | Status: DC | PRN

## 2021-10-21 MED ORDER — ACETAMINOPHEN 500 MG PO TABS: 1000.0000 mg | ORAL_TABLET | ORAL | Status: DC

## 2021-10-21 SURGICAL SUPPLY — 38 items
ADH SKN CLS APL DERMABOND .7 (GAUZE/BANDAGES/DRESSINGS)
APL PRP STRL LF DISP 70% ISPRP (MISCELLANEOUS) ×1
APL SKNCLS STERI-STRIP NONHPOA (GAUZE/BANDAGES/DRESSINGS) ×1
BENZOIN TINCTURE PRP APPL 2/3 (GAUZE/BANDAGES/DRESSINGS) ×1 IMPLANT
BLADE SURG 15 STRL LF DISP TIS (BLADE) ×2 IMPLANT
BLADE SURG 15 STRL SS (BLADE) ×2
CHLORAPREP W/TINT 26 (MISCELLANEOUS) ×1 IMPLANT
COVER BACK TABLE 60X90IN (DRAPES) ×3 IMPLANT
COVER MAYO STAND STRL (DRAPES) ×3 IMPLANT
DERMABOND ADVANCED (GAUZE/BANDAGES/DRESSINGS)
DERMABOND ADVANCED .7 DNX12 (GAUZE/BANDAGES/DRESSINGS) IMPLANT
DRAPE LAPAROTOMY 100X72 PEDS (DRAPES) ×1 IMPLANT
DRAPE UTILITY XL STRL (DRAPES) ×3 IMPLANT
ELECT REM PT RETURN 9FT ADLT (ELECTROSURGICAL) ×2
ELECTRODE REM PT RTRN 9FT ADLT (ELECTROSURGICAL) ×2 IMPLANT
GAUZE 4X4 16PLY ~~LOC~~+RFID DBL (SPONGE) ×3 IMPLANT
GAUZE SPONGE 4X4 12PLY STRL (GAUZE/BANDAGES/DRESSINGS) ×3 IMPLANT
GLOVE BIOGEL PI IND STRL 6.5 (GLOVE) IMPLANT
GLOVE BIOGEL PI IND STRL 7.0 (GLOVE) IMPLANT
GLOVE BIOGEL PI INDICATOR 6.5 (GLOVE) ×2
GLOVE BIOGEL PI INDICATOR 7.0 (GLOVE) ×1
GLOVE SURG SS PI 6.0 STRL IVOR (GLOVE) ×1 IMPLANT
GOWN STRL REUS W/TWL LRG LVL3 (GOWN DISPOSABLE) ×4 IMPLANT
KIT TURNOVER CYSTO (KITS) ×3 IMPLANT
MANIFOLD NEPTUNE II (INSTRUMENTS) ×1 IMPLANT
NEEDLE HYPO 22GX1.5 SAFETY (NEEDLE) ×1 IMPLANT
NS IRRIG 500ML POUR BTL (IV SOLUTION) ×1 IMPLANT
PACK BASIN DAY SURGERY FS (CUSTOM PROCEDURE TRAY) ×3 IMPLANT
PENCIL SMOKE EVACUATOR (MISCELLANEOUS) ×3 IMPLANT
STRIP CLOSURE SKIN 1/2X4 (GAUZE/BANDAGES/DRESSINGS) ×1 IMPLANT
SUT MNCRL AB 4-0 PS2 18 (SUTURE) ×3 IMPLANT
SUT VIC AB 3-0 SH 27 (SUTURE) ×2
SUT VIC AB 3-0 SH 27X BRD (SUTURE) ×2 IMPLANT
SYR BULB IRRIG 60ML STRL (SYRINGE) ×3 IMPLANT
SYR CONTROL 10ML LL (SYRINGE) ×3 IMPLANT
TOWEL OR 17X26 10 PK STRL BLUE (TOWEL DISPOSABLE) ×3 IMPLANT
TUBE CONNECTING 12X1/4 (SUCTIONS) ×3 IMPLANT
YANKAUER SUCT BULB TIP NO VENT (SUCTIONS) ×3 IMPLANT

## 2021-10-21 NOTE — Anesthesia Procedure Notes (Signed)
Procedure Name: LMA Insertion Date/Time: 10/21/2021 10:51 AM  Performed by: Rogers Blocker, CRNAPre-anesthesia Checklist: Patient identified, Emergency Drugs available, Suction available and Patient being monitored Patient Re-evaluated:Patient Re-evaluated prior to induction Oxygen Delivery Method: Circle System Utilized Preoxygenation: Pre-oxygenation with 100% oxygen Induction Type: IV induction Ventilation: Mask ventilation without difficulty LMA: LMA inserted LMA Size: 4.0 Number of attempts: 1 Placement Confirmation: positive ETCO2 Tube secured with: Tape Dental Injury: Teeth and Oropharynx as per pre-operative assessment

## 2021-10-21 NOTE — H&P (Signed)
?[name] ?[id number]  ? ?Referring Provider:  Stoneking, Hal Thomas, * ? ? ?Subjective  ? ?Chief Complaint: New Consultation (Enlarging Lipoma, Left Thigh) ?  ? ? ?History of Present Illness: ?   ?A pleasant 76-year-old woman presents with an enlarging mass of the left thigh.  She had previously seen Dr. Newman about this at which time he documented approximately 4 x 6 cm.  It has slowly grown over time.  She occasionally bumps it with things and it is tender and occasionally bruises.  She is interested in having this removed. ? ?Review of Systems: ?A complete review of systems was obtained from the patient.  I have reviewed this information and discussed as appropriate with the patient.  See HPI as well for other ROS. ? ? ?Medical History: ?Past Medical History:  ?Diagnosis Date  ? Arthritis   ? OA and fibromalgia  ? Atrial fibrillation (CMS-HCC)   ? Bleeding disorder (CMS-HCC)   ? on blood thinner off for 2 days  ? Diabetes mellitus type 2, uncomplicated (CMS-HCC)   ? GERD (gastroesophageal reflux disease)   ? Hypertension   ? Kidney disease   ? stage 3 but stable  ? Pneumonia, unspecified organism   ? PONV (postoperative nausea and vomiting)   ? Sleep apnea   ? does not use cpap machine  overall AHI 14/hr  ? Thyroid disease   ? benign tumor  ? ? ?Patient Active Problem List  ?Diagnosis  ? Hallux valgus, bilateral  ? Abnormal gait  ? Achalasia  ? Acute kidney injury superimposed on chronic kidney disease (CMS-HCC)  ? Allergic rhinitis  ? Anticoagulation goal of INR 2 to 3  ? Anxiety  ? Bilateral carpal tunnel syndrome  ? Body mass index (BMI) 40.0-44.9, adult (CMS-HCC)  ? Chronic constipation  ? Chronic fatigue syndrome  ? Chronic kidney disease  ? Chronic kidney disease, stage 3a (CMS-HCC)  ? Closed fracture of fourth metatarsal bone of right foot  ? Closed fracture of second metatarsal bone  ? Combined hyperlipidemia  ? Diabetic nephropathy (CMS-HCC)  ? Essential hypertension  ? Fibromyalgia  ?  Impingement syndrome of right shoulder region  ? Laceration of finger of left hand  ? Long term (current) use of insulin (CMS-HCC)  ? Memory loss  ? Mitral valve disorder  ? Moderate recurrent major depression (CMS-HCC)  ? Morbid obesity (CMS-HCC)  ? Muscle jerks during sleep  ? Obstructive sleep apnea  ? On amiodarone therapy  ? Osteoporosis  ? Pain in left knee  ? Pain of left hand  ? Paroxysmal atrial fibrillation (CMS-HCC)  ? Polyneuropathy due to type 2 diabetes mellitus (CMS-HCC)  ? Pure hypercholesterolemia  ? Rosacea  ? Thrombophilia (CMS-HCC)  ? Type 2 diabetes mellitus without complication, with long-term current use of insulin (CMS-HCC)  ? ? ?History reviewed. No pertinent surgical history.  ? ?Allergies  ?Allergen Reactions  ? Iohexol Other (See Comments)  ?   Desc: EYES & LIPS SWELLING, SOB DURING A MYELOGRAM '88, DECADRON GIVEN/ PRE MEDS REQUIRED/A.C., Onset Date: [id number] ? Desc: EYES & LIPS SWELLING, SOB DURING A MYELOGRAM '88, DECADRON GIVEN/ PRE MEDS REQUIRED/A.C., Onset Date: 06291988 ?  ? Shellfish Containing Products Hives and Swelling  ?  Hard shellfish  ? Meperidine Nausea And Vomiting  ?  Can take with Phenergan ?Can take with Phenergan ?  ? Penicillin Swelling  ? Opioids - Morphine Analogues Other (See Comments)  ?  hyperactivity ?  ? Rosiglitazone Other (See   Hard shellfish   Meperidine Nausea And Vomiting      Can take with Phenergan Can take with Phenergan     Penicillin Swelling   Opioids - Morphine Analogues Other (See Comments)      hyperactivity     Rosiglitazone Other (See Comments)      Other reaction(s): Weight gain and edema     Latex Hives   Liraglutide Nausea, Nausea And Vomiting and Other (See Comments)      headaches headaches Severe Headache     Pradaxa [Dabigatran Etexilate] Headache      Severe headache            Current Outpatient Medications on File Prior to Visit  Medication Sig Dispense Refill   amiodarone (PACERONE) 100 MG tablet Take 100 mg by mouth once daily       apixaban (ELIQUIS) 5 mg tablet Take 5 mg by mouth every 12 (twelve) hours       atorvastatin (LIPITOR) 80 MG tablet Take 80 mg by mouth once daily        buPROPion (WELLBUTRIN XL) 300 MG XL tablet TAKE 1 TABLET EVERY MORNING 90 tablet 0   cholecalciferol, vitamin D3, (VITAMIN D3) 5,000 unit Tab Take 5,000 Units by mouth once daily 30 tablet 2   cyanocobalamin (VITAMIN B12) 1000 MCG tablet Take 1,000 mcg by mouth once daily       dextroamphetamine-amphetamine (ADDERALL) 20 mg tablet Take 20 mg by mouth 3 (three) times daily       esomeprazole (NEXIUM) 40 MG DR capsule Take 40 mg by mouth once daily       FUROsemide (LASIX) 20 MG tablet Take 10 mg by mouth once daily as needed for Edema       insuln adm.sup/lancets (INSULIN ADMIN SUPPL. W/LANCETS MISC) Use       metoprolol tartrate (LOPRESSOR) 25 MG tablet Take 25 mg by mouth 2 (two) times daily       naloxegoL (MOVANTIK) tablet Take by mouth       oxyCODONE (OXYCONTIN) 40 MG CR tablet Take 40 mg by mouth 3 (three) times a day          oxyCODONE (ROXICODONE) 5 MG immediate release tablet Take 1 tablet every 6 hours prn pain, up to 6 tablets per day (Patient taking differently: Take 1 tablet every 6 hours prn pain, up to 4 tablets per day  ) 30 tablet 0   pregabalin (LYRICA) 150 MG capsule Take 150 mg by mouth 2 (two) times daily       QUEtiapine (SEROQUEL) 25 MG tablet Take 1 tablet (25 mg total) by mouth once daily as needed (anxiety or sleep) for up to 90 days 90 tablet 1   sertraline (ZOLOFT) 100 MG tablet Take 2 tablets (200 mg total) by mouth once daily Please call [phone] to make appt with Dr.  Wynetta Emery for future refills 180 tablet 0    No current facility-administered medications on file prior to visit.      History reviewed. No pertinent family history.    Social History       Tobacco Use  Smoking Status Never  Smokeless Tobacco Never      Social History        Socioeconomic History   Marital status: Married  Tobacco Use   Smoking status: Never   Smokeless tobacco: Never  Vaping Use   Vaping Use: Never used  Substance and Sexual Activity   Alcohol use: Never   Drug  use: Never      Objective:         Vitals:    09/02/21 1500  BP: 120/60  Pulse: 70  Temp: 36.3 C (97.3 F)  SpO2: 98%  Weight: 97.3 kg (214 lb 9.6 oz)  Height: 165.1 cm (_0 )    Body mass index is 35.71 kg/m.     Approximately 8 x 6 cm smooth mobile subcutaneous mass in the left anterior lateral thigh, some nearby varicose veins in the soft tissue but otherwise no overlying skin changes.   Assessment and Plan:  Diagnoses and all orders for this visit:   Subcutaneous mass   We discussed excision under MAC.  Discussed technique of surgery and risks of bleeding, infection, pain, scarring, injury to underlying structures, seroma/hematoma, wound healing problems, recurrence of the lesion.  Questions welcomed and answered.  She wishes to proceed.   Allison Goelz Raquel James, MD

## 2021-10-21 NOTE — Anesthesia Preprocedure Evaluation (Addendum)
Anesthesia Evaluation  Patient identified by MRN, date of birth, ID band Patient awake    Reviewed: Allergy & Precautions, NPO status , Patient's Chart, lab work & pertinent test results, reviewed documented beta blocker date and time   History of Anesthesia Complications (+) PONV and history of anesthetic complications  Airway Mallampati: III  TM Distance: >3 FB Neck ROM: Full    Dental  (+) Dental Advisory Given, Lower Dentures, Upper Dentures   Pulmonary sleep apnea , former smoker,    Pulmonary exam normal        Cardiovascular hypertension, Pt. on home beta blockers and Pt. on medications Normal cardiovascular exam+ dysrhythmias Atrial Fibrillation    '17 Nuc Stress - Nuclear stress EF: 64%. There was no ST segment deviation noted during stress. There is a small defect of mild severity present in the apex location. The defect is non-reversible. This is most consistent with diaphragmatic attenuation artifact. No ischemia noted. This is a low risk study.    Neuro/Psych negative neurological ROS  negative psych ROS   GI/Hepatic GERD  Medicated and Controlled, Esophageal stricture, dysmotility     Endo/Other  diabetes, Type 2, Oral Hypoglycemic Agents, Insulin Dependent Obesity   Renal/GU CRFRenal disease     Musculoskeletal  (+) Arthritis , Fibromyalgia -, narcotic dependent  Abdominal   Peds  (+) ADHD Hematology  On eliquis    Anesthesia Other Findings Chronic fatigue   Reproductive/Obstetrics                            Anesthesia Physical Anesthesia Plan  ASA: 3  Anesthesia Plan: General   Post-op Pain Management: Tylenol PO (pre-op)*   Induction: Intravenous  PONV Risk Score and Plan: 4 or greater and Treatment may vary due to age or medical condition, Ondansetron and TIVA  Airway Management Planned: LMA  Additional Equipment: None  Intra-op Plan:    Post-operative Plan: Extubation in OR  Informed Consent: I have reviewed the patients History and Physical, chart, labs and discussed the procedure including the risks, benefits and alternatives for the proposed anesthesia with the patient or authorized representative who has indicated his/her understanding and acceptance.     Dental advisory given  Plan Discussed with: CRNA and Anesthesiologist  Anesthesia Plan Comments:        Anesthesia Quick Evaluation

## 2021-10-21 NOTE — Op Note (Signed)
Operative Note  [name]  [id number]  [id number]  10/21/2021   Surgeon: Romana Juniper MD FACS   Procedure performed: Excision of left thigh subcutaneous mass 8x8x7cm   Preop diagnosis: Left thigh subcutaneous mass Post-op diagnosis/intraop findings: Same   Specimens: Subcutaneous mass Retained items: no  EBL: minimal cc Complications: none   Description of procedure: After obtaining informed consent the patient was taken to the operating room and placed supine on operating room table where general LMA anesthesia was initiated, preoperative antibiotics were administered, SCDs applied, and a formal timeout was performed.  Left thigh was prepped and draped in usual sterile fashion.  After infiltration with local, a longitudinal incision was made over the region of the palpable mass.  The soft tissue was dissected with cautery until the surface of the mass was encountered.  The mass was able to be bluntly dissected from its attachments to the surrounding soft tissue with some cautery through transparent tissues only.  The mass was somewhat friable and did break apart in multiple pieces during extraction, but seem to be fairly discrete from the surrounding soft tissue.  This extended down to the fascia of the thigh but did not extend into it.  Once the entire mass had been removed, hemostasis was ensured within the wound.  The incision was closed with interrupted deep dermal 3-0 Vicryl followed by running subcuticular 4-0 Monocryl.  Benzoin, Steri-Strips and a light pressure dressing of gauze and tape were applied.  The patient was then awakened, extubated and taken to PACU in stable condition.    All counts were correct at the completion of the case.

## 2021-10-21 NOTE — Anesthesia Postprocedure Evaluation (Signed)
Anesthesia Post Note  Patient: [name]  Procedure(s) Performed: EXCISION OF LEFT THIGH MASS (Left: Thigh)     Patient location during evaluation: PACU Anesthesia Type: General Level of consciousness: awake and alert Pain management: pain level controlled Vital Signs Assessment: post-procedure vital signs reviewed and stable Respiratory status: spontaneous breathing, nonlabored ventilation and respiratory function stable Cardiovascular status: stable and blood pressure returned to baseline Anesthetic complications: no   No notable events documented.  Last Vitals:  Vitals:   10/21/21 1215 10/21/21 1308  BP: (!) 113/57 114/63  Pulse: 65 69  Resp: 13 16  Temp:  36.4 C  SpO2: (!) 89% 92%    Last Pain:  Vitals:   10/21/21 1308  TempSrc: Axillary  PainSc:                  Audry Pili

## 2021-10-21 NOTE — Discharge Instructions (Addendum)
GENERAL SURGERY: POST OP INSTRUCTIONS  EAT Gradually transition to a high fiber diet with a fiber supplement over the next few weeks after discharge.  Start with a pureed / full liquid diet (see below)  WALK Walk an hour a day (cumulative, not all at once).  Control your pain to do that.    CONTROL PAIN Control pain so that you can walk, sleep, tolerate sneezing/coughing, go up/down stairs.  HAVE A BOWEL MOVEMENT DAILY Keep your bowels regular to avoid problems.  OK to try a laxative to override constipation.  OK to use an antidairrheal to slow down diarrhea.  Call if not better after 2 tries  CALL IF YOU HAVE PROBLEMS/CONCERNS Call if you are still struggling despite following these instructions. Call if you have concerns not answered by these instructions    DIET: Follow a light bland diet & liquids the first 24 hours after arrival home, such as soup, liquids, starches, etc.  Be sure to drink plenty of fluids.  Quickly advance to a usual solid diet within a few days.  Avoid fast food or heavy meals as your are more likely to get nauseated or have irregular bowels.  A low-sugar, high-fiber diet for the rest of your life is ideal.   Take your usually prescribed home medications unless otherwise directed. PAIN CONTROL: Pain is best controlled by a usual combination of three different methods TOGETHER: Ice/Heat Over the counter pain medication Prescription pain medication Most patients will experience some swelling and bruising around the incisions.  Ice packs or heating pads (30-60 minutes up to 6 times a day) will help. Use ice for the first few days to help decrease swelling and bruising, then switch to heat to help relax tight/sore spots and speed recovery.  Some people prefer to use ice alone, heat alone, alternating between ice & heat.  Experiment to what works for you.  Swelling and bruising can take several weeks to resolve.   It is helpful to take an over-the-counter pain  medication regularly for the first few weeks.  Choose one of the following that works best for you: Naproxen (Aleve, etc)  Two 220mg  tabs twice a day OR Ibuprofen (Advil, etc) Three 200mg  tabs four times a day (every meal & bedtime) AND Acetaminophen (Tylenol, etc) 500-650mg  four times a day (every meal & bedtime) A  prescription for pain medication (such as oxycodone, hydrocodone, etc) should be given to you upon discharge.  Take your pain medication as prescribed.  If you are having problems/concerns with the prescription medicine (does not control pain, nausea, vomiting, rash, itching, etc), please call us [phone] to see if we need to switch you to a different pain medicine that will work better for you and/or control your side effect better. If you need a refill on your pain medication, please contact your pharmacy.  They will contact our office to request authorization. Prescriptions will not be filled after 5 pm or on week-ends. Avoid getting constipated.  Between the surgery and the pain medications, it is common to experience some constipation.  Increasing fluid intake and taking a fiber supplement (such as Metamucil, Citrucel, FiberCon, MiraLax, etc) 1-2 times a day regularly will usually help prevent this problem from occurring.  A mild laxative (prune juice, Milk of Magnesia, MiraLax, etc) should be taken according to package directions if there are no bowel movements after 48 hours.   Wash / shower every day, starting 2 days after surgery.  You may shower over the  steri strips as they are waterproof.  Just let the soap and water run over the incision and pat dry.  Continue to shower over incision(s) after the dressing is off.  No rubbing, scrubbing, lotions or ointments to incision.  Do not soak or submerge. Remove your outer bandage 2 days after surgery; steri strips will peel off after 1-2 weeks.  You may leave the incision open to air.  You may have skin tapes (Steri Strips) covering  the incision(s).  Leave them on until one week, then remove.  You may replace a dressing/Band-Aid to cover the incision for comfort if you wish.      ACTIVITIES as tolerated:   You may resume regular (light) daily activities beginning the next day--such as daily self-care, walking, climbing stairs--gradually increasing activities as tolerated.  If you can walk 30 minutes without difficulty, it is safe to try more intense activity such as jogging, treadmill, bicycling, low-impact aerobics, swimming, etc. Save the most intensive and strenuous activity for last such as sit-ups, heavy lifting, contact sports, etc  Refrain from any heavy lifting or straining until you are off narcotics for pain control.   DO NOT PUSH THROUGH PAIN.  Let pain be your guide: If it hurts to do something, don't do it.  Pain is your body warning you to avoid that activity for another week until the pain goes down. You may drive when you are no longer taking prescription pain medication, you can comfortably wear a seatbelt, and you can safely maneuver your car and apply brakes. You may have sexual intercourse when it is comfortable.  FOLLOW UP in our office Please call [facility] at [phone] to set up an appointment to see your surgeon in the office for a follow-up appointment approximately 2-3 weeks after your surgery. Make sure that you call for this appointment the day you arrive home to insure a convenient appointment time. 9. IF YOU HAVE DISABILITY OR FAMILY LEAVE FORMS, BRING THEM TO THE OFFICE FOR PROCESSING.  DO NOT GIVE THEM TO YOUR DOCTOR.   WHEN TO CALL us [phone]: Poor pain control Reactions / problems with new medications (rash/itching, nausea, etc)  Fever over 101.5 F (38.5 C) Worsening swelling or bruising Continued bleeding from incision. Increased pain, redness, or drainage from the incision Difficulty breathing / swallowing   The clinic staff is available to answer your questions during regular  business hours (8:30am-5pm).  Please don't hesitate to call and ask to speak to one of our nurses for clinical concerns.   If you have a medical emergency, go to the nearest emergency room or call 911.  A surgeon from [facility] is always on call at the [facility], [address], [place], [place], [place]  [id number] ? MAIN: [phone] ? TOLL FREE: [phone] ?  FAX [phone] [email or web address]  Post Anesthesia Home Care Instructions  Activity: Get plenty of rest for the remainder of the day. A responsible individual must stay with you for 24 hours following the procedure.  For the next 24 hours, DO NOT: -Drive a car -Paediatric nurse -Drink alcoholic beverages -Take any medication unless instructed by your physician -Make any legal decisions or sign important papers.  Meals: Start with liquid foods such as gelatin or soup. Progress to regular foods as tolerated. Avoid greasy, spicy, heavy foods. If nausea and/or vomiting occur, drink only clear liquids until the nausea and/or vomiting subsides. Call your physician if vomiting  continues.  Special Instructions/Symptoms: Your throat may feel dry or sore from the anesthesia or the breathing tube placed in your throat during surgery. If this causes discomfort, gargle with warm salt water. The discomfort should disappear within 24 hours.  If you had a scopolamine patch placed behind your ear for the management of post- operative nausea and/or vomiting:  1. The medication in the patch is effective for 72 hours, after which it should be removed.  Wrap patch in a tissue and discard in the trash. Wash hands thoroughly with soap and water. 2. You may remove the patch earlier than 72 hours if you experience unpleasant side effects which may include dry mouth, dizziness or visual disturbances. 3. Avoid touching the patch. Wash your hands with soap and water after contact  with the patch.

## 2021-10-21 NOTE — Transfer of Care (Signed)
Immediate Anesthesia Transfer of Care Note  Patient: [name]  Procedure(s) Performed: EXCISION OF LEFT THIGH MASS (Left: Thigh)  Patient Location: PACU  Anesthesia Type:General  Level of Consciousness: awake, alert , oriented and patient cooperative  Airway & Oxygen Therapy: Patient Spontanous Breathing  Post-op Assessment: Report given to RN and Post -op Vital signs reviewed and stable  Post vital signs: Reviewed and stable  Last Vitals:  Vitals Value Taken Time  BP    Temp    Pulse 65 10/21/21 1140  Resp 19 10/21/21 1140  SpO2 93 % 10/21/21 1140  Vitals shown include unvalidated device data.  Last Pain:  Vitals:   10/21/21 0903  TempSrc: Oral  PainSc: 3       Patients Stated Pain Goal: 1 (41/32/44 0102)  Complications: No notable events documented.

## 2021-10-24 DIAGNOSIS — H26491 Other secondary cataract, right eye: Secondary | ICD-10-CM | POA: Diagnosis not present

## 2021-10-24 DIAGNOSIS — H18423 Band keratopathy, bilateral: Secondary | ICD-10-CM | POA: Diagnosis not present

## 2021-10-24 LAB — SURGICAL PATHOLOGY

## 2021-10-26 ENCOUNTER — Encounter: Payer: Medicare Other | Admitting: Podiatry

## 2021-10-26 ENCOUNTER — Ambulatory Visit: Payer: Medicare Other | Admitting: Nurse Practitioner

## 2021-10-26 ENCOUNTER — Encounter (HOSPITAL_BASED_OUTPATIENT_CLINIC_OR_DEPARTMENT_OTHER): Payer: Self-pay | Admitting: Surgery

## 2021-10-26 ENCOUNTER — Ambulatory Visit (INDEPENDENT_AMBULATORY_CARE_PROVIDER_SITE_OTHER): Payer: Medicare Other | Admitting: Pulmonary Disease

## 2021-10-26 VITALS — BP 120/70 | HR 67 | Temp 97.5°F | Ht 64.5 in | Wt 206.0 lb

## 2021-10-26 DIAGNOSIS — Z7189 Other specified counseling: Secondary | ICD-10-CM

## 2021-10-26 DIAGNOSIS — G4733 Obstructive sleep apnea (adult) (pediatric): Secondary | ICD-10-CM | POA: Diagnosis not present

## 2021-10-26 NOTE — Progress Notes (Signed)
h  [facility]  Chief Complaint  Patient presents with   Follow-up    She wants to talk about inspire device, and go over sleep test as well.     Past Surgical History:  She  has a past surgical history that includes Cardioversion (07/28/2011); Total abdominal hysterectomy w/ bilateral salpingoophorectomy (1995); Tubal ligation (1983); Cataract extraction w/ intraocular lens implant (Bilateral); Thyroidectomy (Right, 1987); Esophagogastroduodenoscopy (egd) with propofol (N/A, 12/03/2012); Balloon dilation (N/A, 12/03/2012); Esophageal manometry (N/A, 05/19/2013); Colonoscopy with propofol (N/A, 03/29/2015); Retinal detachment surgery (Left, 11/2016); Breast excisional biopsy (Left); Laparoscopic cholecystectomy (07/2014); Cardiac catheterization (1989); and Excision mass lower extremeties (Left, 10/21/2021).  Past Medical History:  Cataract, DM type 2, HLD, A fib, Fibromyalgia, GERD, HTN, MVP, Vit D deficiency  Constitutional:  BP 120/70 (BP Location: Left Arm, Cuff Size: Normal)   Pulse 67   Temp (!) 97.5 F (36.4 C) (Oral)   Ht 5' 4.5" (1.638 m)   Wt 206 lb (93.4 kg)   SpO2 96%   BMI 34.81 kg/m   Brief Summary:  [name] is a 76 y.o. female with obstructive sleep apnea.      Subjective:   She is here with her husband.  She did home sleep study in June.  This showed mild sleep apnea.  She has trouble staying asleep and feels tired and fatigued during the day.  Her previous CPAP set up is from about 15 years ago.  She had trouble with mask fit back then and hasn't used CPAP for a while.  Physical Exam:   Appearance - well kempt   ENMT - no sinus tenderness, no oral exudate, no LAN, Mallampati 3 airway, no stridor, wears dentures, narrow nasal passages  Respiratory - equal breath sounds bilaterally, no wheezing or rales  CV - s1s2 regular rate and rhythm, no murmurs  Ext - no clubbing, no edema  Skin - no  rashes  Psych - normal mood and affect   Pulmonary testing:  PFT 04/29/15 >> FEV1 2.75 (114%), FEV1% 89, TLC 4.73 (91%), DLCO 92%  Sleep Tests:  PSG 06/05/16 >> AHI 20, SpO2 low 78% PSG 09/08/16 >> AHI 14, SpO2 low 80% HST 10/05/21 >> AHI 10.8, SpO2 low 85%  Cardiac Tests:  Echo 02/10/16 >> EF 60 to 65%  Social History:  She  reports that she quit smoking about 51 years ago. Her smoking use included cigarettes. She has never used smokeless tobacco. She reports that she does not drink alcohol and does not use drugs.  Family History:  Her family history includes Atrial fibrillation in her father; Breast cancer in her maternal aunt; Bronchitis in her father; Diabetes type II in her father; Heart disease in her father; Hypertension in her mother; Hyperthyroidism in her mother; Kidney cancer in her sister.     Assessment/Plan:   Obstructive sleep apnea. - reviewed her sleep study - discussed how sleep apnea can impact her health - treatment options reviewed; explained she isn't a candidate for an Inspire device since she has mild sleep apnea - she is willing to try CPAP again - will arrange for Resmed auto CPAP 5 to 15 cm H2O; she would like to use [facility] for her DME  Allergic rhinitis. - prn astelin, flonase  Obesity. - discussed how weight can impact sleep and risk for sleep disordered breathing - discussed options to assist with weight loss: combination of diet modification, cardiovascular and strength training exercises  Time Spent  Involved in Patient Care on Day of Examination:  27 minutes  Follow up:   Patient Instructions  Will arrange for auto CPAP set up  Follow up in 4 months  Medication List:   Allergies as of 10/26/2021       Reactions   Iohexol Other (See Comments)    Desc: EYES & LIPS SWELLING, SOB DURING A MYELOGRAM '88, DECADRON GIVEN/ PRE MEDS REQUIRED/A.C., Onset Date: [id number]   Morphine And Related Other (See Comments)   Hyperactivity /  "climbs the wall's"   Penicillins Hives, Swelling   Has patient had a PCN reaction causing immediate rash, facial/tongue/throat swelling, SOB or lightheadedness with hypotension: Yes Has patient had a PCN reaction causing severe rash involving mucus membranes or skin necrosis: No Has patient had a PCN reaction that required hospitalization No Has patient had a PCN reaction occurring within the last 10 years: No If all of the above answers are "NO", then may proceed with Cephalosporin use.   Shellfish Allergy Hives, Swelling   Hard shellfish   Demerol Nausea And Vomiting   Can take with Phenergan   Avandia [rosiglitazone] Swelling   Other reaction(s): Weight gain and edema   Latex Rash   Contact dermatitis   Pradaxa [dabigatran Etexilate Mesylate] Other (See Comments)   Headaches   Victoza [liraglutide] Other (See Comments)   headaches        Medication List        Accurate as of October 26, 2021  5:37 PM. If you have any questions, ask your nurse or doctor.          acetaminophen 500 MG tablet Commonly known as: TYLENOL Take 500 mg by mouth every 6 (six) hours as needed for mild pain or fever.   amiodarone 200 MG tablet Commonly known as: PACERONE Take 0.5 tablets (100 mg total) by mouth daily.   amphetamine-dextroamphetamine 20 MG tablet Commonly known as: ADDERALL Take 20 mg by mouth 3 (three) times daily as needed.   atorvastatin 80 MG tablet Commonly known as: LIPITOR Take 80 mg by mouth at bedtime.   azelastine 0.1 % nasal spray Commonly known as: ASTELIN Place 1 spray into both nostrils daily. Use in each nostril as directed What changed:  when to take this reasons to take this   Basaglar KwikPen 100 UNIT/ML Inject 45 Units into the skin daily.   BD Pen Needle Nano U/F 32G X 4 MM Misc Generic drug: Insulin Pen Needle   buPROPion 300 MG 24 hr tablet Commonly known as: WELLBUTRIN XL Take 300 mg by mouth every morning.   Eliquis 5 MG Tabs  tablet Generic drug: apixaban TAKE 1 TABLET TWICE A DAY What changed:  how much to take additional instructions   esomeprazole 40 MG capsule Commonly known as: NEXIUM TAKE 1 CAPSULE TWICE DAILY BEFORE MEALS What changed: See the new instructions.   Farxiga 10 MG Tabs tablet Generic drug: dapagliflozin propanediol Take 10 mg by mouth daily.   fluticasone 50 MCG/ACT nasal spray Commonly known as: FLONASE Place 1 spray into both nostrils daily. What changed:  when to take this reasons to take this   FREESTYLE LITE test strip Generic drug: glucose blood 4 (four) times daily.   furosemide 20 MG tablet Commonly known as: LASIX Take 0.5 tablets (10 mg total) by mouth daily as needed for fluid.   insulin lispro 100 UNIT/ML injection Commonly known as: HUMALOG Inject 10-25 Units into the skin 3 (three) times daily. Inject 10-25 units into  the skin 3 times daily  per SS   ketoconazole 2 % cream Commonly known as: NIZORAL Apply 1 Application topically as needed.   Melatonin 10 MG Tbdp Take 1 capsule by mouth at bedtime as needed.   metoprolol tartrate 25 MG tablet Commonly known as: LOPRESSOR Take 1 tablet (25 mg total) by mouth 2 (two) times daily.   METRONIDAZOLE EX Apply topically as needed (rosacea).   Movantik 12.5 MG Tabs tablet Generic drug: naloxegol oxalate Take 12.5 mg by mouth daily.   nitroGLYCERIN 0.4 MG SL tablet Commonly known as: NITROSTAT Place 0.4 mg under the tongue as needed.   OxyCONTIN 30 MG 12 hr tablet Generic drug: oxyCODONE Take 1 tablet by mouth 2 (two) times daily.   oxyCODONE 5 MG immediate release tablet Commonly known as: Oxy IR/ROXICODONE Take 5 mg by mouth every 6 (six) hours as needed (for breakthrough pain).   Ozempic (0.25 or 0.5 MG/DOSE) 2 MG/1.5ML Sopn Generic drug: Semaglutide(0.25 or 0.'5MG'$ /DOS) Tuesday's  (as of 10-19-2021 on 0.'5mg'$ )   pregabalin 150 MG capsule Commonly known as: LYRICA Take 150 mg by mouth 2 (two)  times daily.   sertraline 100 MG tablet Commonly known as: ZOLOFT Take 200 mg by mouth 2 (two) times daily.   tiZANidine 2 MG tablet Commonly known as: ZANAFLEX Take 2 mg by mouth 3 (three) times daily as needed.   Vitamin D 125 MCG (5000 UT) Caps Take 5,000 Units by mouth daily.        Signature:  Chesley Mires, MD [place] Pager - [phone] 10/26/2021, 5:37 PM

## 2021-10-26 NOTE — Patient Instructions (Signed)
Will arrange for auto CPAP set up  Follow up in 4 months 

## 2021-10-27 ENCOUNTER — Other Ambulatory Visit: Payer: Self-pay | Admitting: Gastroenterology

## 2021-11-02 DIAGNOSIS — H538 Other visual disturbances: Secondary | ICD-10-CM | POA: Diagnosis not present

## 2021-11-02 DIAGNOSIS — Z961 Presence of intraocular lens: Secondary | ICD-10-CM | POA: Diagnosis not present

## 2021-11-02 DIAGNOSIS — H04123 Dry eye syndrome of bilateral lacrimal glands: Secondary | ICD-10-CM | POA: Diagnosis not present

## 2021-11-02 DIAGNOSIS — H18523 Epithelial (juvenile) corneal dystrophy, bilateral: Secondary | ICD-10-CM | POA: Diagnosis not present

## 2021-11-10 ENCOUNTER — Ambulatory Visit: Payer: Medicare Other | Admitting: Podiatry

## 2021-11-11 IMAGING — MG MM DIGITAL SCREENING BILAT W/ TOMO AND CAD
6 of 10 series · 6 of 30 positions shown · non-contrast
Comparison: Previous exam(s).

CLINICAL DATA: Screening.

EXAM:
DIGITAL SCREENING BILATERAL MAMMOGRAM WITH TOMOSYNTHESIS AND CAD
TECHNIQUE: Bilateral screening digital craniocaudal and mediolateral oblique
mammograms were obtained. Bilateral screening digital breast
tomosynthesis was performed. The images were evaluated with
computer-aided detection.

[L MLO synth-2D (1 of 2)]
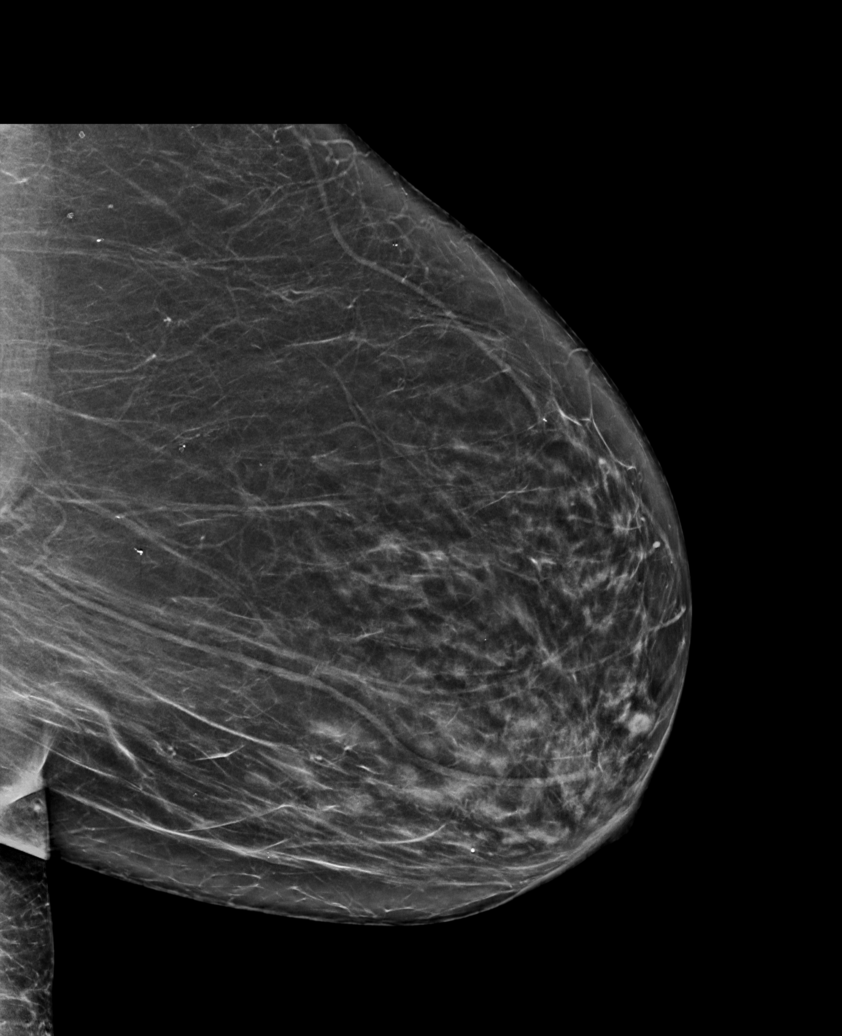

[R MLO synth-2D]
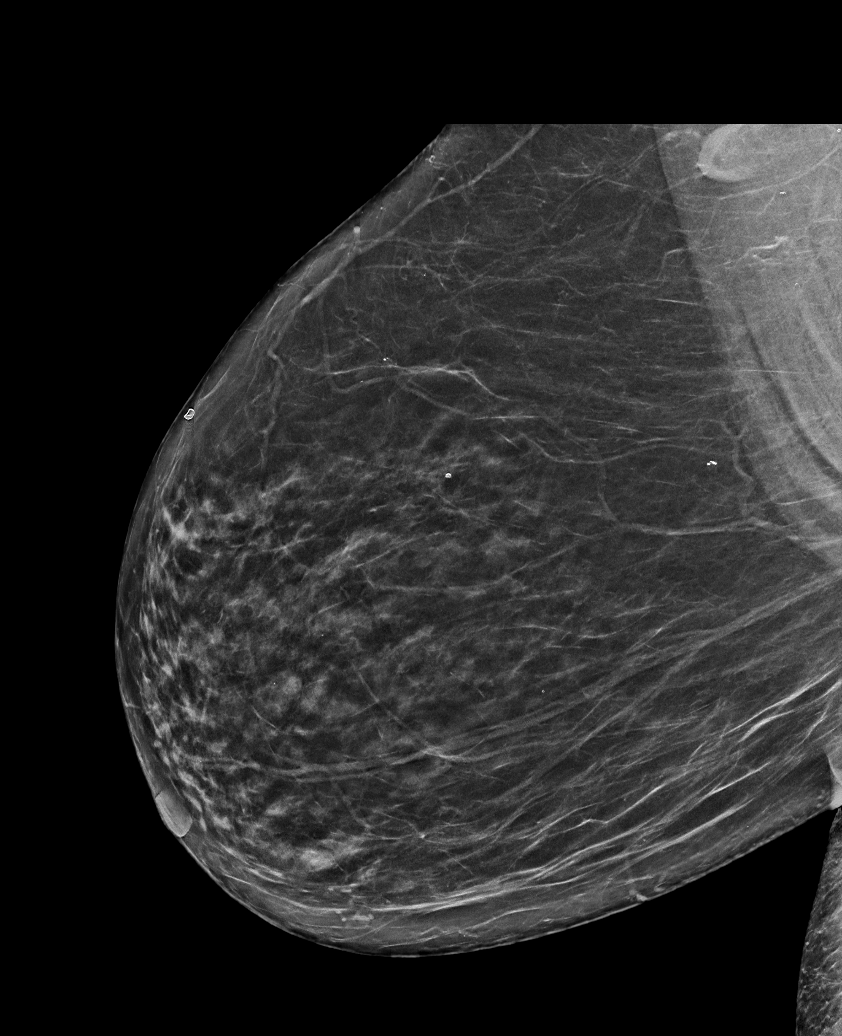

[L CC synth-2D]
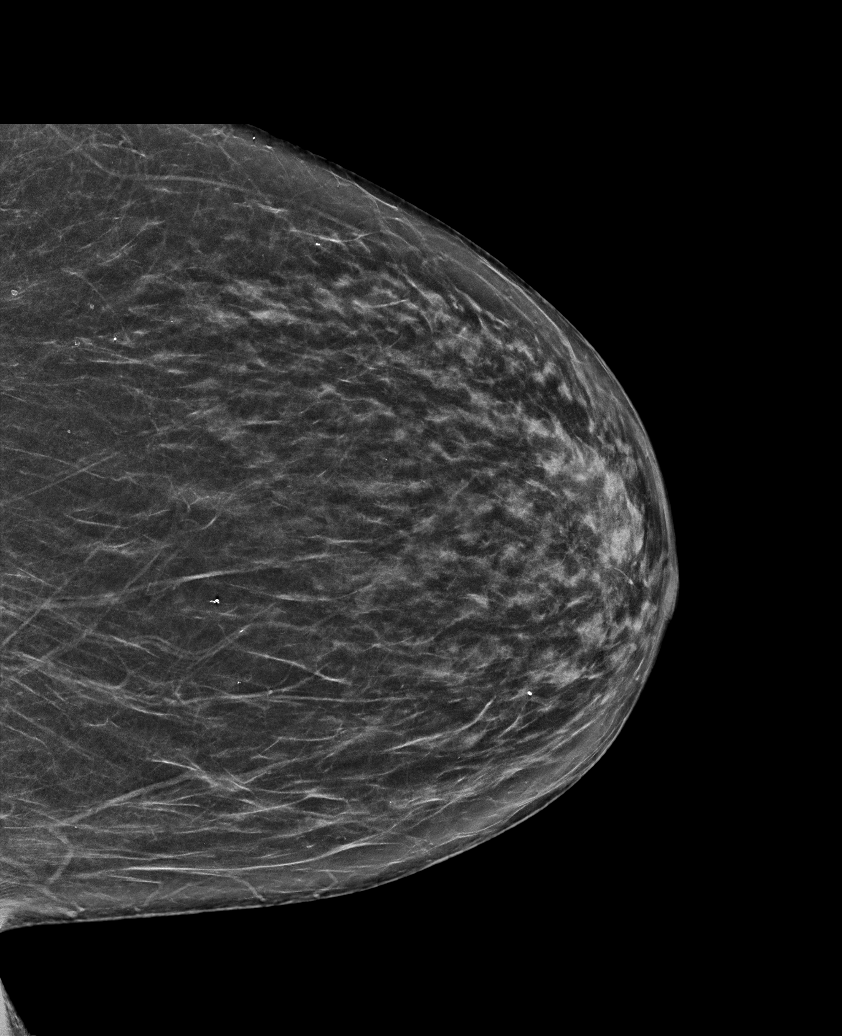

[R CC synth-2D]
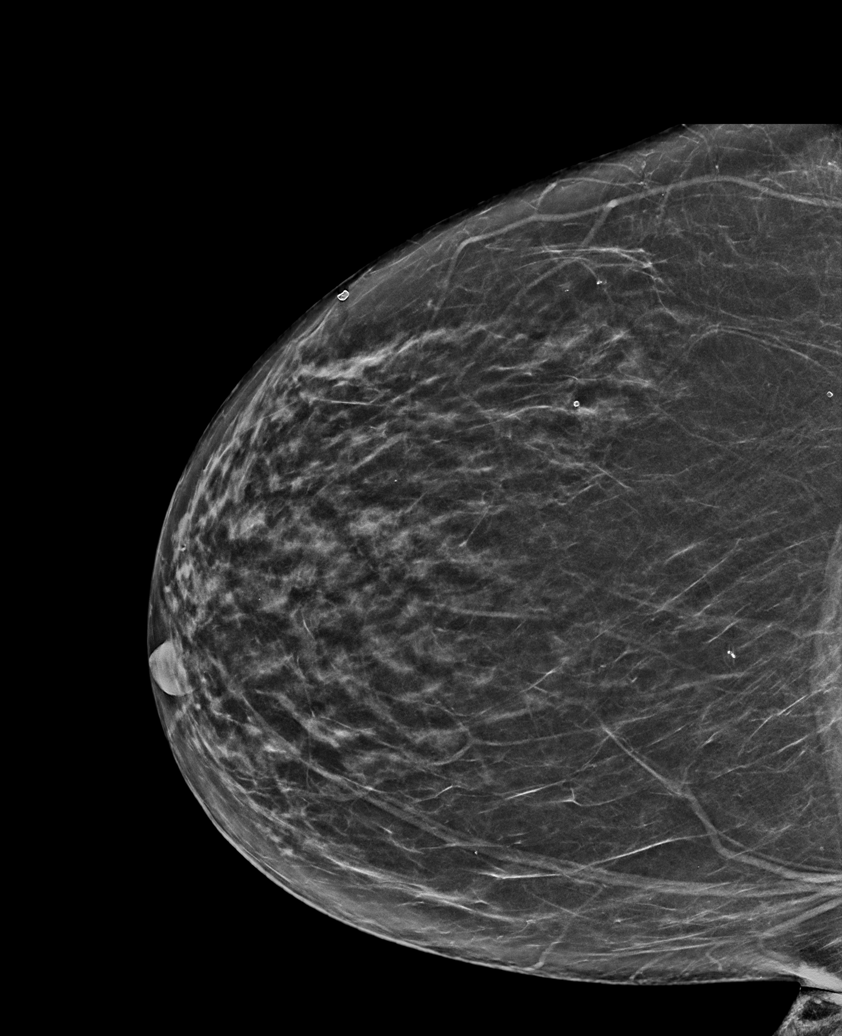

[L MLO synth-2D (2 of 2)]
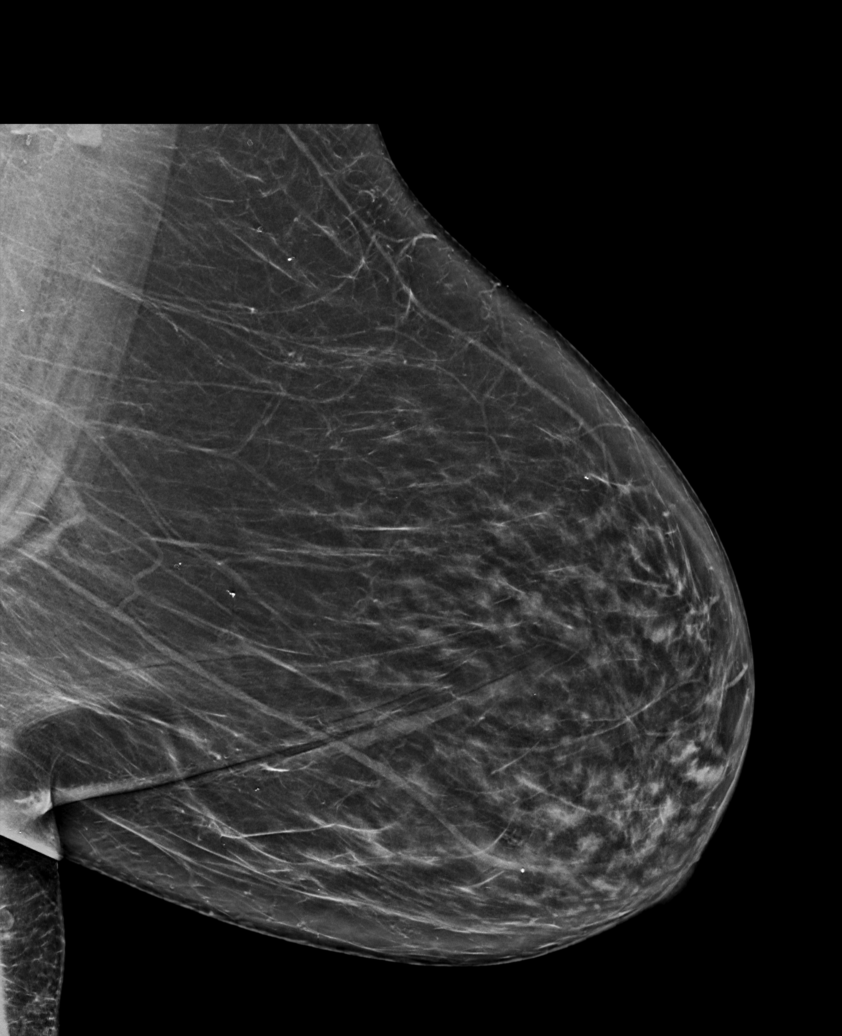

[L MLO tomo · tomo slice 37/72.0]
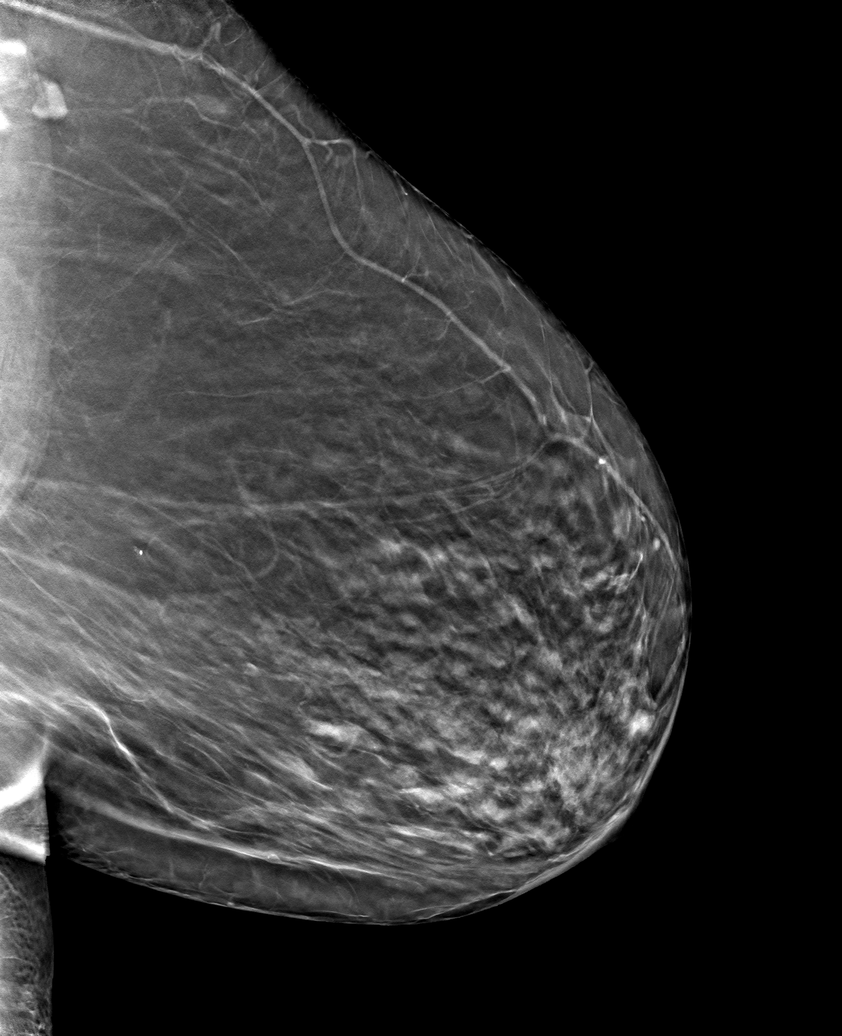

[6 of 30 positions shown; findings below may reference images not displayed]

ACR Breast Density Category b: There are scattered areas of
fibroglandular density.
FINDINGS: There are no findings suspicious for malignancy.
IMPRESSION: No mammographic evidence of malignancy. A result letter of this
screening mammogram will be mailed directly to the patient.

RECOMMENDATION:
Screening mammogram in one year. (Code:51-O-LD2)

BI-RADS CATEGORY  1: Negative.

## 2021-11-29 ENCOUNTER — Other Ambulatory Visit: Payer: Self-pay

## 2021-11-29 ENCOUNTER — Emergency Department (HOSPITAL_BASED_OUTPATIENT_CLINIC_OR_DEPARTMENT_OTHER): Payer: Medicare Other | Admitting: Radiology

## 2021-11-29 ENCOUNTER — Emergency Department (HOSPITAL_BASED_OUTPATIENT_CLINIC_OR_DEPARTMENT_OTHER)
Admission: EM | Admit: 2021-11-29 | Discharge: 2021-11-29 | Disposition: A | Discharge status: Home / Self Care | Payer: Medicare Other | Attending: Emergency Medicine | Admitting: Emergency Medicine

## 2021-11-29 ENCOUNTER — Emergency Department (HOSPITAL_BASED_OUTPATIENT_CLINIC_OR_DEPARTMENT_OTHER): Payer: Medicare Other

## 2021-11-29 ENCOUNTER — Encounter (HOSPITAL_BASED_OUTPATIENT_CLINIC_OR_DEPARTMENT_OTHER): Payer: Self-pay

## 2021-11-29 DIAGNOSIS — Z79899 Other long term (current) drug therapy: Secondary | ICD-10-CM | POA: Diagnosis not present

## 2021-11-29 DIAGNOSIS — S0083XA Contusion of other part of head, initial encounter: Secondary | ICD-10-CM | POA: Insufficient documentation

## 2021-11-29 DIAGNOSIS — Z91040 Latex allergy status: Secondary | ICD-10-CM | POA: Insufficient documentation

## 2021-11-29 DIAGNOSIS — M25561 Pain in right knee: Secondary | ICD-10-CM | POA: Insufficient documentation

## 2021-11-29 DIAGNOSIS — M542 Cervicalgia: Secondary | ICD-10-CM | POA: Insufficient documentation

## 2021-11-29 DIAGNOSIS — W01198A Fall on same level from slipping, tripping and stumbling with subsequent striking against other object, initial encounter: Secondary | ICD-10-CM | POA: Diagnosis not present

## 2021-11-29 DIAGNOSIS — Z7901 Long term (current) use of anticoagulants: Secondary | ICD-10-CM | POA: Insufficient documentation

## 2021-11-29 DIAGNOSIS — Z794 Long term (current) use of insulin: Secondary | ICD-10-CM | POA: Diagnosis not present

## 2021-11-29 DIAGNOSIS — W19XXXA Unspecified fall, initial encounter: Secondary | ICD-10-CM

## 2021-11-29 DIAGNOSIS — Z043 Encounter for examination and observation following other accident: Secondary | ICD-10-CM | POA: Diagnosis not present

## 2021-11-29 DIAGNOSIS — S0990XA Unspecified injury of head, initial encounter: Secondary | ICD-10-CM | POA: Diagnosis not present

## 2021-11-29 DIAGNOSIS — Z7984 Long term (current) use of oral hypoglycemic drugs: Secondary | ICD-10-CM | POA: Diagnosis not present

## 2021-11-29 NOTE — Discharge Instructions (Addendum)
Recommend follow-up outpatient with [facility].  Continue to have a knee immobilizer in place, utilize a walker for ambulation.  Tylenol and NSAIDs for pain control.

## 2021-11-29 NOTE — ED Triage Notes (Signed)
Patient here POV from Home.  Endorses Falling Twice (Once Yesterday and once more this AM) Due to her Right Knee giving Way.   Head Injury against Coffee Table. No LOC. Takes Eliquis for Atrial. Fib. Also endorses Neck Tenderness.   NAD Noted during Triage A&Ox4. GCS 15. Ambulatory.

## 2021-11-29 NOTE — ED Provider Notes (Signed)
[facility] DEPT Provider Note   CSN: [id number] Arrival date & time: 11/29/21  1713     History  Chief Complaint  Patient presents with   Fredrich Romans AMARYA KUEHL is a 76 y.o. female.   Fall    76 year old female presenting to the emergency department POV from home as a nonlevel trauma after 2 falls.  The patient states that she is fallen twice in the last 2 days.  Once yesterday and once this morning.  Both times she felt like her right knee gave way.  She states that she is on Eliquis for atrial fibrillation.  Yesterday she sustained a head injury against a coffee table with no loss of consciousness.  She injured her knee after it gave way and had trouble bearing weight on the right lower extremity and due to this presented to [facility] earlier this morning.  She was placed in a knee immobilizer and advised to present to the emergency department as she did fall and hit her head while on anticoagulation.  She denies any headaches, blurry vision.  No other injuries or complaints.  She endorse some neck tenderness.  She arrived GCS 15, ABC intact.  Home Medications Prior to Admission medications   Medication Sig Start Date End Date Taking? Authorizing Provider  acetaminophen (TYLENOL) 500 MG tablet Take 500 mg by mouth every 6 (six) hours as needed for mild pain or fever.     [provider]  amiodarone (PACERONE) 200 MG tablet Take 0.5 tablets (100 mg total) by mouth daily. Patient taking differently: Take 100 mg by mouth daily. 12/23/20   Belva Crome, MD  amphetamine-dextroamphetamine (ADDERALL) 20 MG tablet Take 20 mg by mouth 3 (three) times daily as needed.    [provider]  atorvastatin (LIPITOR) 80 MG tablet Take 80 mg by mouth at bedtime.    [provider]  azelastine (ASTELIN) 0.1 % nasal spray Place 1 spray into both nostrils daily. Use in each nostril as directed Patient taking differently: Place 1 spray into both  nostrils daily as needed. Use in each nostril as directed 08/10/21   Chesley Mires, MD  BD PEN NEEDLE NANO U/F 32G X 4 MM MISC  03/22/13   [provider]  buPROPion (WELLBUTRIN XL) 300 MG 24 hr tablet Take 300 mg by mouth every morning. 09/19/19   [provider]  Cholecalciferol (VITAMIN D) 125 MCG (5000 UT) CAPS Take 5,000 Units by mouth daily.     [provider]  ELIQUIS 5 MG TABS tablet TAKE 1 TABLET TWICE A DAY Patient taking differently: Take 5 mg by mouth 2 (two) times daily. Pt aware to stop 2 days prior to surgery on 10-21-2021 by cardiology.  Last does 10-18-2021 09/07/21   Belva Crome, MD  esomeprazole (NEXIUM) 40 MG capsule TAKE 1 CAPSULE TWICE DAILY BEFORE MEALS Patient taking differently: Take 40 mg by mouth daily. 12/07/17   Nandigam, Venia Minks, MD  FARXIGA 10 MG TABS tablet Take 10 mg by mouth daily. 08/05/21   [provider]  fluticasone (FLONASE) 50 MCG/ACT nasal spray Place 1 spray into both nostrils daily. Patient taking differently: Place 1 spray into both nostrils daily as needed. 08/10/21   Chesley Mires, MD  FREESTYLE LITE test strip 4 (four) times daily. 09/26/19   [provider]  furosemide (LASIX) 20 MG tablet Take 0.5 tablets (10 mg total) by mouth daily as needed for fluid. Patient taking differently: Take 10  mg by mouth daily as needed for fluid. 12/30/19   Belva Crome, MD  Insulin [facility] KWIKPEN) 100 UNIT/ML SOPN Inject 45 Units into the skin daily.    [provider]  insulin lispro (HUMALOG) 100 UNIT/ML injection Inject 10-25 Units into the skin 3 (three) times daily. Inject 10-25 units into the skin 3 times daily  per SS    [provider]  ketoconazole (NIZORAL) 2 % cream Apply 1 Application topically as needed.    [provider]  Melatonin 10 MG TBDP Take 1 capsule by mouth at bedtime as needed.    [provider]  metoprolol tartrate (LOPRESSOR) 25 MG tablet Take 1 tablet  (25 mg total) by mouth 2 (two) times daily. Patient taking differently: Take 25 mg by mouth 2 (two) times daily. 06/13/21   Belva Crome, MD  METRONIDAZOLE EX Apply topically as needed (rosacea).    [provider]  MOVANTIK 12.5 MG TABS tablet Take 12.5 mg by mouth daily. 07/19/17   [provider]  nitroGLYCERIN (NITROSTAT) 0.4 MG SL tablet Place 0.4 mg under the tongue as needed.    [provider]  oxyCODONE (OXY IR/ROXICODONE) 5 MG immediate release tablet Take 5 mg by mouth every 6 (six) hours as needed (for breakthrough pain).    [provider]  OXYCONTIN 30 MG 12 hr tablet Take 1 tablet by mouth 2 (two) times daily. 05/10/21   [provider]  pregabalin (LYRICA) 150 MG capsule Take 150 mg by mouth 2 (two) times daily.    [provider]  Semaglutide,0.25 or 0.'5MG'$ /DOS, (OZEMPIC, 0.25 OR 0.5 MG/DOSE,) 2 MG/1.5ML SOPN Tuesday's  (as of 10-19-2021 on 0.'5mg'$ ) 09/06/21   [provider]  sertraline (ZOLOFT) 100 MG tablet Take 200 mg by mouth 2 (two) times daily. 06/11/21   [provider]  tiZANidine (ZANAFLEX) 2 MG tablet Take 2 mg by mouth 3 (three) times daily as needed. 09/26/19   [provider]  estrogens, conjugated, (PREMARIN) 0.9 MG tablet Take 0.9 mg by mouth daily.  07/18/11  [provider]  glimepiride (AMARYL) 4 MG tablet Take 4 mg by mouth 2 (two) times daily.   07/18/11  [provider]  metFORMIN (GLUCOPHAGE) 500 MG tablet Take 1,000 mg by mouth 2 (two) times daily with a meal.    07/18/11  [provider]      Allergies    Iohexol, Morphine and related, Penicillins, Shellfish allergy, Demerol, Avandia [rosiglitazone], Latex, Pradaxa [dabigatran etexilate mesylate], and Victoza [liraglutide]    Review of Systems   Review of Systems  All other systems reviewed and are negative.   Physical Exam Updated Vital Signs BP 99/80   Pulse 65   Temp 97.9 F (36.6 C)   Resp 17    Ht 5' 4.5" (1.638 m)   Wt 93.4 kg   SpO2 95%   BMI 34.80 kg/m  Physical Exam Vitals and nursing note reviewed.  Constitutional:      General: She is not in acute distress.    Appearance: She is well-developed.     Comments: GCS 15, ABC intact  HENT:     Head: Normocephalic.     Comments: Bruising to the left forehead Eyes:     Extraocular Movements: Extraocular movements intact.     Conjunctiva/sclera: Conjunctivae normal.     Pupils: Pupils are equal, round, and reactive to light.  Neck:     Comments: C-collar in place.  No midline  tenderness to palpation of the cervical spine.  Range of motion intact Cardiovascular:     Rate and Rhythm: Normal rate and regular rhythm.  Pulmonary:     Effort: Pulmonary effort is normal. No respiratory distress.     Breath sounds: Normal breath sounds.  Chest:     Comments: Clavicles stable nontender to AP compression.  Chest wall stable and nontender to AP and lateral compression. Abdominal:     Palpations: Abdomen is soft.     Tenderness: There is no abdominal tenderness.  Musculoskeletal:     Cervical back: Neck supple.     Comments: No midline tenderness to palpation of the thoracic or lumbar spine.  Extremities atraumatic with intact range of motion, right knee immobilizer in place, no surrounding tenderness, no deformity, distally neurovascular intact  Skin:    General: Skin is warm and dry.  Neurological:     Mental Status: She is alert.     Comments: Cranial nerves II through XII grossly intact.  Moving all 4 extremities spontaneously.  Sensation grossly intact all 4 extremities     ED Results / Procedures / Treatments   Labs (all labs ordered are listed, but only abnormal results are displayed) Labs Reviewed - No data to display  EKG None  Radiology DG Knee Complete 4 Views Right  Result Date: 11/29/2021 CLINICAL DATA:  Fall EXAM: RIGHT KNEE - COMPLETE 4+ VIEW COMPARISON:  None Available. FINDINGS: No acute fracture or  dislocation.Tricompartmental joint space narrowing with spurring.Alignment is unremarkable.No significant soft tissue abnormality or foreign body. Loose body in the posterior soft tissues. IMPRESSION: No acute osseous abnormality. Electronically Signed   By: Merilyn Baba M.D.   On: 11/29/2021 19:02   CT Head Wo Contrast  Result Date: 11/29/2021 CLINICAL DATA:  Two recent falls due to right knee giving way. Injury to head against coffee table. Patient on Eliquis. EXAM: CT HEAD WITHOUT CONTRAST CT CERVICAL SPINE WITHOUT CONTRAST TECHNIQUE: Multidetector CT imaging of the head and cervical spine was performed following the standard protocol without intravenous contrast. Multiplanar CT image reconstructions of the cervical spine were also generated. RADIATION DOSE REDUCTION: This exam was performed according to the departmental dose-optimization program which includes automated exposure control, adjustment of the mA and/or kV according to patient size and/or use of iterative reconstruction technique. COMPARISON:  None. FINDINGS: CT HEAD FINDINGS Brain: Ventricles, cisterns and other CSF spaces are within normal. There is mild chronic ischemic microvascular disease. No mass, mass effect, shift of midline structures or acute hemorrhage. No evidence of acute infarction. Vascular: No hyperdense vessel or unexpected calcification. Skull: Normal. Negative for fracture or focal lesion. Sinuses/Orbits: No acute finding. Other: None. CT CERVICAL SPINE FINDINGS Alignment: Normal. Skull base and vertebrae: Vertebral body heights are normal. There is moderate spondylosis of the cervical spine with prominent anterior osteophytes from C4-C7. Atlantoaxial articulation is normal. Minimal facet arthropathy is present. No significant neural foraminal narrowing. No acute fracture. Soft tissues and spinal canal: No prevertebral fluid or swelling. No visible canal hematoma. Disc levels:  No significant disc space narrowing. Upper chest:  No acute findings.  Previous right thyroidectomy. Other: None. IMPRESSION: 1. No acute brain injury. 2. Mild chronic ischemic microvascular disease. 3. No acute cervical spine injury. 4. Moderate spondylosis of the cervical spine. Electronically Signed   By: Marin Olp M.D.   On: 11/29/2021 18:44   CT Cervical Spine Wo Contrast  Result Date: 11/29/2021 CLINICAL DATA:  Two recent falls due to right knee giving  way. Injury to head against coffee table. Patient on Eliquis. EXAM: CT HEAD WITHOUT CONTRAST CT CERVICAL SPINE WITHOUT CONTRAST TECHNIQUE: Multidetector CT imaging of the head and cervical spine was performed following the standard protocol without intravenous contrast. Multiplanar CT image reconstructions of the cervical spine were also generated. RADIATION DOSE REDUCTION: This exam was performed according to the departmental dose-optimization program which includes automated exposure control, adjustment of the mA and/or kV according to patient size and/or use of iterative reconstruction technique. COMPARISON:  None. FINDINGS: CT HEAD FINDINGS Brain: Ventricles, cisterns and other CSF spaces are within normal. There is mild chronic ischemic microvascular disease. No mass, mass effect, shift of midline structures or acute hemorrhage. No evidence of acute infarction. Vascular: No hyperdense vessel or unexpected calcification. Skull: Normal. Negative for fracture or focal lesion. Sinuses/Orbits: No acute finding. Other: None. CT CERVICAL SPINE FINDINGS Alignment: Normal. Skull base and vertebrae: Vertebral body heights are normal. There is moderate spondylosis of the cervical spine with prominent anterior osteophytes from C4-C7. Atlantoaxial articulation is normal. Minimal facet arthropathy is present. No significant neural foraminal narrowing. No acute fracture. Soft tissues and spinal canal: No prevertebral fluid or swelling. No visible canal hematoma. Disc levels:  No significant disc space narrowing.  Upper chest: No acute findings.  Previous right thyroidectomy. Other: None. IMPRESSION: 1. No acute brain injury. 2. Mild chronic ischemic microvascular disease. 3. No acute cervical spine injury. 4. Moderate spondylosis of the cervical spine. Electronically Signed   By: Marin Olp M.D.   On: 11/29/2021 18:44    Procedures Procedures    Medications Ordered in ED Medications - No data to display  ED Course/ Medical Decision Making/ A&P                           Medical Decision Making Amount and/or Complexity of Data Reviewed Radiology: ordered.   76 year old female presenting to the emergency department POV from home as a nonlevel trauma after 2 falls.  The patient states that she is fallen twice in the last 2 days.  Once yesterday and once this morning.  Both times she felt like her right knee gave way.  She states that she is on Eliquis for atrial fibrillation.  Yesterday she sustained a head injury against a coffee table with no loss of consciousness.  She injured her knee after it gave way and had trouble bearing weight on the right lower extremity and due to this presented to [facility] earlier this morning.  She was placed in a knee immobilizer and advised to present to the emergency department as she did fall and hit her head while on anticoagulation.  She denies any headaches, blurry vision.  No other injuries or complaints.  She endorse some neck tenderness.  She arrived GCS 15, ABC intact.  On arrival, the patient was vitally stable.  Physical exam with some right knee tenderness to palpation.   Trauma imaging revealed (full reports in EMR): CT head: No acute intracranial abnormality CT cervical spine: No fracture or malalignment of the cervical spine. X-ray right knee: No acute fracture or dislocation  Patient has no tenderness about the tibial plateau.  Low concern for occult fracture or tibial plateau fracture.  Patient may have sustained ligamentous injury.  Will continue  to have the patient maintain a knee immobilizer per [facility].  Informed the patient of lack of evidence of any acute intracranial abnormality, no fracture on CT of the cervical spine.  Patient c-collar cleared following negative imaging, no midline tenderness to palpation.  Patient advised to use a walker for continued ambulation, outpatient follow-up with orthopedics.  Stable for discharge.   Final Clinical Impression(s) / ED Diagnoses Final diagnoses:  Fall, initial encounter  Acute pain of right knee    Rx / DC Orders ED Discharge Orders     None         Regan Lemming, MD 11/29/21 1947

## 2021-12-01 ENCOUNTER — Other Ambulatory Visit: Payer: Self-pay

## 2021-12-01 MED ORDER — FUROSEMIDE 20 MG PO TABS: 10.0000 mg | ORAL_TABLET | Freq: Every day | ORAL | 2 refills | Status: AC | PRN

## 2021-12-01 NOTE — Telephone Encounter (Signed)
Pt's medication was sent to pt's pharmacy as requested. Confirmation received.  °

## 2021-12-30 DIAGNOSIS — K5900 Constipation, unspecified: Secondary | ICD-10-CM | POA: Diagnosis not present

## 2021-12-30 DIAGNOSIS — I480 Paroxysmal atrial fibrillation: Secondary | ICD-10-CM | POA: Diagnosis not present

## 2021-12-30 DIAGNOSIS — Z794 Long term (current) use of insulin: Secondary | ICD-10-CM | POA: Diagnosis not present

## 2021-12-30 DIAGNOSIS — N1831 Chronic kidney disease, stage 3a: Secondary | ICD-10-CM | POA: Diagnosis not present

## 2021-12-30 DIAGNOSIS — M810 Age-related osteoporosis without current pathological fracture: Secondary | ICD-10-CM | POA: Diagnosis not present

## 2021-12-30 DIAGNOSIS — E1142 Type 2 diabetes mellitus with diabetic polyneuropathy: Secondary | ICD-10-CM | POA: Diagnosis not present

## 2021-12-30 DIAGNOSIS — N189 Chronic kidney disease, unspecified: Secondary | ICD-10-CM | POA: Diagnosis not present

## 2021-12-30 DIAGNOSIS — Z23 Encounter for immunization: Secondary | ICD-10-CM | POA: Diagnosis not present

## 2022-01-04 DIAGNOSIS — Z961 Presence of intraocular lens: Secondary | ICD-10-CM | POA: Diagnosis not present

## 2022-01-04 DIAGNOSIS — H18523 Epithelial (juvenile) corneal dystrophy, bilateral: Secondary | ICD-10-CM | POA: Diagnosis not present

## 2022-01-16 ENCOUNTER — Ambulatory Visit: Payer: Medicare Other | Admitting: Interventional Cardiology

## 2022-01-17 ENCOUNTER — Other Ambulatory Visit: Payer: Self-pay | Admitting: *Deleted

## 2022-01-17 DIAGNOSIS — N1832 Chronic kidney disease, stage 3b: Secondary | ICD-10-CM | POA: Diagnosis not present

## 2022-01-17 DIAGNOSIS — I480 Paroxysmal atrial fibrillation: Secondary | ICD-10-CM

## 2022-01-17 MED ORDER — AMIODARONE HCL 200 MG PO TABS: 100.0000 mg | ORAL_TABLET | Freq: Every day | ORAL | 3 refills | Status: DC

## 2022-01-18 DIAGNOSIS — N2581 Secondary hyperparathyroidism of renal origin: Secondary | ICD-10-CM | POA: Diagnosis not present

## 2022-01-18 DIAGNOSIS — N1832 Chronic kidney disease, stage 3b: Secondary | ICD-10-CM | POA: Diagnosis not present

## 2022-01-18 DIAGNOSIS — M1990 Unspecified osteoarthritis, unspecified site: Secondary | ICD-10-CM | POA: Diagnosis not present

## 2022-01-18 DIAGNOSIS — K220 Achalasia of cardia: Secondary | ICD-10-CM | POA: Diagnosis not present

## 2022-01-18 DIAGNOSIS — E1122 Type 2 diabetes mellitus with diabetic chronic kidney disease: Secondary | ICD-10-CM | POA: Diagnosis not present

## 2022-01-18 DIAGNOSIS — E875 Hyperkalemia: Secondary | ICD-10-CM | POA: Diagnosis not present

## 2022-01-23 DIAGNOSIS — H18523 Epithelial (juvenile) corneal dystrophy, bilateral: Secondary | ICD-10-CM | POA: Diagnosis not present

## 2022-01-30 DIAGNOSIS — H18529 Epithelial (juvenile) corneal dystrophy, unspecified eye: Secondary | ICD-10-CM | POA: Diagnosis not present

## 2022-01-30 DIAGNOSIS — Z09 Encounter for follow-up examination after completed treatment for conditions other than malignant neoplasm: Secondary | ICD-10-CM | POA: Diagnosis not present

## 2022-01-31 ENCOUNTER — Encounter (HOSPITAL_COMMUNITY): Payer: Self-pay | Admitting: Gastroenterology

## 2022-01-31 NOTE — Progress Notes (Signed)
Attempted to obtain medical history via telephone, unable to reach at this time. Unable to leave voicemail to return pre surgical testing department's phone call,due to mailbox full.  

## 2022-02-03 NOTE — Progress Notes (Signed)
Attempted to obtain medical history via telephone, unable to reach at this time. Unable to leave voicemail to return pre surgical testing department's phone call,due to mailbox full.  Did reach out to office and let them know unable to reach about important info for procedure. Office stated would try her and if able to reach would give her my number to call.

## 2022-02-06 NOTE — H&P (Signed)
HPI: 76 year old female returns for one month follow-up of dysphagia, and chronic constipation.   Barium swallow with tablet 09/07/21 showed moderate narrowing of the distal esophagus, concerning for distal esophageal stricture. Moderate esophageal dysmotility. No evidence of achalasia.   She has family history of grandfather who had colon cancer.   She tried samples of Linzess 258mg which did not help her constipation and she discontinued.  Previously Movantik worked better.  She is requesting refill of Movantik.  Currentlly also takes Oxycontin 13mBID.  She had a hard BM yesterday.  Was on Miralax in the past.         Med History significant for AFib and is on Eliquis. Cardiologist Dr. HeDaneen Schick       Last colonoscopy done by Dr. JoWynetta Emery2/2016 showed poor colon prep and one small 5 mm benign polyp removed from the hepatic flexure. Repeat colonoscopy was recommended in one year, due to poor prep. Patient did not followup with repeat colonoscopy.  Paternal grandfather had colon cancer.        She has had difficulty swallowing for many years. S he was diagnosed with achalasia by Dr. JoWynetta Emeryn 2014 - 2015.  She had EGD w/ DIL 11/2012.  Esophageal Manometry 04/2013. [facility] S he has managed her dysphagia with dietary changes.  She avoids large pieces of meat. Eats slowly. Dysphagia is worse if she eats too fast.    Current Medications  Atorvastatin Calcium 80 MG Tablet TAKE 1 TABLET ONCE A DAY Basaglar KwikPen(Insulin Glargine) 100 UNIT/ML Solution Pen-injector 40 units Subcutaneous once a day Diabetic Shoes one pair of shoes and three pair of inserts . for type 2 diabetes with peripheral neuropathy and pre-ulcer calluses Farxiga(Dapagliflozin Propanediol) 10 MG Tablet 1 tablet Orally Once a day HumaLOG KwikPen(Insulin Lispro) 100 UNIT/ML Solution Pen-injector 10 units Subcutaneous at meals three times a  day NexIUM(Esomeprazole Magnesium) 40MG Enteric Coated Capsule 1 capsule Orally Once a day Ozempic 0.25 or 0.5 MG/DOSE Solution Pen-injector Titrate 0.25 mg once weekly for 4 weeks then 0.5 mg once weekly for 2 weeks Subcutaneous once a weekQC Unifine Pentips(Insulin Pen Needle) 32G X 4 MM Miscellaneous USE AS DIRECTED tiZANidine HCl 2 mg Tablet TAKE ONE TABLET BY MOUTH THREE TIMES DAILY AS NEEDED FreeStyle Lite w/Device Kit . . check three times a day, on insulin, ICD-10 code E11.9FreeStyle Lite Test(Blood Glucose Test) - Strip USE TO TEST BLOOD GLUCOSE FOUR TIMES DAILY Lyrica(Pregabalin) 150 MG Capsule 1 capsule Orally Twice a day OxyContin 30 MG(oxyCODONE HCl ER) 15 MG Tablet ER 12 Hour Abuse-Deterrent 1/2 tablet Orally twice a day MetroCream(metroNIDAZOLE) 0.2.95 Cream 1 application Externally Twice a dayZoloft 100 mg 2 tab Oral Adderall(Amphetamine-Dextroamphetamine) 20 MG Tablet 1 tablets Orally as needed,  Notes: as neededAmiodarone HCl 200 MG Tablet 1/2 tablet Orally once a day buPROPion HCl ER (XL) 300 MG Tablet Extended Release 24 Hour 1 tablet in the morning Orally Once a day FreeStyle Lancets . . . . check blood sugar four times a day, type 2 diabetes on insulin ICD-10: E11.65FreeStyle Lite Test(Blood Glucose Test) DX: E11.65 Test Strips . To obtain blood four times a day to check blood sugars Ketoconazole 2 % Cream 1 application to affected area Externally Twice a day Melatonin 10 MG Tablet Disintegrating 1 capsule at bedtime as needed Orally Once a day Metoprolol Tartrate 25 MG Tablet 1 tablet with food Orally twice a day Movantik(Naloxegol Oxalate) 12.5 MG  Tablet 1 tablet in the morning Orally Once a day oxyCODONE HCl 5 MG Tablet 1 tablet Orally every 6 hrs prnSaline  Nasal Spray 0.65 % Solution 2 sprays in each nostril as needed Nasally before meals and bedtime Vitamin D3 50 MCG (2000 UT) Capsule 1 capsule Orally Once a day Eliquis(Apixaban) 5 MG Tablet 1 tablet Orally twice a day,  Notes: Dr. Mallie Mussel SmithFurosemide 20 MG Tablet 1/2 tablet Orally as needed Flonase(Fluticasone Propionate) 50 MCG/DOSE Inhaler 1 spray in each nostril Nasally Once a day Dexcom G6 Sensor - Miscellaneous . . change sensor every 10 days, ICD-10 code E11.9, on insulin three times a dayDexcom G6 Transmitter - Miscellaneous . . change transmitter every 90 days, ICD-10 code   Past Medical History      type 2 diabetes mellitus- Dr Buddy Duty.      Fibromyalgia.      Chronic fatigue syndrome.      Endometriosis.      had a hernia with GERD.      ADD.      osteoarthritis cervical spine MRI disc bulge right C5-C6 no nerve root impingement.      [facility] .      opthal Dr Samara Snide.      ortho Dr Amedeo Plenty.      derm [facility] in [place].      dentist Dr Ezzie Dural.      chronic fatigue Dr Jeral Fruit.      Esophogeal spasm uses nitro.      sleep apnea cpap Dr Annamaria Boots.      afib on 05/2011...cardioversion 2013 to NSR.     cards Dr Tamala Julian.      stage III renal disease Dr Harrie Jeans.      February/2015 carpal tunnel syndrome on the left-Dr. Nelva Bush.      Hypercholesterolemia.       FH colon CA colon polyp 03/2015 poor prep repeat one year.           Osteopenia 04/2015 progression with 4.6% hip fracture risk over 10 years on 09/2020 consider alendronate after dental extraction.      Achalasia.      Paroxysmal afib.      psych Dr Cheryl [facility] at [facility].  Surgical History       benign breast biopsy       bilateral tubal ligation 1983       hysterectomy 1978       cardiac catheter 1989       thyroidectomy, right 1987       cataract surgery left eye 01/2012       lap chole Dr Feliberto Gottron 07/2014       vitrectomy left eye 11/2016       Left hand pinky finger   Vital Signs Wt 213, Ht 64, BMI 36.56, Temp 97.4, Pulse sitting 64, BP sitting 120/77. Examination Gastroenterology Exam:        GENERAL APPEARANCE: Well developed, well nourished, no active distress, pleasant, no acute distress.          SCLERA: anicteric.         RESPIRATORY Breath sounds clear to auscultation. No wheezes, rales or rhonchi. Respiration even and unlabored.         CARDIOVASCULAR Normal RRR w/o murmers or gallops. No peripheral edema.         ABDOMEN No masses palpated. Liver and spleen not palpated, normal. Bowel sounds normal, Abdomen not distended , soft, nontender.   Assessments 1. Chronic  idiopathic constipation - K59.00 (Primary) 2. Constipation due to opioid therapy - K59.09 3. Dysphagia - R13.10 4. Esophageal stricture - K22.2 5. Family history of colon cancer - Z80.0 6. Afib - I48.91, On Eliquis; Cardiologist Dr. Daneen Schick  Treatment 1. Chronic idiopathic constipation Start Polyethylene Glycol 3350 Powder, 17gm mixed in liquid, Orally, twice a day, 90 days, 3168 Gram, Refills 3  2. Constipation due to opioid therapy Start Movantik Tablet, 25 MG, 1 tablet in the morning, Orally, Once a day, 90 days, 90 Tablet, Refills 3  3. Esophageal stricture       IMAGING: EGD w/ DILATATION            I  discussed risks of EGD and Dilation with patient today, including risk of sedation, bleeding or perforation. Patient expressed understanding and agrees to proceed with procedure.    4. Family history of colon cancer       IMAGING: Colonoscopy             I discussed risks and benefits of colonoscopy procedure with patient to include risk of bleeding or colon perforation. Patient expressed understanding and agrees to proceed with procedure.    5. Afib Notes: We are requesting cardiac permission to hold Eliquis 2 days prior to procedures.

## 2022-02-07 ENCOUNTER — Ambulatory Visit (HOSPITAL_COMMUNITY)
Admission: RE | Admit: 2022-02-07 | Discharge: 2022-02-07 | Disposition: A | Discharge status: Home / Self Care | Payer: Medicare Other | Source: Ambulatory Visit | Attending: Gastroenterology | Admitting: Gastroenterology

## 2022-02-07 ENCOUNTER — Ambulatory Visit (HOSPITAL_COMMUNITY): Payer: Medicare Other | Admitting: Certified Registered Nurse Anesthetist

## 2022-02-07 ENCOUNTER — Other Ambulatory Visit: Payer: Self-pay

## 2022-02-07 ENCOUNTER — Ambulatory Visit (HOSPITAL_BASED_OUTPATIENT_CLINIC_OR_DEPARTMENT_OTHER): Payer: Medicare Other | Admitting: Certified Registered Nurse Anesthetist

## 2022-02-07 ENCOUNTER — Encounter (HOSPITAL_COMMUNITY): Payer: Self-pay | Admitting: Gastroenterology

## 2022-02-07 ENCOUNTER — Encounter (HOSPITAL_COMMUNITY)
Admission: RE | Disposition: A | Discharge status: Home / Self Care | Payer: Self-pay | Source: Ambulatory Visit | Attending: Gastroenterology

## 2022-02-07 DIAGNOSIS — Z79899 Other long term (current) drug therapy: Secondary | ICD-10-CM | POA: Diagnosis not present

## 2022-02-07 DIAGNOSIS — K2100 Gastro-esophageal reflux disease with esophagitis, without bleeding: Secondary | ICD-10-CM | POA: Insufficient documentation

## 2022-02-07 DIAGNOSIS — I10 Essential (primary) hypertension: Secondary | ICD-10-CM | POA: Diagnosis not present

## 2022-02-07 DIAGNOSIS — Z7901 Long term (current) use of anticoagulants: Secondary | ICD-10-CM | POA: Diagnosis not present

## 2022-02-07 DIAGNOSIS — Z09 Encounter for follow-up examination after completed treatment for conditions other than malignant neoplasm: Secondary | ICD-10-CM | POA: Diagnosis not present

## 2022-02-07 DIAGNOSIS — K3189 Other diseases of stomach and duodenum: Secondary | ICD-10-CM | POA: Insufficient documentation

## 2022-02-07 DIAGNOSIS — D124 Benign neoplasm of descending colon: Secondary | ICD-10-CM | POA: Insufficient documentation

## 2022-02-07 DIAGNOSIS — K621 Rectal polyp: Secondary | ICD-10-CM | POA: Diagnosis not present

## 2022-02-07 DIAGNOSIS — F909 Attention-deficit hyperactivity disorder, unspecified type: Secondary | ICD-10-CM | POA: Diagnosis not present

## 2022-02-07 DIAGNOSIS — K2289 Other specified disease of esophagus: Secondary | ICD-10-CM | POA: Insufficient documentation

## 2022-02-07 DIAGNOSIS — Z1211 Encounter for screening for malignant neoplasm of colon: Secondary | ICD-10-CM | POA: Diagnosis not present

## 2022-02-07 DIAGNOSIS — Z6835 Body mass index (BMI) 35.0-35.9, adult: Secondary | ICD-10-CM | POA: Diagnosis not present

## 2022-02-07 DIAGNOSIS — E119 Type 2 diabetes mellitus without complications: Secondary | ICD-10-CM | POA: Insufficient documentation

## 2022-02-07 DIAGNOSIS — D120 Benign neoplasm of cecum: Secondary | ICD-10-CM | POA: Insufficient documentation

## 2022-02-07 DIAGNOSIS — Z800 Family history of malignant neoplasm of digestive organs: Secondary | ICD-10-CM | POA: Insufficient documentation

## 2022-02-07 DIAGNOSIS — K319 Disease of stomach and duodenum, unspecified: Secondary | ICD-10-CM | POA: Diagnosis not present

## 2022-02-07 DIAGNOSIS — I4891 Unspecified atrial fibrillation: Secondary | ICD-10-CM | POA: Diagnosis not present

## 2022-02-07 DIAGNOSIS — Z86010 Personal history of colonic polyps: Secondary | ICD-10-CM | POA: Diagnosis not present

## 2022-02-07 DIAGNOSIS — G4730 Sleep apnea, unspecified: Secondary | ICD-10-CM | POA: Insufficient documentation

## 2022-02-07 DIAGNOSIS — M797 Fibromyalgia: Secondary | ICD-10-CM | POA: Insufficient documentation

## 2022-02-07 DIAGNOSIS — Z7984 Long term (current) use of oral hypoglycemic drugs: Secondary | ICD-10-CM | POA: Insufficient documentation

## 2022-02-07 DIAGNOSIS — K220 Achalasia of cardia: Secondary | ICD-10-CM | POA: Diagnosis not present

## 2022-02-07 DIAGNOSIS — K635 Polyp of colon: Secondary | ICD-10-CM | POA: Diagnosis not present

## 2022-02-07 DIAGNOSIS — Z87891 Personal history of nicotine dependence: Secondary | ICD-10-CM | POA: Diagnosis not present

## 2022-02-07 DIAGNOSIS — E669 Obesity, unspecified: Secondary | ICD-10-CM | POA: Diagnosis not present

## 2022-02-07 DIAGNOSIS — K648 Other hemorrhoids: Secondary | ICD-10-CM | POA: Insufficient documentation

## 2022-02-07 DIAGNOSIS — K209 Esophagitis, unspecified without bleeding: Secondary | ICD-10-CM | POA: Diagnosis not present

## 2022-02-07 DIAGNOSIS — D123 Benign neoplasm of transverse colon: Secondary | ICD-10-CM | POA: Diagnosis not present

## 2022-02-07 DIAGNOSIS — Z794 Long term (current) use of insulin: Secondary | ICD-10-CM | POA: Insufficient documentation

## 2022-02-07 HISTORY — PX: POLYPECTOMY: SHX5525

## 2022-02-07 HISTORY — PX: COLONOSCOPY WITH PROPOFOL: SHX5780

## 2022-02-07 HISTORY — PX: BIOPSY: SHX5522

## 2022-02-07 HISTORY — PX: BOTOX INJECTION: SHX5754

## 2022-02-07 HISTORY — PX: ESOPHAGOGASTRODUODENOSCOPY (EGD) WITH PROPOFOL: SHX5813

## 2022-02-07 LAB — GLUCOSE, CAPILLARY: Glucose-Capillary: 117 mg/dL — ABNORMAL HIGH (ref 70–99)

## 2022-02-07 SURGERY — ESOPHAGOGASTRODUODENOSCOPY (EGD) WITH PROPOFOL
Anesthesia: Monitor Anesthesia Care

## 2022-02-07 MED ORDER — SODIUM CHLORIDE (PF) 0.9 % IJ SOLN
INTRAMUSCULAR | Status: DC | PRN
  Administered 2022-02-07: 4 mL via SUBMUCOSAL

## 2022-02-07 MED ORDER — ONABOTULINUMTOXINA 100 UNITS IJ SOLR
INTRAMUSCULAR | Status: AC
  Filled 2022-02-07: qty 100

## 2022-02-07 MED ORDER — LIDOCAINE 2% (20 MG/ML) 5 ML SYRINGE
INTRAMUSCULAR | Status: DC | PRN
  Administered 2022-02-07: 100 mg via INTRAVENOUS

## 2022-02-07 MED ORDER — SODIUM CHLORIDE (PF) 0.9 % IJ SOLN
INTRAMUSCULAR | Status: AC
  Filled 2022-02-07: qty 10

## 2022-02-07 MED ORDER — SODIUM CHLORIDE 0.9 % IV SOLN: INTRAVENOUS | Status: DC

## 2022-02-07 MED ORDER — PROPOFOL 500 MG/50ML IV EMUL
INTRAVENOUS | Status: DC | PRN
  Administered 2022-02-07: 150 ug/kg/min via INTRAVENOUS

## 2022-02-07 MED ORDER — LACTATED RINGERS IV SOLN
INTRAVENOUS | Status: DC
  Administered 2022-02-07: 1000 mL via INTRAVENOUS

## 2022-02-07 MED ORDER — PROPOFOL 10 MG/ML IV BOLUS
INTRAVENOUS | Status: DC | PRN
  Administered 2022-02-07: 20 mg via INTRAVENOUS

## 2022-02-07 SURGICAL SUPPLY — 25 items

## 2022-02-07 NOTE — Transfer of Care (Signed)
Immediate Anesthesia Transfer of Care Note  Patient: [name]  Procedure(s) Performed: ESOPHAGOGASTRODUODENOSCOPY (EGD) WITH PROPOFOL COLONOSCOPY WITH PROPOFOL BOTOX INJECTION BIOPSY POLYPECTOMY  Patient Location: Endoscopy Unit  Anesthesia Type:MAC  Level of Consciousness: awake, alert  and oriented  Airway & Oxygen Therapy: Patient Spontanous Breathing and Patient connected to face mask oxygen  Post-op Assessment: Report given to RN and Post -op Vital signs reviewed and stable  Post vital signs: Reviewed and stable  Last Vitals:  Vitals Value Taken Time  BP 114/72   Temp    Pulse 63 02/07/22 0922  Resp 15 02/07/22 0922  SpO2 100 % 02/07/22 0922  Vitals shown include unvalidated device data.  Last Pain:  Vitals:   02/07/22 0740  TempSrc: Oral  PainSc: 0-No pain         Complications: No notable events documented.

## 2022-02-07 NOTE — Discharge Instructions (Signed)

## 2022-02-07 NOTE — Anesthesia Postprocedure Evaluation (Signed)
Anesthesia Post Note  Patient: [name]  Procedure(s) Performed: ESOPHAGOGASTRODUODENOSCOPY (EGD) WITH PROPOFOL COLONOSCOPY WITH PROPOFOL BOTOX INJECTION BIOPSY POLYPECTOMY     Patient location during evaluation: PACU Anesthesia Type: MAC Level of consciousness: awake and alert Pain management: pain level controlled Vital Signs Assessment: post-procedure vital signs reviewed and stable Respiratory status: spontaneous breathing, nonlabored ventilation and respiratory function stable Cardiovascular status: blood pressure returned to baseline and stable Postop Assessment: no apparent nausea or vomiting Anesthetic complications: no   No notable events documented.  Last Vitals:  Vitals:   02/07/22 0932 02/07/22 0942  BP: (!) 103/38 (!) 110/53  Pulse: 62 (!) 59  Resp: 17 13  Temp:    SpO2: 96% 97%    Last Pain:  Vitals:   02/07/22 0942  TempSrc:   PainSc: 0-No pain                 Lynda Rainwater

## 2022-02-07 NOTE — Interval H&P Note (Signed)
History and Physical Interval Note: 76/female on eliquis(last dose 2 days ago), history of achalasia for EGD with balloon dilation and botox injection and colonoscopy for history of colon polyps.  02/07/2022 7:48 AM  [name]  has presented today for EGD and colonoscopy, with the diagnosis of Achalasia, Family History of Colon Cancer,Esophageal Stricture.  The various methods of treatment have been discussed with the patient and family. After consideration of risks, benefits and other options for treatment, the patient has consented to  Procedure(s): ESOPHAGOGASTRODUODENOSCOPY (EGD) WITH PROPOFOL (N/A) BALLOON DILATION (N/A) COLONOSCOPY WITH PROPOFOL (N/A) BOTOX INJECTION (N/A) as a surgical intervention.  The patient's history has been reviewed, patient examined, no change in status, stable for surgery.  I have reviewed the patient's chart and labs.  Questions were answered to the patient's satisfaction.     [name]

## 2022-02-07 NOTE — Anesthesia Preprocedure Evaluation (Signed)
Anesthesia Evaluation  Patient identified by MRN, date of birth, ID band Patient awake    Reviewed: Allergy & Precautions, NPO status , Patient's Chart, lab work & pertinent test results, reviewed documented beta blocker date and time   History of Anesthesia Complications (+) PONV and history of anesthetic complications  Airway Mallampati: III  TM Distance: >3 FB Neck ROM: Full    Dental  (+) Dental Advisory Given, Lower Dentures, Upper Dentures   Pulmonary sleep apnea , former smoker,    Pulmonary exam normal        Cardiovascular hypertension, Pt. on home beta blockers and Pt. on medications Normal cardiovascular exam+ dysrhythmias Atrial Fibrillation    '17 Nuc Stress - Nuclear stress EF: 64%. There was no ST segment deviation noted during stress. There is a small defect of mild severity present in the apex location. The defect is non-reversible. This is most consistent with diaphragmatic attenuation artifact. No ischemia noted. This is a low risk study.    Neuro/Psych negative neurological ROS  negative psych ROS   GI/Hepatic GERD  Medicated and Controlled, Esophageal stricture, dysmotility     Endo/Other  diabetes, Type 2, Oral Hypoglycemic Agents, Insulin Dependent Obesity   Renal/GU CRFRenal disease     Musculoskeletal  (+) Arthritis , Fibromyalgia -  Abdominal (+) + obese,   Peds  (+) ADHD Hematology  On eliquis    Anesthesia Other Findings Chronic fatigue   Reproductive/Obstetrics                             Anesthesia Physical  Anesthesia Plan  ASA: 3  Anesthesia Plan: MAC   Post-op Pain Management: Minimal or no pain anticipated   Induction: Intravenous  PONV Risk Score and Plan: 3 and Treatment may vary due to age or medical condition, Ondansetron, TIVA and Midazolam  Airway Management Planned: Nasal Cannula  Additional Equipment: None  Intra-op Plan:    Post-operative Plan:   Informed Consent: I have reviewed the patients History and Physical, chart, labs and discussed the procedure including the risks, benefits and alternatives for the proposed anesthesia with the patient or authorized representative who has indicated his/her understanding and acceptance.     Dental advisory given  Plan Discussed with: CRNA and Anesthesiologist  Anesthesia Plan Comments:         Anesthesia Quick Evaluation

## 2022-02-07 NOTE — Op Note (Signed)
[facility] Patient Name: Notnamed Croucher Procedure Date: 02/07/2022 MRN: [id number] Attending MD: [name] , MD Date of Birth: 07/04/1945 CSN: [id number] Age: 76 Admit Type: Outpatient Procedure:                Colonoscopy Indications:              High risk colon cancer surveillance: Personal                            history of colonic polyps, Last colonoscopy: 2016,                            Family history of colon cancer in a distant relative Providers:                [name], MD, Dulcy Fanny, Janee Morn,                            Technician Referring MD:             Theone Stanley Medicines:                Monitored Anesthesia Care Complications:            No immediate complications. Estimated blood loss:                            Minimal. Estimated Blood Loss:     Estimated blood loss was minimal. Procedure:                Pre-Anesthesia Assessment:                           - Prior to the procedure, a History and Physical                            was performed, and patient medications and                            allergies were reviewed. The patient's tolerance of                            previous anesthesia was also reviewed. The risks                            and benefits of the procedure and the sedation                            options and risks were discussed with the patient.                            All questions were answered, and informed consent                            was obtained. Prior Anticoagulants: The patient has  taken Eliquis (apixaban), last dose was 2 days                            prior to procedure. ASA Grade Assessment: III - A                            patient with severe systemic disease. After                            reviewing the risks and benefits, the patient was                            deemed in satisfactory condition to undergo the                             procedure.                           - Prior to the procedure, a History and Physical                            was performed, and patient medications and                            allergies were reviewed. The patient's tolerance of                            previous anesthesia was also reviewed. The risks                            and benefits of the procedure and the sedation                            options and risks were discussed with the patient.                            All questions were answered, and informed consent                            was obtained. Prior Anticoagulants: The patient has                            taken Eliquis (apixaban), last dose was 2 days                            prior to procedure. ASA Grade Assessment: III - A                            patient with severe systemic disease. After                            reviewing the risks and benefits, the patient was  deemed in satisfactory condition to undergo the                            procedure.                           After obtaining informed consent, the colonoscope                            was passed under direct vision. Throughout the                            procedure, the patient's blood pressure, pulse, and                            oxygen saturations were monitored continuously. The                            [id number]-HQ190L ([id number] Olympus colonoscope was                            introduced through the anus and advanced to the the                            cecum, identified by appendiceal orifice and                            ileocecal valve. The colonoscopy was performed                            without difficulty. The patient tolerated the                            procedure well. The quality of the bowel                            preparation was fair. Scope In: 8:55:59 AM Scope Out: 9:16:24 AM Scope Withdrawal Time: 0 hours 16 minutes 29  seconds  Total Procedure Duration: 0 hours 20 minutes 25 seconds  Findings:      The perianal and digital rectal examinations were normal.      Two sessile polyps were found in the descending colon and transverse       colon. The polyps were 7 to 9 mm in size. These polyps were removed with       a hot snare and a cold biopsy forceps. Resection and retrieval were       complete.      Two sessile polyps were found in the hepatic flexure and cecum. The       polyps were 5 to 6 mm in size. These polyps were removed with a       piecemeal technique using a cold biopsy forceps. Resection and retrieval       were complete.      Many hyperplastic polyps were found in the rectum. The polyps were 2 to       4 mm in size.      Non-bleeding internal  hemorrhoids were found during retroflexion. Impression:               - Preparation of the colon was fair.                           - Two 7 to 9 mm polyps in the descending colon and                            in the transverse colon, removed with a hot snare.                            Resected and retrieved.                           - Two 5 to 6 mm polyps at the hepatic flexure and                            in the cecum, removed piecemeal using a cold biopsy                            forceps. Resected and retrieved.                           - Many 2 to 4 mm polyps in the rectum.                           - Non-bleeding internal hemorrhoids. Moderate Sedation:      Patient did not receive moderate sedation for this procedure, but       instead received monitored anesthesia care. Recommendation:           - Patient has a contact number available for                            emergencies. The signs and symptoms of potential                            delayed complications were discussed with the                            patient. Return to normal activities tomorrow.                            Written discharge instructions were provided to the                             patient.                           - Resume regular diet.                           - Continue present medications.                           - Await  pathology results.                           - Repeat colonoscopy for surveillance based on                            pathology results.                           - Resume Eliquis (apixaban) at prior dose tomorrow. Procedure Code(s):        --- Professional ---                           [phone], Colonoscopy, flexible; with removal of                            tumor(s), polyp(s), or other lesion(s) by snare                            technique                           45380, 55, Colonoscopy, flexible; with biopsy,                            single or multiple Diagnosis Code(s):        --- Professional ---                           Z86.010, Personal history of colonic polyps                           K64.8, Other hemorrhoids                           K63.5, Polyp of colon                           K62.1, Rectal polyp                           Z80.0, Family history of malignant neoplasm of                            digestive organs CPT copyright 2019 [employer]. All rights reserved. The codes documented in this report are preliminary and upon coder review may  be revised to meet current compliance requirements. [name], MD 02/07/2022 9:26:07 AM This report has been signed electronically. Number of Addenda: 0

## 2022-02-07 NOTE — Op Note (Signed)
[facility] Patient Name: [name] Procedure Date: 02/07/2022 MRN: [id number] Attending MD: [name] , MD Date of Birth: 1945/05/21 CSN: [id number] Age: 76 Admit Type: Outpatient Procedure:                Upper GI endoscopy Indications:              For botulinum toxin injection of achalasia Providers:                [name], MD, Dulcy Fanny, Janee Morn,                            Technician Referring MD:             Theone Stanley Medicines:                Monitored Anesthesia Care Complications:            No immediate complications. Estimated blood loss:                            Minimal. Estimated Blood Loss:     Estimated blood loss was minimal. Procedure:                Pre-Anesthesia Assessment:                           - Prior to the procedure, a History and Physical                            was performed, and patient medications and                            allergies were reviewed. The patient's tolerance of                            previous anesthesia was also reviewed. The risks                            and benefits of the procedure and the sedation                            options and risks were discussed with the patient.                            All questions were answered, and informed consent                            was obtained. Prior Anticoagulants: The patient has                            taken Eliquis (apixaban), last dose was 2 days                            prior to procedure. ASA Grade Assessment: III - A  patient with severe systemic disease. After                            reviewing the risks and benefits, the patient was                            deemed in satisfactory condition to undergo the                            procedure.                           After obtaining informed consent, the endoscope was                            passed under direct vision.  Throughout the                            procedure, the patient's blood pressure, pulse, and                            oxygen saturations were monitored continuously. The                            [id number]-[id number] ([id number]) Olympus endoscope was introduced                            through the mouth, and advanced to the second part                            of duodenum. The upper GI endoscopy was                            accomplished without difficulty. The patient                            tolerated the procedure well. Scope In: Scope Out: Findings:      The lumen of the upper third of the esophagus was moderately dilated.      Biopsies were obtained from the proximal and distal esophagus with cold       forceps for histology of suspected eosinophilic esophagitis.      An area, 1 cm above the GE junction at 40 cm, was successfully injected       with 100 units botulinum toxin, 25units/ml, 1 ml in each of the four       quadrants.      Localized severely erythematous mucosa without bleeding was found in the       gastric antrum. Biopsies were taken with a cold forceps for Helicobacter       pylori testing.      The cardia and gastric fundus were normal on retroflexion.      The examined duodenum was normal. Impression:               - Dilation in the upper third of the esophagus.  Biopsied. Injected with botulinum toxin.                           - Erythematous mucosa in the antrum. Biopsied.                           - Normal examined duodenum. Moderate Sedation:      Patient did not receive moderate sedation for this procedure, but       instead received monitored anesthesia care. Recommendation:           - Patient has a contact number available for                            emergencies. The signs and symptoms of potential                            delayed complications were discussed with the                            patient. Return to normal  activities tomorrow.                            Written discharge instructions were provided to the                            patient.                           - Resume regular diet.                           - Continue present medications.                           - Await pathology results.                           - Resume Eliquis (apixaban) at prior dose tomorrow.                           - Consider POEM for achalasia. Procedure Code(s):        --- Professional ---                           [phone], Esophagogastroduodenoscopy, flexible,                            transoral; with biopsy, single or multiple                           43236, 70, Esophagogastroduodenoscopy, flexible,                            transoral; with directed submucosal injection(s),                            any substance Diagnosis Code(s):        ---  Professional ---                           K22.8, Other specified diseases of esophagus                           K31.89, Other diseases of stomach and duodenum                           K22.0, Achalasia of cardia CPT copyright 2019 [employer]. All rights reserved. The codes documented in this report are preliminary and upon coder review may  be revised to meet current compliance requirements. [name], MD 02/07/2022 9:22:29 AM This report has been signed electronically. Number of Addenda: 0

## 2022-02-08 LAB — SURGICAL PATHOLOGY

## 2022-02-12 ENCOUNTER — Encounter (HOSPITAL_COMMUNITY): Payer: Self-pay | Admitting: Gastroenterology

## 2022-02-16 NOTE — Progress Notes (Signed)
Office Visit    Patient Name: [name] Date of Encounter: 02/16/2022  PCP:  [name], MD   [place]  Cardiologist:  Sinclair Grooms, MD  Advanced Practice Provider:  No care team member to display Electrophysiologist:  None   HPI    [name] is a 76 y.o. female with past medical history significant for PAF on amiodarone and Eliquis, CHA2DS2-VASc score of 3, diabetes mellitus, MVP, HLD, fibromyalgia, GERD, and OSA presents today for follow-up.  She was last seen by Dr. Tamala Julian 06/2021 and was doing well at that time.  She did not have any lower extremity edema, syncope, bleeding on anticoagulation, episodes of atrial fibrillation or other complaints.  She does have fibromyalgia which prevents her from shopping.  Her husband helps to all the physical work-up in house.  Today, she was a no show for her appointment.  Past Medical History    Past Medical History:  Diagnosis Date   ADHD (attention deficit hyperactivity disorder)    Anticoagulant long-term use    eliquis--- managed by cardiology   Chronic constipation    Chronic fatigue syndrome    followed by [name] in [place] (CFS clinic)   CKD (chronic kidney disease), stage III ([place])    followed by nephrologist--- [name])   Complication of anesthesia 1975   projectile vomitting   DDD (degenerative disc disease), cervical    Diabetic neuropathy ([place])    Dyslipidemia    Esophageal dysmotility 2023   barium swallow done on 09/07/21 that showed moderate esophageal dysmotility, mild to moderate persistent narrowing of the distal esophagus.   Esophageal stricture    followed by [facility] ([name]);   hx balloon dilatation 08/ 2014;  takes nitro as needed for esophageal pain   Fibromyalgia    Full dentures    Gait instability    GERD (gastroesophageal reflux disease)    History of colon polyps    Insulin dependent type 2 diabetes mellitus  ([place])    followed by pcp   (10-19-2021  pt stated checks blood sugar QID,  fasting average 70-140)   Mild obstructive sleep apnea 10/18/2012   sleep study on 10/05/21 revealed mild obstructive sleep apnea. Pt follows with Dr. Chesley Mires, Pulmonology, [place] 08/10/21 in Epic.   Osteoporosis    PAF (paroxysmal atrial fibrillation) [facility])    cardiologist--- [name];   hx DCCV 04/ 2013;   PONV (postoperative nausea and vomiting)    Rosacea    Secondary hyperparathyroidism of renal origin ([place])    Vitamin D deficiency 10/18/2012   Past Surgical History:  Procedure Laterality Date   BALLOON DILATION N/A 12/03/2012   Procedure: BALLOON DILATION;  Surgeon: [name], MD;  [facility]: [facility];  Service: Endoscopy;  Laterality: N/A;   BIOPSY  02/07/2022   Procedure: BIOPSY;  Surgeon: [name], MD;  Location: [facility];  Service: Gastroenterology;;   BOTOX INJECTION N/A 02/07/2022   Procedure: BOTOX INJECTION;  Surgeon: [name], MD;  [facility];  Service: Gastroenterology;  Laterality: N/A;   BREAST EXCISIONAL BIOPSY Left    1990s;   benign   CARDIAC CATHETERIZATION  1989   CARDIOVERSION  07/28/2011   Procedure: CARDIOVERSION;  Surgeon: Sinclair Grooms, MD;  Location: [place];  Service: Cardiovascular;  Laterality: N/A;   CATARACT EXTRACTION W/ INTRAOCULAR LENS IMPLANT Bilateral    left 10/ 2013;  right  12/ 2021   COLONOSCOPY WITH PROPOFOL N/A 03/29/2015   Procedure: COLONOSCOPY WITH PROPOFOL;  Surgeon: [name], MD;  [facility];  Service: Endoscopy;  Laterality: N/A;   COLONOSCOPY WITH PROPOFOL N/A 02/07/2022   Procedure: COLONOSCOPY WITH PROPOFOL;  Surgeon: [name], MD;  [facility];  Service: Gastroenterology;  Laterality: N/A;   ESOPHAGEAL MANOMETRY N/A 05/19/2013   Procedure: ESOPHAGEAL MANOMETRY (EM);  Surgeon: [name], MD;  [facility];  Service: Endoscopy;  Laterality: N/A;   ESOPHAGOGASTRODUODENOSCOPY  (EGD) WITH PROPOFOL N/A 12/03/2012   Procedure: ESOPHAGOGASTRODUODENOSCOPY (EGD) WITH Dilation & w/PROPOFOL;  Surgeon: [name], MD;  [facility];  Service: Endoscopy;  Laterality: N/A;   ESOPHAGOGASTRODUODENOSCOPY (EGD) WITH PROPOFOL N/A 02/07/2022   Procedure: ESOPHAGOGASTRODUODENOSCOPY (EGD) WITH PROPOFOL;  Surgeon: [name], MD;  [facility];  Service: Gastroenterology;  Laterality: N/A;   EXCISION MASS LOWER EXTREMETIES Left 10/21/2021   Procedure: EXCISION OF LEFT THIGH MASS;  Surgeon: [name], MD;  Location: [place];  Service: General;  Laterality: Left;   LAPAROSCOPIC CHOLECYSTECTOMY  07/2014   '@[facility]    POLYPECTOMY  02/07/2022   Procedure: POLYPECTOMY;  Surgeon: [name], MD;  [facility];  Service: Gastroenterology;;   RETINAL DETACHMENT SURGERY Left 11/2016   THYROIDECTOMY Right 1987   per pt benign   TOTAL ABDOMINAL HYSTERECTOMY W/ BILATERAL SALPINGOOPHORECTOMY  1995   TUBAL LIGATION  1983    Allergies  Allergies  Allergen Reactions   Iohexol Other (See Comments)     Desc: EYES & LIPS SWELLING, SOB DURING A MYELOGRAM '88, DECADRON GIVEN/ PRE MEDS REQUIRED/A.C., Onset Date: [id number]    Morphine And Related Other (See Comments)    Hyperactivity / "climbs the wall's"   Penicillins Hives and Swelling    Has patient had a PCN reaction causing immediate rash, facial/tongue/throat swelling, SOB or lightheadedness with hypotension: Yes Has patient had a PCN reaction causing severe rash involving mucus membranes or skin necrosis: No Has patient had a PCN reaction that required hospitalization No Has patient had a PCN reaction occurring within the last 10 years: No If all of the above answers are "NO", then may proceed with Cephalosporin use.    Shellfish Allergy Hives and Swelling    Hard shellfish   Demerol Nausea And Vomiting    Can take with Phenergan   Avandia [Rosiglitazone] Swelling    Other  reaction(s): Weight gain and edema   Latex Rash    Contact dermatitis   Pradaxa [Dabigatran Etexilate Mesylate] Other (See Comments)    Headaches    Victoza [Liraglutide] Other (See Comments)    headaches    EKGs/Labs/Other Studies Reviewed:   The following studies were reviewed today:  2D Doppler echocardiogram 2017: Study Conclusions   - Left ventricle: The cavity size was normal. Wall thickness was    normal. Systolic function was normal. The estimated ejection    fraction was in the range of 60% to 65%. Wall motion was normal;    there were no regional wall motion abnormalities. Left    ventricular diastolic function parameters were normal.  - Left atrium: The atrium was mildly dilated.  - Atrial septum: No defect or patent foramen ovale was identified.  - Impressions: Normal GLS -17.9.   Impressions:   - Normal GLS -17.9.     EKG:  EKG is  ordered today.  The ekg ordered today demonstrates   Recent Labs: 10/21/2021: BUN 28; Creatinine, Ser 1.40;  Hemoglobin 15.0; Potassium 4.8; Sodium 141  Recent Lipid Panel No results found for: "CHOL", "TRIG", "HDL", "CHOLHDL", "VLDL", "LDLCALC", "LDLDIRECT"   Home Medications   No outpatient medications have been marked as taking for the 02/17/22 encounter (Appointment) with Elgie Collard, PA-C.     Review of Systems      All other systems reviewed and are otherwise negative except as noted above.  Physical Exam    VS:  There were no vitals taken for this visit. , BMI There is no height or weight on file to calculate BMI.  Wt Readings from Last 3 Encounters:  02/07/22 205 lb 14.6 oz (93.4 kg)  11/29/21 205 lb 14.6 oz (93.4 kg)  10/26/21 206 lb (93.4 kg)     GEN: Well nourished, well developed, in no acute distress. HEENT: normal. Neck: Supple, no JVD, carotid bruits, or masses. Cardiac: RRR, no murmurs, rubs, or gallops. No clubbing, cyanosis, edema.  Radials/PT 2+ and equal bilaterally.  Respiratory:  Respirations  regular and unlabored, clear to auscultation bilaterally. GI: Soft, nontender, nondistended. MS: No deformity or atrophy. Skin: Warm and dry, no rash. Neuro:  Strength and sensation are intact. Psych: Normal affect.  Assessment & Plan    Paroxysmal atrial fibrillation Essential hypertension OSA Anticoagulation goal INR 2-3     Disposition: Follow up  with Sinclair Grooms, MD or APP.  Signed, Elgie Collard, PA-C 02/16/2022, 5:44 PM [facility]

## 2022-02-17 ENCOUNTER — Ambulatory Visit: Payer: Medicare Other | Attending: Physician Assistant | Admitting: Physician Assistant

## 2022-02-17 DIAGNOSIS — I480 Paroxysmal atrial fibrillation: Secondary | ICD-10-CM

## 2022-02-17 DIAGNOSIS — Z7901 Long term (current) use of anticoagulants: Secondary | ICD-10-CM

## 2022-02-17 DIAGNOSIS — G4733 Obstructive sleep apnea (adult) (pediatric): Secondary | ICD-10-CM

## 2022-02-17 DIAGNOSIS — I10 Essential (primary) hypertension: Secondary | ICD-10-CM

## 2022-02-21 DIAGNOSIS — R0781 Pleurodynia: Secondary | ICD-10-CM | POA: Diagnosis not present

## 2022-02-21 DIAGNOSIS — M25561 Pain in right knee: Secondary | ICD-10-CM | POA: Diagnosis not present

## 2022-02-21 DIAGNOSIS — M170 Bilateral primary osteoarthritis of knee: Secondary | ICD-10-CM | POA: Diagnosis not present

## 2022-02-21 DIAGNOSIS — M25562 Pain in left knee: Secondary | ICD-10-CM | POA: Diagnosis not present

## 2022-03-06 DIAGNOSIS — H04123 Dry eye syndrome of bilateral lacrimal glands: Secondary | ICD-10-CM | POA: Diagnosis not present

## 2022-03-23 ENCOUNTER — Ambulatory Visit: Payer: Medicare Other | Admitting: Podiatry

## 2022-03-27 ENCOUNTER — Ambulatory Visit (HOSPITAL_BASED_OUTPATIENT_CLINIC_OR_DEPARTMENT_OTHER): Payer: Medicare Other | Admitting: Pulmonary Disease

## 2022-03-28 DIAGNOSIS — M1711 Unilateral primary osteoarthritis, right knee: Secondary | ICD-10-CM | POA: Diagnosis not present

## 2022-03-28 DIAGNOSIS — M1712 Unilateral primary osteoarthritis, left knee: Secondary | ICD-10-CM | POA: Diagnosis not present

## 2022-03-28 DIAGNOSIS — M170 Bilateral primary osteoarthritis of knee: Secondary | ICD-10-CM | POA: Diagnosis not present

## 2022-03-28 NOTE — Progress Notes (Deleted)
Cardiology Office Note:    Date:  03/28/2022   ID:  Allison, Nichols 23-Feb-1946, MRN [id number]  PCP:  Allison Frames, MD   [place] Providers Cardiologist:  Allison Grooms, MD {  Referring MD: [name], *    History of Present Illness:    [name] is a 76 y.o. female with a hx of PAF, DM, MVP, HLD, fibromyalgia, GERD and OSA who presents to clinic for follow-up. Previously followed by Dr. Tamala Nichols.  Was last seen in clinic by Dr. Tamala Nichols on 06/2021 where she was doing well from a CV standpoint.  Today, ***    Past Medical History:  Diagnosis Date   ADHD (attention deficit hyperactivity disorder)    Anticoagulant long-term use    eliquis--- managed by cardiology   Chronic constipation    Chronic fatigue syndrome    followed by dr [name] in [place] (CFS clinic)   CKD (chronic kidney disease), stage III ([name])    followed by nephrologist--- dr [name]  [name])   Complication of anesthesia 1975   projectile vomitting   DDD (degenerative disc disease), cervical    Diabetic neuropathy ([place])    Dyslipidemia    Esophageal dysmotility 2023   barium swallow done on 09/07/21 that showed moderate esophageal dysmotility, mild to moderate persistent narrowing of the distal esophagus.   Esophageal stricture    followed by [facility] (dr [name]);   hx balloon dilatation 08/ 2014;  takes nitro as needed for esophageal pain   Fibromyalgia    Full dentures    Gait instability    GERD (gastroesophageal reflux disease)    History of colon polyps    Insulin dependent type 2 diabetes mellitus ([place])    followed by pcp   (10-19-2021  pt stated checks blood sugar QID,  fasting average 70-140)   Mild obstructive sleep apnea 10/18/2012   sleep study on 10/05/21 revealed mild obstructive sleep apnea. Pt follows with Dr. Chesley Nichols, Pulmonology, [place] 08/10/21 in Epic.   Osteoporosis    PAF (paroxysmal atrial fibrillation) [facility])     cardiologist--- dr h. [name];   hx DCCV 04/ 2013;   PONV (postoperative nausea and vomiting)    Rosacea    Secondary hyperparathyroidism of renal origin ([place])    Vitamin D deficiency 10/18/2012    Past Surgical History:  Procedure Laterality Date   BALLOON DILATION N/A 12/03/2012   Procedure: BALLOON DILATION;  Surgeon: Allison Fair, MD;  [facility];  Service: Endoscopy;  Laterality: N/A;   BIOPSY  02/07/2022   Procedure: BIOPSY;  Surgeon: Allison Juniper, MD;  [facility];  Service: Gastroenterology;;   BOTOX INJECTION N/A 02/07/2022   Procedure: BOTOX INJECTION;  Surgeon: Allison Juniper, MD;  [facility];  Service: Gastroenterology;  Laterality: N/A;   BREAST EXCISIONAL BIOPSY Left    1990s;   benign   CARDIAC CATHETERIZATION  1989   CARDIOVERSION  07/28/2011   Procedure: CARDIOVERSION;  Surgeon: Allison Grooms, MD;  Location: [place];  Service: Cardiovascular;  Laterality: N/A;   CATARACT EXTRACTION W/ INTRAOCULAR LENS IMPLANT Bilateral    left 10/ 2013;  right 12/ 2021   COLONOSCOPY WITH PROPOFOL N/A 03/29/2015   Procedure: COLONOSCOPY WITH PROPOFOL;  Surgeon: Allison Fair, MD;  [facility];  Service: Endoscopy;  Laterality: N/A;   COLONOSCOPY WITH PROPOFOL N/A 02/07/2022   Procedure: COLONOSCOPY WITH PROPOFOL;  Surgeon: Allison Juniper, MD;  [facility];  Service: Gastroenterology;  Laterality: N/A;   ESOPHAGEAL MANOMETRY N/A 05/19/2013   Procedure: ESOPHAGEAL MANOMETRY (EM);  Surgeon: Allison Fair, MD;  [facility];  Service: Endoscopy;  Laterality: N/A;   ESOPHAGOGASTRODUODENOSCOPY (EGD) WITH PROPOFOL N/A 12/03/2012   Procedure: ESOPHAGOGASTRODUODENOSCOPY (EGD) WITH Dilation & w/PROPOFOL;  Surgeon: Allison Fair, MD;  [facility];  Service: Endoscopy;  Laterality: N/A;   ESOPHAGOGASTRODUODENOSCOPY (EGD) WITH PROPOFOL N/A 02/07/2022   Procedure: ESOPHAGOGASTRODUODENOSCOPY (EGD) WITH PROPOFOL;   Surgeon: Allison Juniper, MD;  [facility];  Service: Gastroenterology;  Laterality: N/A;   EXCISION MASS LOWER EXTREMETIES Left 10/21/2021   Procedure: EXCISION OF LEFT THIGH MASS;  Surgeon: Allison Riley, MD;  Location: [place];  Service: General;  Laterality: Left;   LAPAROSCOPIC CHOLECYSTECTOMY  07/2014   '@[facility]    POLYPECTOMY  02/07/2022   Procedure: POLYPECTOMY;  Surgeon: Allison Juniper, MD;  [facility];  Service: Gastroenterology;;   RETINAL DETACHMENT SURGERY Left 11/2016   THYROIDECTOMY Right 1987   per pt benign   TOTAL ABDOMINAL HYSTERECTOMY W/ BILATERAL SALPINGOOPHORECTOMY  1995   TUBAL LIGATION  1983    Current Medications: No outpatient medications have been marked as taking for the 03/30/22 encounter (Appointment) with Allison Bergeron, MD.     Allergies:   Iohexol, Morphine and related, Penicillins, Shellfish allergy, Demerol, Avandia [rosiglitazone], Latex, Pradaxa [dabigatran etexilate mesylate], and Victoza [liraglutide]   Social History   Socioeconomic History   Marital status: Married    Spouse name: Not on file   Number of children: Not on file   Years of education: Not on file   Highest education level: Not on file  Occupational History   Not on file  Tobacco Use   Smoking status: Former    Years: 2.00    Types: Cigarettes    Quit date: 1972    Years since quitting: 51.9   Smokeless tobacco: Never  Vaping Use   Vaping Use: Never used  Substance and Sexual Activity   Alcohol use: No   Drug use: Never   Sexual activity: Yes  Other Topics Concern   Not on file  Social History Narrative   Not on file   Social Determinants of Health   Financial Resource Strain: Not on file  Food Insecurity: Not on file  Transportation Needs: Not on file  Physical Activity: Not on file  Stress: Not on file  Social Connections: Not on file     Family History: The patient's ***family history includes Atrial fibrillation  in her father; Breast cancer in her maternal aunt; Bronchitis in her father; Diabetes type II in her father; Heart disease in her father; Hypertension in her mother; Hyperthyroidism in her mother; Kidney cancer in her sister.  ROS:   Please see the history of present illness.    *** All other systems reviewed and are negative.  EKGs/Labs/Other Studies Reviewed:    The following studies were reviewed today: 2D Doppler echocardiogram 2017: Study Conclusions   - Left ventricle: The cavity size was normal. Wall thickness was    normal. Systolic function was normal. The estimated ejection    fraction was in the range of 60% to 65%. Wall motion was normal;    there were no regional wall motion abnormalities. Left    ventricular diastolic function parameters were normal.  - Left atrium: The atrium was mildly dilated.  - Atrial septum: No defect or patent foramen ovale was identified.  -  Impressions: Normal GLS -17.9.   Impressions:   - Normal GLS -17.9.    Myoview 01/2016: Nuclear stress EF: 64%. There was no ST segment deviation noted during stress. There is a small defect of mild severity present in the apex location. The defect is non-reversible. This is most consistent with diaphragmatic attenuation artifact. No ischemia noted. This is a low risk study. The left ventricular ejection fraction is normal (55-65%).  EKG:  EKG is *** ordered today.  The ekg ordered today demonstrates ***  Recent Labs: 10/21/2021: BUN 28; Creatinine, Ser 1.40; Hemoglobin 15.0; Potassium 4.8; Sodium 141  Recent Lipid Panel No results found for: "CHOL", "TRIG", "HDL", "CHOLHDL", "VLDL", "LDLCALC", "LDLDIRECT"   Risk Assessment/Calculations:   {Does this patient have ATRIAL FIBRILLATION?:[phone]}  No BP recorded.  {Refresh Note OR Click here to enter BP  :1}***         Physical Exam:    VS:  There were no vitals taken for this visit.    Wt Readings from Last 3 Encounters:  02/07/22 205 lb  14.6 oz (93.4 kg)  11/29/21 205 lb 14.6 oz (93.4 kg)  10/26/21 206 lb (93.4 kg)     GEN: *** Well nourished, well developed in no acute distress HEENT: Normal NECK: No JVD; No carotid bruits LYMPHATICS: No lymphadenopathy CARDIAC: ***RRR, no murmurs, rubs, gallops RESPIRATORY:  Clear to auscultation without rales, wheezing or rhonchi  ABDOMEN: Soft, non-tender, non-distended MUSCULOSKELETAL:  No edema; No deformity  SKIN: Warm and dry NEUROLOGIC:  Alert and oriented x 3 PSYCHIATRIC:  Normal affect   ASSESSMENT:    No diagnosis found. PLAN:    In order of problems listed above:  #Paroxysmal Afib: Doing well and maintaining NSR.  -Continue amiodarone '100mg'$  daily -Continue apixaban '5mg'$  BID -Continue metop '25mg'$  BID  #HTN: -Continue metop '25mg'$  BID  #HLD: -Continue lipitor '80mg'$  daily  #DMII: -Continue insulin -Managed by PCP  #OSA: -Continue CPAP      {Are you ordering a CV Procedure (e.g. stress test, cath, DCCV, TEE, etc)?   Press F2        :[id number]}    Medication Adjustments/Labs and Tests Ordered: Current medicines are reviewed at length with the patient today.  Concerns regarding medicines are outlined above.  No orders of the defined types were placed in this encounter.  No orders of the defined types were placed in this encounter.   There are no Patient Instructions on file for this visit.   Signed, Allison Bergeron, MD  03/28/2022 8:49 PM    [place]

## 2022-03-30 ENCOUNTER — Ambulatory Visit: Payer: Medicare Other | Admitting: Cardiology

## 2022-04-06 ENCOUNTER — Other Ambulatory Visit: Payer: Self-pay

## 2022-04-06 ENCOUNTER — Ambulatory Visit: Payer: Medicare Other | Admitting: Podiatry

## 2022-04-06 DIAGNOSIS — M170 Bilateral primary osteoarthritis of knee: Secondary | ICD-10-CM | POA: Diagnosis not present

## 2022-04-06 DIAGNOSIS — M1711 Unilateral primary osteoarthritis, right knee: Secondary | ICD-10-CM | POA: Diagnosis not present

## 2022-04-06 DIAGNOSIS — M1712 Unilateral primary osteoarthritis, left knee: Secondary | ICD-10-CM | POA: Diagnosis not present

## 2022-04-06 MED ORDER — METOPROLOL TARTRATE 25 MG PO TABS: 25.0000 mg | ORAL_TABLET | Freq: Two times a day (BID) | ORAL | 1 refills | Status: DC

## 2022-05-04 ENCOUNTER — Ambulatory Visit: Payer: Medicare Other | Admitting: Podiatry

## 2022-05-18 ENCOUNTER — Encounter: Payer: Self-pay | Admitting: Podiatry

## 2022-05-18 ENCOUNTER — Other Ambulatory Visit: Payer: Self-pay | Admitting: Internal Medicine

## 2022-05-18 ENCOUNTER — Ambulatory Visit (INDEPENDENT_AMBULATORY_CARE_PROVIDER_SITE_OTHER): Payer: Medicare Other | Admitting: Podiatry

## 2022-05-18 VITALS — BP 130/60

## 2022-05-18 DIAGNOSIS — E119 Type 2 diabetes mellitus without complications: Secondary | ICD-10-CM | POA: Diagnosis not present

## 2022-05-18 DIAGNOSIS — M79674 Pain in right toe(s): Secondary | ICD-10-CM | POA: Diagnosis not present

## 2022-05-18 DIAGNOSIS — M2042 Other hammer toe(s) (acquired), left foot: Secondary | ICD-10-CM

## 2022-05-18 DIAGNOSIS — Z794 Long term (current) use of insulin: Secondary | ICD-10-CM | POA: Diagnosis not present

## 2022-05-18 DIAGNOSIS — M79675 Pain in left toe(s): Secondary | ICD-10-CM

## 2022-05-18 DIAGNOSIS — M2041 Other hammer toe(s) (acquired), right foot: Secondary | ICD-10-CM

## 2022-05-18 DIAGNOSIS — M2012 Hallux valgus (acquired), left foot: Secondary | ICD-10-CM | POA: Diagnosis not present

## 2022-05-18 DIAGNOSIS — M810 Age-related osteoporosis without current pathological fracture: Secondary | ICD-10-CM

## 2022-05-18 DIAGNOSIS — L84 Corns and callosities: Secondary | ICD-10-CM

## 2022-05-18 DIAGNOSIS — B351 Tinea unguium: Secondary | ICD-10-CM | POA: Diagnosis not present

## 2022-05-18 NOTE — Progress Notes (Signed)
ANNUAL DIABETIC FOOT EXAM  Subjective: [name] presents today for diabetic foot evaluation.  Chief Complaint  Patient presents with   Nail Problem    DFC , pt thinks she has fungus on 2nd toe of left foot BG - 98 this morning  A1C - 7.0 PCP - Dr Felipa Eth , last OV 12/2021 , retired . Appt w/ new pcp Dr Koleen Nimrod April 2024   Patient confirms h/o diabetes.  Patient denies any h/o foot wounds.  Patient denies any numbness, tingling, burning, or pins/needle sensation in feet.  Risk factors: diabetes, diabetic neuropathy, HTN, CKD, hyperlipidemia, h/o tobacco use in remission.  [name], MD is patient's PCP.   Past Medical History:  Diagnosis Date   ADHD (attention deficit hyperactivity disorder)    Anticoagulant long-term use    eliquis--- managed by cardiology   Chronic constipation    Chronic fatigue syndrome    followed by [name] in [place] (CFS clinic)   CKD (chronic kidney disease), stage III ([place])    followed by nephrologist--- [name])   Complication of anesthesia 1975   projectile vomitting   DDD (degenerative disc disease), cervical    Diabetic neuropathy ([place])    Dyslipidemia    Esophageal dysmotility 2023   barium swallow done on 09/07/21 that showed moderate esophageal dysmotility, mild to moderate persistent narrowing of the distal esophagus.   Esophageal stricture    followed by [facility] ([name]);   hx balloon dilatation 08/ 2014;  takes nitro as needed for esophageal pain   Fibromyalgia    Full dentures    Gait instability    GERD (gastroesophageal reflux disease)    History of colon polyps    Insulin dependent type 2 diabetes mellitus ([place])    followed by pcp   (10-19-2021  pt stated checks blood sugar QID,  fasting average 70-140)   Mild obstructive sleep apnea 10/18/2012   sleep study on 10/05/21 revealed mild obstructive sleep apnea. Pt follows with Dr. Chesley Mires, Pulmonology, [place] 08/10/21 in  [place].   Osteoporosis    PAF (paroxysmal atrial fibrillation) [facility])    cardiologist--- [name];   hx DCCV 04/ 2013;   PONV (postoperative nausea and vomiting)    Rosacea    Secondary hyperparathyroidism of renal origin ([place])    Vitamin D deficiency 10/18/2012   Patient Active Problem List   Diagnosis Date Noted   Diabetes mellitus ([place])    Chronic constipation 05/23/2021   Arthritis of carpometacarpal (CMC) joint of left thumb 05/28/2017   Muscle jerks during sleep 05/06/2015   Achalasia 01/27/2014   On amiodarone therapy 05/07/2013    Class: Chronic   Anticoagulation goal of INR 2 to 3 05/07/2013    Class: Chronic   Atrial fibrillation ([place]) 05/07/2013   Combined hyperlipidemia 07/10/2007   Obstructive sleep apnea 07/10/2007   Essential hypertension 07/10/2007   Mitral valve disorder 07/10/2007   CHRONIC FATIGUE SYNDROME 07/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   Past Surgical History:  Procedure Laterality Date   BALLOON DILATION N/A 12/03/2012   Procedure: BALLOON DILATION;  Surgeon: [name], MD;  Location: [facility];  Service: Endoscopy;  Laterality: N/A;   BIOPSY  02/07/2022   Procedure: BIOPSY;  Surgeon: [name], MD;  Location: [facility];  Service: Gastroenterology;;   BOTOX INJECTION N/A 02/07/2022   Procedure: BOTOX INJECTION;  Surgeon: [name], MD;  Location: [facility];  Service: Gastroenterology;  Laterality: N/A;   BREAST EXCISIONAL BIOPSY  Left    1990s;   benign   CARDIAC CATHETERIZATION  1989   CARDIOVERSION  07/28/2011   Procedure: CARDIOVERSION;  Surgeon: Sinclair Grooms, MD;  Location: [facility] OR;  Service: Cardiovascular;  Laterality: N/A;   CATARACT EXTRACTION W/ INTRAOCULAR LENS IMPLANT Bilateral    left 10/ 2013;  right 12/ 2021   COLONOSCOPY WITH PROPOFOL N/A 03/29/2015   Procedure: COLONOSCOPY WITH PROPOFOL;  Surgeon: [name], MD;  Location: [facility];  Service: Endoscopy;  Laterality: N/A;   COLONOSCOPY WITH PROPOFOL N/A 02/07/2022    Procedure: COLONOSCOPY WITH PROPOFOL;  Surgeon: [name], MD;  Location: [facility];  Service: Gastroenterology;  Laterality: N/A;   ESOPHAGEAL MANOMETRY N/A 05/19/2013   Procedure: ESOPHAGEAL MANOMETRY (EM);  Surgeon: [name], MD;  Location: [facility];  Service: Endoscopy;  Laterality: N/A;   ESOPHAGOGASTRODUODENOSCOPY (EGD) WITH PROPOFOL N/A 12/03/2012   Procedure: ESOPHAGOGASTRODUODENOSCOPY (EGD) WITH Dilation & w/PROPOFOL;  Surgeon: [name], MD;  Location: [facility];  Service: Endoscopy;  Laterality: N/A;   ESOPHAGOGASTRODUODENOSCOPY (EGD) WITH PROPOFOL N/A 02/07/2022   Procedure: ESOPHAGOGASTRODUODENOSCOPY (EGD) WITH PROPOFOL;  Surgeon: [name], MD;  Location: [facility];  Service: Gastroenterology;  Laterality: N/A;   EXCISION MASS LOWER EXTREMETIES Left 10/21/2021   Procedure: EXCISION OF LEFT THIGH MASS;  Surgeon: [name], MD;  [facility];  Service: General;  Laterality: Left;   LAPAROSCOPIC CHOLECYSTECTOMY  07/2014   '@[facility]'$    POLYPECTOMY  02/07/2022   Procedure: POLYPECTOMY;  Surgeon: [name], MD;  Location: [facility];  Service: Gastroenterology;;   RETINAL DETACHMENT SURGERY Left 11/2016   THYROIDECTOMY Right 1987   per pt benign   TOTAL ABDOMINAL HYSTERECTOMY W/ BILATERAL SALPINGOOPHORECTOMY  [place]   Current Outpatient Medications on File Prior to Visit  Medication Sig Dispense Refill   acetaminophen (TYLENOL) 500 MG tablet Take 500 mg by mouth every 6 (six) hours as needed for mild pain or fever.      amiodarone (PACERONE) 200 MG tablet Take 0.5 tablets (100 mg total) by mouth daily. 45 tablet 3   amphetamine-dextroamphetamine (ADDERALL) 20 MG tablet Take 20 mg by mouth 3 (three) times daily as needed (ADD).     atorvastatin (LIPITOR) 80 MG tablet Take 80 mg by mouth at bedtime.     azelastine (ASTELIN) 0.1 % nasal spray Place 1 spray into both nostrils daily. Use in each nostril as  directed (Patient taking differently: Place 1 spray into both nostrils daily as needed. Use in each nostril as directed) 90 mL 3   BD PEN NEEDLE NANO U/F 32G X 4 MM MISC      buPROPion (WELLBUTRIN XL) 300 MG 24 hr tablet Take 300 mg by mouth every morning.     Cholecalciferol (VITAMIN D) 125 MCG (5000 UT) CAPS Take 5,000 Units by mouth daily.      ELIQUIS 5 MG TABS tablet TAKE 1 TABLET TWICE A DAY (Patient taking differently: Take 5 mg by mouth 2 (two) times daily.) 180 tablet 2   esomeprazole (NEXIUM) 40 MG capsule TAKE 1 CAPSULE TWICE DAILY BEFORE MEALS (Patient taking differently: Take 40 mg by mouth daily.) 180 capsule 3   FARXIGA 10 MG TABS tablet Take 10 mg by mouth daily.     fluticasone (FLONASE) 50 MCG/ACT nasal spray Place 1 spray into both nostrils daily. (Patient taking differently: Place 1 spray into both nostrils daily as needed.) 48 g 3   FREESTYLE  LITE test strip 4 (four) times daily.     furosemide (LASIX) 20 MG tablet Take 0.5 tablets (10 mg total) by mouth daily as needed for fluid. 45 tablet 2   GAVILYTE-G 236 g solution See admin instructions.     Insulin Glargine (BASAGLAR KWIKPEN) 100 UNIT/ML SOPN Inject 40 Units into the skin daily.     insulin lispro (HUMALOG) 100 UNIT/ML injection Inject 10-25 Units into the skin 3 (three) times daily. Inject 10-25 units into the skin 3 times daily  per SS     ketoconazole (NIZORAL) 2 % cream Apply 1 Application topically daily as needed for irritation.     Melatonin 10 MG TBDP Take 10 mg by mouth at bedtime as needed (sleep).     methocarbamol (ROBAXIN) 500 MG tablet      metoprolol tartrate (LOPRESSOR) 25 MG tablet Take 1 tablet (25 mg total) by mouth 2 (two) times daily. 180 tablet 1   metroNIDAZOLE (METROCREAM) 0.75 % cream Apply 1 Application topically daily as needed (rosacea).     MOVANTIK 12.5 MG TABS tablet Take 12.5 mg by mouth daily as needed (opioid induced constipation).  5   nitroGLYCERIN (NITROSTAT) 0.4 MG SL tablet Place  0.4 mg under the tongue every 5 (five) minutes as needed for chest pain.     ondansetron (ZOFRAN-ODT) 4 MG disintegrating tablet DISSOLVE ONE TABLET ON TONGUE EVERY 8 HOURS AS NEEDED FOR NAUSEA     oxyCODONE (OXY IR/ROXICODONE) 5 MG immediate release tablet Take 5 mg by mouth every 6 (six) hours as needed (for breakthrough pain).     OXYCONTIN 30 MG 12 hr tablet Take 15 mg by mouth 2 (two) times daily.     polyvinyl alcohol (ARTIFICIAL TEARS) 1.4 % ophthalmic solution Place 1 drop into both eyes as needed for dry eyes.     pregabalin (LYRICA) 150 MG capsule Take 150 mg by mouth 2 (two) times daily.     sertraline (ZOLOFT) 100 MG tablet Take 200 mg by mouth daily.     tiZANidine (ZANAFLEX) 2 MG tablet Take 2 mg by mouth 3 (three) times daily as needed for muscle spasms.     [DISCONTINUED] estrogens, conjugated, (PREMARIN) 0.9 MG tablet Take 0.9 mg by mouth daily.     [DISCONTINUED] glimepiride (AMARYL) 4 MG tablet Take 4 mg by mouth 2 (two) times daily.      [DISCONTINUED] metFORMIN (GLUCOPHAGE) 500 MG tablet Take 1,000 mg by mouth 2 (two) times daily with a meal.       No current facility-administered medications on file prior to visit.    Allergies  Allergen Reactions   Iohexol Other (See Comments)     Desc: EYES & LIPS SWELLING, SOB DURING A MYELOGRAM '88, DECADRON GIVEN/ PRE MEDS REQUIRED/A.C., Onset Date: [id number]    Morphine And Related Other (See Comments)    Hyperactivity / "climbs the wall's"   Penicillins Hives and Swelling    Has patient had a PCN reaction causing immediate rash, facial/tongue/throat swelling, SOB or lightheadedness with hypotension: Yes Has patient had a PCN reaction causing severe rash involving mucus membranes or skin necrosis: No Has patient had a PCN reaction that required hospitalization No Has patient had a PCN reaction occurring within the last 10 years: No If all of the above answers are "NO", then may proceed with Cephalosporin use.    Shellfish  Allergy Hives and Swelling    Hard shellfish   Demerol Nausea And Vomiting    Can take with  Phenergan   Avandia [Rosiglitazone] Swelling    Other reaction(s): Weight gain and edema   Latex Rash    Contact dermatitis   Pradaxa [Dabigatran Etexilate Mesylate] Other (See Comments)    Headaches    Victoza [Liraglutide] Other (See Comments)    headaches   Social History   Occupational History   Not on file  Tobacco Use   Smoking status: Former    Years: 2.00    Types: Cigarettes    Quit date: 1972    Years since quitting: 52.1   Smokeless tobacco: Never  Vaping Use   Vaping Use: Never used  Substance and Sexual Activity   Alcohol use: No   Drug use: Never   Sexual activity: Yes   Family History  Problem Relation Age of Onset   Hypertension Mother    Hyperthyroidism Mother    Diabetes type II Father    Heart disease Father    Atrial fibrillation Father    Bronchitis Father    Kidney cancer Sister    Breast cancer Maternal Aunt    Immunization History  Administered Date(s) Administered   Influenza Split 02/25/2009, 01/18/2010, 02/20/2012, 02/26/2013, 03/08/2015   Influenza-Unspecified 02/14/2017, 02/07/2018   Moderna Sars-Covid-2 Vaccination 06/02/2019, 06/30/2019, 03/05/2020, 10/22/2020   Pneumococcal Conjugate-13 10/27/2013   Pneumococcal Polysaccharide-23 10/02/2006, 10/03/2012   Tdap 06/05/2012, 11/27/2019   Zoster, Live 01/09/2011, 07/02/2020, 10/29/2020     Review of Systems: Negative except as noted in the HPI.   Objective: Vitals:   05/18/22 1631  BP: 130/60    Lelia K Horlacher is a pleasant 77 y.o. female in NAD. AAO X 3.  Vascular Examination: Capillary refill time <3 seconds b/l. Vascular status intact b/l with palpable pedal pulses. Pedal hair sparse b/l. No edema. No pain with calf compression b/l. Skin temperature gradient WNL b/l. Mild varicosities b/l LE. No ischemia or gangrene noted b/l LE. No cyanosis or clubbing noted b/l  LE.  Neurological Examination: Protective sensation intact 5/5 intact bilaterally with 10g monofilament b/l. Vibratory sensation intact b/l. Proprioception intact bilaterally.  Dermatological Examination: Pedal skin with normal turgor, texture and tone b/l.  No open wounds. No interdigital macerations.   Toenails 1-5 b/l thick, discolored, elongated with subungual debris and pain on dorsal palpation.   Hyperkeratotic lesion(s) bilateral great toes and submet head 1 b/l.  No erythema, no edema, no drainage, no fluctuance.  Musculoskeletal Examination: Normal muscle strength 5/5 to all lower extremity muscle groups bilaterally. HAV with bunion deformity noted b/l LE. Hammertoe deformity noted 2-5 b/l.[name] No pain, crepitus or joint limitation noted with ROM b/l LE.  Patient ambulates independently without assistive aids.  Radiographs: None  Footwear Assessment: Does the patient wear appropriate shoes? Yes. Does the patient need inserts/orthotics? No.  ADA Risk Categorization: Low Risk :  Patient has all of the following: Intact protective sensation No prior foot ulcer  No severe deformity Pedal pulses present  Assessment: 1. Pain due to onychomycosis of toenails of both feet   2. Type 2 diabetes mellitus without complication, with long-term current use of insulin (HCC)   3. Callus   4. Hav (hallux abducto valgus), left   5. Acquired hammertoes of both feet   6. Encounter for diabetic foot exam ([place])      Plan: No orders of the defined types were placed in this encounter.  -Patient was evaluated and treated. All patient's and/or POA's questions/concerns answered on today's visit. -Diabetic foot examination performed today. -Stressed the importance of good glycemic  control and the detriment of not  controlling glucose levels in relation to the foot. -Discussed diabetic foot care principles. Literature dispensed on today. -Patient to continue soft, supportive shoe gear  daily. -Discussed treatment options for onychomycosis. Patient opted for topical OTC therapy. Patient is to apply Vick's Vapor rub with cotton tipped applicator to affected toenail(s) once daily. -Toenails 1-5 b/l were debrided in length and girth with sterile nail nippers and dremel without iatrogenic bleeding.  -Callus(es) bilateral great toes and submet head 1 b/l pared utilizing sterile scalpel blade without complication or incident. Total number debrided =2. -Patient/POA to call should there be question/concern in the interim. Return in about 3 months (around 08/17/2022).  Marzetta Board, DPM

## 2022-05-18 NOTE — Patient Instructions (Signed)
Vaseline Intensive Care Lotion for dry skin. Apply to both feet once daily and apply Vaseline Petroleum Jelly to feet afterwards.  Apply Vick's Vapor Rub to toenails with cotton swab once daily.  Diabetes Mellitus and Foot Care Diabetes, also called diabetes mellitus, may cause problems with your feet and legs because of poor blood flow (circulation). Poor circulation may make your skin: Become thinner and drier. Break more easily. Heal more slowly. Peel and crack. You may also have nerve damage (neuropathy). This can cause decreased feeling in your legs and feet. This means that you may not notice minor injuries to your feet that could lead to more serious problems. Finding and treating problems early is the best way to prevent future foot problems. How to care for your feet Foot hygiene  Wash your feet daily with warm water and mild soap. Do not use hot water. Then, pat your feet and the areas between your toes until they are fully dry. Do not soak your feet. This can dry your skin. Trim your toenails straight across. Do not dig under them or around the cuticle. File the edges of your nails with an emery board or nail file. Apply a moisturizing lotion or petroleum jelly to the skin on your feet and to dry, brittle toenails. Use lotion that does not contain alcohol and is unscented. Do not apply lotion between your toes. Shoes and socks Wear clean socks or stockings every day. Make sure they are not too tight. Do not wear knee-high stockings. These may decrease blood flow to your legs. Wear shoes that fit well and have enough cushioning. Always look in your shoes before you put them on to be sure there are no objects inside. To break in new shoes, wear them for just a few hours a day. This prevents injuries on your feet. Wounds, scrapes, corns, and calluses  Check your feet daily for blisters, cuts, bruises, sores, and redness. If you cannot see the bottom of your feet, use a mirror or ask  someone for help. Do not cut off corns or calluses or try to remove them with medicine. If you find a minor scrape, cut, or break in the skin on your feet, keep it and the skin around it clean and dry. You may clean these areas with mild soap and water. Do not clean the area with peroxide, alcohol, or iodine. If you have a wound, scrape, corn, or callus on your foot, look at it several times a day to make sure it is healing and not infected. Check for: Redness, swelling, or pain. Fluid or blood. Warmth. Pus or a bad smell. General tips Do not cross your legs. This may decrease blood flow to your feet. Do not use heating pads or hot water bottles on your feet. They may burn your skin. If you have lost feeling in your feet or legs, you may not know this is happening until it is too late. Protect your feet from hot and cold by wearing shoes, such as at the beach or on hot pavement. Schedule a complete foot exam at least once a year or more often if you have foot problems. Report any cuts, sores, or bruises to your health care provider right away. Where to find more information American Diabetes Association: diabetes.org Association of Diabetes Care & Education Specialists: diabeteseducator.org Contact a health care provider if: You have a condition that increases your risk of infection, and you have any cuts, sores, or bruises on your feet.  You have an injury that is not healing. You have redness on your legs or feet. You feel burning or tingling in your legs or feet. You have pain or cramps in your legs and feet. Your legs or feet are numb. Your feet always feel cold. You have pain around any toenails. Get help right away if: You have a wound, scrape, corn, or callus on your foot and: You have signs of infection. You have a fever. You have a red line going up your leg. This information is not intended to replace advice given to you by your health care provider. Make sure you discuss any  questions you have with your health care provider. Document Revised: 10/12/2021 Document Reviewed: 10/12/2021 Elsevier Patient Education  [place].

## 2022-07-14 ENCOUNTER — Other Ambulatory Visit: Payer: Self-pay

## 2022-07-14 DIAGNOSIS — I480 Paroxysmal atrial fibrillation: Secondary | ICD-10-CM

## 2022-07-14 MED ORDER — AMIODARONE HCL 200 MG PO TABS: 100.0000 mg | ORAL_TABLET | Freq: Every day | ORAL | 0 refills | Status: DC

## 2022-07-14 MED ORDER — METOPROLOL TARTRATE 25 MG PO TABS: 25.0000 mg | ORAL_TABLET | Freq: Two times a day (BID) | ORAL | 0 refills | Status: DC

## 2022-07-14 NOTE — Addendum Note (Signed)
Addended by: Carter Kitten D on: 07/14/2022 12:24 PM   Modules accepted: Orders

## 2022-07-31 ENCOUNTER — Ambulatory Visit: Payer: Medicare Other | Admitting: Physician Assistant

## 2022-08-03 ENCOUNTER — Ambulatory Visit (INDEPENDENT_AMBULATORY_CARE_PROVIDER_SITE_OTHER): Payer: Medicare Other | Admitting: Podiatry

## 2022-08-03 DIAGNOSIS — Z91199 Patient's noncompliance with other medical treatment and regimen due to unspecified reason: Secondary | ICD-10-CM

## 2022-08-03 NOTE — Progress Notes (Signed)
1. No-show for appointment     

## 2022-08-09 ENCOUNTER — Other Ambulatory Visit: Payer: Self-pay | Admitting: Internal Medicine

## 2022-08-09 DIAGNOSIS — Z1231 Encounter for screening mammogram for malignant neoplasm of breast: Secondary | ICD-10-CM

## 2022-09-11 ENCOUNTER — Other Ambulatory Visit: Payer: Self-pay | Admitting: *Deleted

## 2022-09-11 MED ORDER — METOPROLOL TARTRATE 25 MG PO TABS: 25.0000 mg | ORAL_TABLET | Freq: Two times a day (BID) | ORAL | 0 refills | Status: DC

## 2022-09-13 ENCOUNTER — Ambulatory Visit
Admission: RE | Admit: 2022-09-13 | Discharge: 2022-09-13 | Disposition: A | Discharge status: Home / Self Care | Payer: Medicare Other | Source: Ambulatory Visit | Attending: Internal Medicine | Admitting: Internal Medicine

## 2022-09-13 DIAGNOSIS — M810 Age-related osteoporosis without current pathological fracture: Secondary | ICD-10-CM

## 2022-09-15 ENCOUNTER — Ambulatory Visit
Admission: RE | Admit: 2022-09-15 | Discharge: 2022-09-15 | Disposition: A | Discharge status: Home / Self Care | Payer: Medicare Other | Source: Ambulatory Visit | Attending: Internal Medicine | Admitting: Internal Medicine

## 2022-09-15 DIAGNOSIS — Z1231 Encounter for screening mammogram for malignant neoplasm of breast: Secondary | ICD-10-CM

## 2022-10-09 NOTE — Progress Notes (Signed)
  Cardiology Office Note:  .   Date:  10/09/2022  ID:  [name], DOB [birth year], MRN [id number] PCP: Emilio Aspen, MD  [facility] Providers Cardiologist:  Lesleigh Noe, MD (Inactive) { Click to update primary MD,subspecialty MD or APP then REFRESH:1}   History of Present Illness: .   [name] is a 77 y.o. female with a history of PAF, DM, MPV, HLD, fibromyalgia who presents today for annual follow-up appointment.  She was last seen in office March 2023 where she was doing well.  She did not have any lower extremity edema, syncope, bleeding on anticoagulation, episodes of A-fib or other complaints at that time.  She does not do her own shopping due to fibromyalgia.  Husband does all of physical work around the house.  She is becoming much less able to participate in regular activities at that time.  Does independently ambulate.  Today, she***  ROS: ***  Studies Reviewed: [name]    EKG:  ***  *** Risk Assessment/Calculations:   {Does this patient have ATRIAL FIBRILLATION?:[phone]} No BP recorded.  {Refresh Note OR Click here to enter BP  :1}***       Physical Exam:   VS:  There were no vitals taken for this visit.   Wt Readings from Last 3 Encounters:  02/07/22 205 lb 14.6 oz (93.4 kg)  11/29/21 205 lb 14.6 oz (93.4 kg)  10/26/21 206 lb (93.4 kg)    GEN: Well nourished, well developed in no acute distress NECK: No JVD; No carotid bruits CARDIAC: ***RRR, no murmurs, rubs, gallops RESPIRATORY:  Clear to auscultation without rales, wheezing or rhonchi  ABDOMEN: Soft, non-tender, non-distended EXTREMITIES:  No edema; No deformity   ASSESSMENT AND PLAN: .   1.  Paroxysmal atrial fibrillation 2.  Essential hypertension 3.  Obstructive sleep apnea 4.  On amiodarone therapy 5.  Anticoagulation goal of INR 2-3    {Are you ordering a CV Procedure (e.g. stress test, cath, DCCV, TEE, etc)?   Press F2        :[id number]}  Dispo: ***  Signed, Sharlene Dory, PA-C

## 2022-10-10 ENCOUNTER — Ambulatory Visit
Admission: RE | Admit: 2022-10-10 | Discharge: 2022-10-10 | Disposition: A | Discharge status: Home / Self Care | Payer: Medicare Other | Source: Ambulatory Visit | Attending: Physician Assistant | Admitting: Physician Assistant

## 2022-10-10 ENCOUNTER — Ambulatory Visit: Payer: Medicare Other | Attending: Physician Assistant | Admitting: Physician Assistant

## 2022-10-10 ENCOUNTER — Encounter: Payer: Self-pay | Admitting: Physician Assistant

## 2022-10-10 VITALS — BP 122/66 | HR 63 | Ht 64.5 in | Wt 197.0 lb

## 2022-10-10 DIAGNOSIS — Z79899 Other long term (current) drug therapy: Secondary | ICD-10-CM

## 2022-10-10 DIAGNOSIS — I480 Paroxysmal atrial fibrillation: Secondary | ICD-10-CM

## 2022-10-10 DIAGNOSIS — D6869 Other thrombophilia: Secondary | ICD-10-CM | POA: Diagnosis not present

## 2022-10-10 DIAGNOSIS — I10 Essential (primary) hypertension: Secondary | ICD-10-CM

## 2022-10-10 DIAGNOSIS — Z7901 Long term (current) use of anticoagulants: Secondary | ICD-10-CM

## 2022-10-10 DIAGNOSIS — Z5181 Encounter for therapeutic drug level monitoring: Secondary | ICD-10-CM

## 2022-10-10 DIAGNOSIS — I4811 Longstanding persistent atrial fibrillation: Secondary | ICD-10-CM

## 2022-10-10 DIAGNOSIS — G4733 Obstructive sleep apnea (adult) (pediatric): Secondary | ICD-10-CM

## 2022-10-10 NOTE — Patient Instructions (Signed)
Medication Instructions:  Your physician recommends that you continue on your current medications as directed. Please refer to the Current Medication list given to you today.  *If you need a refill on your cardiac medications before your next appointment, please call your pharmacy*   Lab Work: TSH-TODAY If you have labs (blood work) drawn today and your tests are completely normal, you will receive your results only by: MyChart Message (if you have MyChart) OR A paper copy in the mail If you have any lab test that is abnormal or we need to change your treatment, we will call you to review the results.   Testing/Procedures: Chest X-ray Instructions:    1. You may have this done at the [facility], located in the [facility] on the 1st floor.    2. You do no have to have an appointment.    3. [address] [place], [place] [address]        [phone]        Monday - Friday  8:00 am - 5:00 pm  Follow-Up: At [facility], you and your health needs are our priority.  As part of our continuing mission to provide you with exceptional heart care, we have created designated Provider Care Teams.  These Care Teams include your primary Cardiologist (physician) and Advanced Practice Providers (APPs -  Physician Assistants and Nurse Practitioners) who all work together to provide you with the care you need, when you need it.  Your next appointment:   6 month(s)  Provider:   Dr Lynnette Caffey  Low-Sodium Eating Plan Salt (sodium) helps you keep a healthy balance of fluids in your body. Too much sodium can raise your blood pressure. It can also cause fluid and waste to be held in your body. Your health care provider or dietitian may recommend a low-sodium eating plan if you have high blood pressure (hypertension), kidney disease, liver disease, or heart failure. Eating less sodium can help lower your blood pressure and reduce swelling. It can also protect  your heart, liver, and kidneys. What are tips for following this plan? Reading food labels  Check food labels for the amount of sodium per serving. If you eat more than one serving, you must multiply the listed amount by the number of servings. Choose foods with less than 140 milligrams (mg) of sodium per serving. Avoid foods with 300 mg of sodium or more per serving. Always check how much sodium is in a product, even if the label says "unsalted" or "no salt added." Shopping  Buy products labeled as "low-sodium" or "no salt added." Buy fresh foods. Avoid canned foods and pre-made or frozen meals. Avoid canned, cured, or processed meats. Buy breads that have less than 80 mg of sodium per slice. Cooking  Eat more home-cooked food. Try to eat less restaurant, buffet, and fast food. Try not to add salt when you cook. Use salt-free seasonings or herbs instead of table salt or sea salt. Check with your provider or pharmacist before using salt substitutes. Cook with plant-based oils, such as canola, sunflower, or olive oil. Meal planning When eating at a restaurant, ask if your food can be made with less salt or no salt. Avoid dishes labeled as brined, pickled, cured, or smoked. Avoid dishes made with soy sauce, miso, or teriyaki sauce. Avoid foods that have monosodium glutamate (MSG) in them. MSG may be added to some restaurant food, sauces, soups,  bouillon, and canned foods. Make meals that can be grilled, baked, poached, roasted, or steamed. These are often made with less sodium. General information Try to limit your sodium intake to 1,500-2,300 mg each day, or the amount told by your provider. What foods should I eat? Fruits Fresh, frozen, or canned fruit. Fruit juice. Vegetables Fresh or frozen vegetables. "No salt added" canned vegetables. "No salt added" tomato sauce and paste. Low-sodium or reduced-sodium tomato and vegetable juice. Grains Low-sodium cereals, such as oats, puffed  wheat and rice, and shredded wheat. Low-sodium crackers. Unsalted rice. Unsalted pasta. Low-sodium bread. Whole grain breads and whole grain pasta. Meats and other proteins Fresh or frozen meat, poultry, seafood, and fish. These should have no added salt. Low-sodium canned tuna and salmon. Unsalted nuts. Dried peas, beans, and lentils without added salt. Unsalted canned beans. Eggs. Unsalted nut butters. Dairy Milk. Soy milk. Cheese that is naturally low in sodium, such as ricotta cheese, fresh mozzarella, or Swiss cheese. Low-sodium or reduced-sodium cheese. Cream cheese. Yogurt. Seasonings and condiments Fresh and dried herbs and spices. Salt-free seasonings. Low-sodium mustard and ketchup. Sodium-free salad dressing. Sodium-free light mayonnaise. Fresh or refrigerated horseradish. Lemon juice. Vinegar. Other foods Homemade, reduced-sodium, or low-sodium soups. Unsalted popcorn and pretzels. Low-salt or salt-free chips. The items listed above may not be all the foods and drinks you can have. Talk to a dietitian to learn more. What foods should I avoid? Vegetables Sauerkraut, pickled vegetables, and relishes. Olives. [place] fries. Onion rings. Regular canned vegetables, except low-sodium or reduced-sodium items. Regular canned tomato sauce and paste. Regular tomato and vegetable juice. Frozen vegetables in sauces. Grains Instant hot cereals. Bread stuffing, pancake, and biscuit mixes. Croutons. Seasoned rice or pasta mixes. Noodle soup cups. Boxed or frozen macaroni and cheese. Regular salted crackers. Self-rising flour. Meats and other proteins Meat or fish that is salted, canned, smoked, spiced, or pickled. Precooked or cured meat, such as sausages or meat loaves. Tomasa Blase. Ham. Pepperoni. Hot dogs. Corned beef. Chipped beef. Salt pork. Jerky. Pickled herring, anchovies, and sardines. Regular canned tuna. Salted nuts. Dairy Processed cheese and cheese spreads. Hard cheeses. Cheese curds. Blue  cheese. Feta cheese. String cheese. Regular cottage cheese. Buttermilk. Canned milk. Fats and oils Salted butter. Regular margarine. Ghee. Bacon fat. Seasonings and condiments Onion salt, garlic salt, seasoned salt, table salt, and sea salt. Canned and packaged gravies. Worcestershire sauce. Tartar sauce. Barbecue sauce. Teriyaki sauce. Soy sauce, including reduced-sodium soy sauce. Steak sauce. Fish sauce. Oyster sauce. Cocktail sauce. Horseradish that you find on the shelf. Regular ketchup and mustard. Meat flavorings and tenderizers. Bouillon cubes. Hot sauce. Pre-made or packaged marinades. Pre-made or packaged taco seasonings. Relishes. Regular salad dressings. Salsa. Other foods Salted popcorn and pretzels. Corn chips and puffs. Potato and tortilla chips. Canned or dried soups. Pizza. Frozen entrees and pot pies. The items listed above may not be all the foods and drinks you should avoid. Talk to a dietitian to learn more. This information is not intended to replace advice given to you by your health care provider. Make sure you discuss any questions you have with your health care provider. Document Revised: 04/27/2022 Document Reviewed: 04/27/2022 Elsevier Patient Education  2024 [employer]  Heart-Healthy Eating Plan Many factors influence your heart health, including eating and exercise habits. Heart health is also called coronary health. Coronary risk increases with abnormal blood fat (lipid) levels. A heart-healthy eating plan includes limiting unhealthy fats, increasing healthy fats, limiting salt (sodium) intake, and making other diet  and lifestyle changes. What is my plan? Your health care provider may recommend that: You limit your fat intake to _________% or less of your total calories each day. You limit your saturated fat intake to _________% or less of your total calories each day. You limit the amount of cholesterol in your diet to less than _________ mg per day. You limit  the amount of sodium in your diet to less than _________ mg per day. What are tips for following this plan? Cooking Cook foods using methods other than frying. Baking, boiling, grilling, and broiling are all good options. Other ways to reduce fat include: Removing the skin from poultry. Removing all visible fats from meats. Steaming vegetables in water or broth. Meal planning  At meals, imagine dividing your plate into fourths: Fill one-half of your plate with vegetables and green salads. Fill one-fourth of your plate with whole grains. Fill one-fourth of your plate with lean protein foods. Eat 2-4 cups of vegetables per day. One cup of vegetables equals 1 cup (91 g) broccoli or cauliflower florets, 2 medium carrots, 1 large bell pepper, 1 large sweet potato, 1 large tomato, 1 medium white potato, 2 cups (150 g) raw leafy greens. Eat 1-2 cups of fruit per day. One cup of fruit equals 1 small apple, 1 large banana, 1 cup (237 g) mixed fruit, 1 large orange,  cup (82 g) dried fruit, 1 cup (240 mL) 100% fruit juice. Eat more foods that contain soluble fiber. Examples include apples, broccoli, carrots, beans, peas, and barley. Aim to get 25-30 g of fiber per day. Increase your consumption of legumes, nuts, and seeds to 4-5 servings per week. One serving of dried beans or legumes equals  cup (90 g) cooked, 1 serving of nuts is  oz (12 almonds, 24 pistachios, or 7 walnut halves), and 1 serving of seeds equals  oz (8 g). Fats Choose healthy fats more often. Choose monounsaturated and polyunsaturated fats, such as olive and canola oils, avocado oil, flaxseeds, walnuts, almonds, and seeds. Eat more omega-3 fats. Choose salmon, mackerel, sardines, tuna, flaxseed oil, and ground flaxseeds. Aim to eat fish at least 2 times each week. Check food labels carefully to identify foods with trans fats or high amounts of saturated fat. Limit saturated fats. These are found in animal products, such as meats,  butter, and cream. Plant sources of saturated fats include palm oil, palm kernel oil, and coconut oil. Avoid foods with partially hydrogenated oils in them. These contain trans fats. Examples are stick margarine, some tub margarines, cookies, crackers, and other baked goods. Avoid fried foods. General information Eat more home-cooked food and less restaurant, buffet, and fast food. Limit or avoid alcohol. Limit foods that are high in added sugar and simple starches such as foods made using white refined flour (white breads, pastries, sweets). Lose weight if you are overweight. Losing just 5-10% of your body weight can help your overall health and prevent diseases such as diabetes and heart disease. Monitor your sodium intake, especially if you have high blood pressure. Talk with your health care provider about your sodium intake. Try to incorporate more vegetarian meals weekly. What foods should I eat? Fruits All fresh, canned (in natural juice), or frozen fruits. Vegetables Fresh or frozen vegetables (raw, steamed, roasted, or grilled). Green salads. Grains Most grains. Choose whole wheat and whole grains most of the time. Rice and pasta, including brown rice and pastas made with whole wheat. Meats and other proteins Lean, well-trimmed beef,  veal, pork, and lamb. Chicken and [place] without skin. All fish and shellfish. Wild duck, rabbit, pheasant, and venison. Egg whites or low-cholesterol egg substitutes. Dried beans, peas, lentils, and tofu. Seeds and most nuts. Dairy Low-fat or nonfat cheeses, including ricotta and mozzarella. Skim or 1% milk (liquid, powdered, or evaporated). Buttermilk made with low-fat milk. Nonfat or low-fat yogurt. Fats and oils Non-hydrogenated (trans-free) margarines. Vegetable oils, including soybean, sesame, sunflower, olive, avocado, peanut, safflower, corn, canola, and cottonseed. Salad dressings or mayonnaise made with a vegetable oil. Beverages Water (mineral  or sparkling). Coffee and tea. Unsweetened ice tea. Diet beverages. Sweets and desserts Sherbet, gelatin, and fruit ice. Small amounts of dark chocolate. Limit all sweets and desserts. Seasonings and condiments All seasonings and condiments. The items listed above may not be a complete list of foods and beverages you can eat. Contact a dietitian for more options. What foods should I avoid? Fruits Canned fruit in heavy syrup. Fruit in cream or butter sauce. Fried fruit. Limit coconut. Vegetables Vegetables cooked in cheese, cream, or butter sauce. Fried vegetables. Grains Breads made with saturated or trans fats, oils, or whole milk. Croissants. Sweet rolls. Donuts. High-fat crackers, such as cheese crackers and chips. Meats and other proteins Fatty meats, such as hot dogs, ribs, sausage, bacon, rib-eye roast or steak. High-fat deli meats, such as salami and bologna. Caviar. Domestic duck and goose. Organ meats, such as liver. Dairy Cream, sour cream, cream cheese, and creamed cottage cheese. Whole-milk cheeses. Whole or 2% milk (liquid, evaporated, or condensed). Whole buttermilk. Cream sauce or high-fat cheese sauce. Whole-milk yogurt. Fats and oils Meat fat, or shortening. Cocoa butter, hydrogenated oils, palm oil, coconut oil, palm kernel oil. Solid fats and shortenings, including bacon fat, salt pork, lard, and butter. Nondairy cream substitutes. Salad dressings with cheese or sour cream. Beverages Regular sodas and any drinks with added sugar. Sweets and desserts Frosting. Pudding. Cookies. Cakes. Pies. Milk chocolate or white chocolate. Buttered syrups. Full-fat ice cream or ice cream drinks. The items listed above may not be a complete list of foods and beverages to avoid. Contact a dietitian for more information. Summary Heart-healthy meal planning includes limiting unhealthy fats, increasing healthy fats, limiting salt (sodium) intake and making other diet and lifestyle  changes. Lose weight if you are overweight. Losing just 5-10% of your body weight can help your overall health and prevent diseases such as diabetes and heart disease. Focus on eating a balance of foods, including fruits and vegetables, low-fat or nonfat dairy, lean protein, nuts and legumes, whole grains, and heart-healthy oils and fats. This information is not intended to replace advice given to you by your health care provider. Make sure you discuss any questions you have with your health care provider. Document Revised: 05/16/2021 Document Reviewed: 05/16/2021 Elsevier Patient Education  2024 [employer].

## 2022-10-20 ENCOUNTER — Inpatient Hospital Stay: Admission: RE | Admit: 2022-10-20 | Payer: Medicare Other | Source: Ambulatory Visit

## 2022-10-24 ENCOUNTER — Other Ambulatory Visit: Payer: Medicare Other

## 2022-11-09 ENCOUNTER — Ambulatory Visit (INDEPENDENT_AMBULATORY_CARE_PROVIDER_SITE_OTHER): Payer: Medicare Other | Admitting: Podiatry

## 2022-11-09 DIAGNOSIS — E1151 Type 2 diabetes mellitus with diabetic peripheral angiopathy without gangrene: Secondary | ICD-10-CM | POA: Diagnosis not present

## 2022-11-09 DIAGNOSIS — L84 Corns and callosities: Secondary | ICD-10-CM | POA: Diagnosis not present

## 2022-11-09 DIAGNOSIS — B351 Tinea unguium: Secondary | ICD-10-CM

## 2022-11-09 NOTE — Progress Notes (Unsigned)
Subjective:  Patient ID: [name], female    DOB: 06/09/1945,  MRN: [id number]  [name] presents to clinic today for:  Chief Complaint  Patient presents with   Nail Problem    DFC  . Patient notes nails are thick and elongated, causing pain in shoe gear when ambulating.  She also notes painful calluses bilateral submet 1 and bilateral hallux plantar medial aspect of the IPJ.  PCP is Emilio Aspen, MD. date last seen was 10/22/2022.  Allergies  Allergen Reactions   Iohexol Other (See Comments)     Desc: EYES & LIPS SWELLING, SOB DURING A MYELOGRAM '88, DECADRON GIVEN/ PRE MEDS REQUIRED/A.C., Onset Date: [id number]    Morphine And Codeine Other (See Comments)    Hyperactivity / "climbs the wall's"   Penicillins Hives and Swelling    Has patient had a PCN reaction causing immediate rash, facial/tongue/throat swelling, SOB or lightheadedness with hypotension: Yes Has patient had a PCN reaction causing severe rash involving mucus membranes or skin necrosis: No Has patient had a PCN reaction that required hospitalization No Has patient had a PCN reaction occurring within the last 10 years: No If all of the above answers are "NO", then may proceed with Cephalosporin use.    Shellfish Allergy Hives and Swelling    Hard shellfish   Demerol Nausea And Vomiting    Can take with Phenergan   Avandia [Rosiglitazone] Swelling    Other reaction(s): Weight gain and edema   Latex Rash    Contact dermatitis   Pradaxa [Dabigatran Etexilate Mesylate] Other (See Comments)    Headaches    Victoza [Liraglutide] Other (See Comments)    headaches    Review of Systems: Negative except as noted in the HPI.  Objective:  There were no vitals filed for this visit.  [name] is a pleasant 77 y.o. female in NAD. AAO x 3.  Vascular Examination: Patient has palpable DP pulse, absent PT pulse bilateral.  Delayed capillary refill bilateral toes.  Sparse digital hair  bilateral.  Proximal to distal cooling WNL bilateral.    Dermatological Examination: Interspaces are clear with no open lesions noted bilateral.  Nails are 3-66mm thick, with yellowish/brown discoloration, subungual debris and distal onycholysis x10.  There is pain with compression of nails x10.  There are hyperkeratotic lesions noted bilateral submet 1 and plantar medial aspect of the hallux IPJ no ulceration is noted.  Patient qualifies for at-risk foot care because of diabetes with PVD.  Assessment/Plan: 1. Dermatophytosis of nail   2. Type II diabetes mellitus with peripheral circulatory disorder (HCC)   3. Callus of foot    Mycotic nails x10 were sharply debrided with sterile nail nippers and power debriding burr to decrease bulk and length.  Hyperkeratotic lesions were shaved with #312 blade.  Recommended Tolcylen solution daily for the toenail fungus, and recommended Revitaderm 40 cream for the calluses to be used nightly for maintenance  Return in about 3 months (around 02/09/2023) for [facility].   Clerance Lav, DPM, FACFAS [facility]     [address], [place] [address]                Office [phone]  Fax (  [phone] 

## 2022-11-10 ENCOUNTER — Ambulatory Visit: Payer: Medicare Other | Admitting: Podiatry

## 2022-12-18 ENCOUNTER — Telehealth: Payer: Self-pay | Admitting: Internal Medicine

## 2022-12-18 ENCOUNTER — Other Ambulatory Visit: Payer: Self-pay

## 2022-12-18 DIAGNOSIS — I480 Paroxysmal atrial fibrillation: Secondary | ICD-10-CM

## 2022-12-18 MED ORDER — ELIQUIS 5 MG PO TABS: 5.0000 mg | ORAL_TABLET | Freq: Two times a day (BID) | ORAL | 2 refills | Status: DC

## 2022-12-18 MED ORDER — AMIODARONE HCL 200 MG PO TABS: 100.0000 mg | ORAL_TABLET | Freq: Every day | ORAL | 3 refills | Status: DC

## 2022-12-18 MED ORDER — ELIQUIS 5 MG PO TABS: 5.0000 mg | ORAL_TABLET | Freq: Two times a day (BID) | ORAL | 1 refills | Status: DC

## 2022-12-18 MED ORDER — METOPROLOL TARTRATE 25 MG PO TABS: 25.0000 mg | ORAL_TABLET | Freq: Two times a day (BID) | ORAL | 3 refills | Status: DC

## 2022-12-18 MED ORDER — ELIQUIS 5 MG PO TABS: 5.0000 mg | ORAL_TABLET | Freq: Two times a day (BID) | ORAL | 0 refills | Status: DC

## 2022-12-18 NOTE — Telephone Encounter (Signed)
Prescription refill request for Eliquis received. Indication:afib Last office visit:6/24 Scr:1.40  5/24 Age: 77 Weight:89.4  kg  Prescription refilled

## 2022-12-18 NOTE — Telephone Encounter (Signed)
Patient is calling in for refills of Lopressor, Eliquis, and Amiodarone to be sent to [facility]. She is requesting 90 day supply.   She states that she will run out of Eliquis before it will be delivered. She would like a 30 day supply [facility] while she waits for mail order.   Prescriptions for Lopressor and amiodarone have been sent in.  Will route to anticoag for Eliquis.

## 2022-12-18 NOTE — Telephone Encounter (Signed)
Call disconnected before I could get more information.

## 2022-12-18 NOTE — Telephone Encounter (Signed)
*  STAT* If patient is at the pharmacy, call can be transferred to refill team.   1. Which medications need to be refilled? (please list name of each medication and dose if known)   metoprolol tartrate (LOPRESSOR) 25 MG tablet (still has some medication) ELIQUIS 5 MG TABS tablet (completely out) amiodarone (PACERONE) 200 MG tablet  (still has some medication)  2. Would you like to learn more about the convenience, safety, & potential cost savings by using the [facility]?   3. Are you open to using the [facility] (Type [facility]. ).  4. Which pharmacy/location (including street and city if local pharmacy) is medication to be sent to?  [facility] - [place], [place] - [address]   5. Do they need a 30 day or 90 day supply?   90 day   Patient wants a 30 day supply of the Eli[id number] [place],  - [address] as she will be completely out of this medication.

## 2022-12-18 NOTE — Telephone Encounter (Signed)
Pt states she needs to speak with a nurse about her medications. Please advise.

## 2023-02-21 ENCOUNTER — Ambulatory Visit: Payer: Medicare Other | Admitting: Podiatry

## 2023-03-19 ENCOUNTER — Other Ambulatory Visit: Payer: Self-pay | Admitting: Internal Medicine

## 2023-03-19 DIAGNOSIS — Z13820 Encounter for screening for osteoporosis: Secondary | ICD-10-CM

## 2023-03-20 ENCOUNTER — Encounter: Payer: Self-pay | Admitting: Internal Medicine

## 2023-03-20 DIAGNOSIS — Z0000 Encounter for general adult medical examination without abnormal findings: Secondary | ICD-10-CM

## 2023-03-21 ENCOUNTER — Ambulatory Visit
Admission: RE | Admit: 2023-03-21 | Discharge: 2023-03-21 | Disposition: A | Discharge status: Home / Self Care | Payer: Medicare Other | Source: Ambulatory Visit | Attending: Internal Medicine | Admitting: Internal Medicine

## 2023-03-21 DIAGNOSIS — Z13820 Encounter for screening for osteoporosis: Secondary | ICD-10-CM

## 2023-03-28 NOTE — Progress Notes (Unsigned)
Cardiology Office Note:   Date:  03/28/2023  ID:  [name], [name] [birth year], MRN [id number] PCP:  Emilio Aspen, MD  [facility] Providers Cardiologist:  Alverda Skeans, MD Referring MD: Emilio Aspen, *  Chief Complaint/Reason for Referral:  Cardiology follow up ASSESSMENT:    1. Paroxysmal atrial fibrillation (HCC)   2. Secondary hypercoagulable state (HCC)   3. Type 2 diabetes mellitus with complication, with long-term current use of insulin (HCC)   4. Hypertension associated with diabetes (HCC)   5. Hyperlipidemia associated with type 2 diabetes mellitus (HCC)   6. CKD (chronic kidney disease) stage 4, GFR 15-29 ml/min (HCC)   7. BMI 34.0-34.9,adult     PLAN:   In order of problems listed above: Paroxysmal atrial fibrillation: Continue Eliquis.  I reviewed multiple EKGs since 2013 which demonstrated normal sinus rhythm.  While amiodarone has controlled her I think we should trial a beta-blocker instead of amiodarone.  Will obtain an echocardiogram to evaluate LV function.  If normal will consider Lopressor or Toprol and discontinue amiodarone.  Will check reflex TSH, CMP next week for amiodarone monitoring.  Check PFTs. Secondary hypercoagulable state: Continue Eliquis. Type 2 diabetes mellitus: Continue Eliquis, Farxiga, and atorvastatin.  Given chronic kidney disease the patient would benefit from an ARB.  Will start losartan 12.5 mg daily and check CMP next week. Hypertension: See #3 above. Hyperlipidemia: Will check lipid panel and LP(a) next week along with CMP. CKD stage IV: Continue Farxiga and start losartan for renal protection. Elevated BMI: Will refer to pharmacy for recommendations regarding GLP-1 receptor agonist therapy.        {Are you ordering a CV Procedure (e.g. stress test, cath, DCCV, TEE, etc)?   Press F2        :[id number]}   Dispo:  No follow-ups on file.      Medication Adjustments/Labs and Tests Ordered: Current medicines are  reviewed at length with the patient today.  Concerns regarding medicines are outlined above.  The following changes have been made:  {PLAN; NO CHANGE:13088:s}   Labs/tests ordered: No orders of the defined types were placed in this encounter.   Medication Changes: No orders of the defined types were placed in this encounter.   Current medicines are reviewed at length with the patient today.  The patient {ACTIONS; HAS/DOES NOT HAVE:19233} concerns regarding medicines.  I spent *** minutes reviewing all clinical data during and prior to this visit including all relevant imaging studies, laboratories, clinical information from other health systems and prior notes from both Cardiology and other specialties, interviewing the patient, conducting a complete physical examination, and coordinating care in order to formulate a comprehensive and personalized evaluation and treatment plan.  History of Present Illness:      FOCUSED PROBLEM LIST:   Paroxysmal atrial fibrillation CV 2 score of 5 On chronic amiodarone and Eliquis Cardioversion 2013 Type 2 diabetes mellitus on insulin Hyperlipidemia Hypertension CKD stage IV, GFR 29 BMI 34 Chest pain Lexiscan low risk 2017 Fibromyalgia Esophageal spasm None as needed nitroglycerin Obstructive sleep apnea Mild, home sleep study 2023  December 2024: The patient is here for routine follow-up.  She was doing well at that visit.  Her blood pressure was well-controlled.  A TSH and chest x-ray were ordered for monitoring of amiodarone.  Chest x-ray was without issues and the TSH was not performed.          Current Medications: No outpatient medications have been marked as taking for the  03/30/23 encounter (Appointment) with Orbie Pyo, MD.     Review of Systems:   Please see the history of present illness.    All other systems reviewed and are negative.     EKGs/Labs/Other Test Reviewed:   EKG: EKG in June 2024 demonstrates sinus  rhythm with septal infarction pattern; I reviewed multiple EKGs since 2013 that demonstrated normal sinus rhythm.  EKGs in 2013 demonstrated atrial fibrillation atrial flutter.  EKG Interpretation Date/Time:    Ventricular Rate:    PR Interval:    QRS Duration:    QT Interval:    QTC Calculation:   R Axis:      Text Interpretation:           Risk Assessment/Calculations:   {Does this patient have ATRIAL FIBRILLATION?:[id number]}      Physical Exam:   VS:  There were no vitals taken for this visit.   No BP recorded.  {Refresh Note OR Click here to enter BP  :1}***   Wt Readings from Last 3 Encounters:  10/10/22 197 lb (89.4 kg)  02/07/22 205 lb 14.6 oz (93.4 kg)  11/29/21 205 lb 14.6 oz (93.4 kg)      GENERAL:  No apparent distress, AOx3 HEENT:  No carotid bruits, +2 carotid impulses, no scleral icterus CAR: RRR Irregular RR*** no murmurs***, gallops, rubs, or thrills RES:  Clear to auscultation bilaterally ABD:  Soft, nontender, nondistended, positive bowel sounds x 4 VASC:  +2 radial pulses, +2 carotid pulses NEURO:  CN 2-12 grossly intact; motor and sensory grossly intact PSYCH:  No active depression or anxiety EXT:  No edema, ecchymosis, or cyanosis  Signed, Orbie Pyo, MD  03/28/2023 11:19 AM    [facility] [address] [place], [place], [place]  [address] Phone: [phone]; Fax: [phone]   Note:  This document was prepared using Dragon voice recognition software and may include unintentional dictation errors.

## 2023-03-30 ENCOUNTER — Ambulatory Visit: Payer: Medicare Other | Admitting: Internal Medicine

## 2023-03-30 DIAGNOSIS — E785 Hyperlipidemia, unspecified: Secondary | ICD-10-CM

## 2023-03-30 DIAGNOSIS — E118 Type 2 diabetes mellitus with unspecified complications: Secondary | ICD-10-CM

## 2023-03-30 DIAGNOSIS — E1159 Type 2 diabetes mellitus with other circulatory complications: Secondary | ICD-10-CM

## 2023-03-30 DIAGNOSIS — N184 Chronic kidney disease, stage 4 (severe): Secondary | ICD-10-CM

## 2023-03-30 DIAGNOSIS — D6869 Other thrombophilia: Secondary | ICD-10-CM

## 2023-03-30 DIAGNOSIS — Z6834 Body mass index (BMI) 34.0-34.9, adult: Secondary | ICD-10-CM

## 2023-03-30 DIAGNOSIS — I480 Paroxysmal atrial fibrillation: Secondary | ICD-10-CM

## 2023-04-03 NOTE — Progress Notes (Unsigned)
Cardiology Office Note:   Date:  04/03/2023  ID:  Dimond, Rascon [birth year], MRN [id number] PCP:  Emilio Aspen, MD  [facility] Providers Cardiologist:  Alverda Skeans, MD Referring MD: Emilio Aspen, *  Chief Complaint/Reason for Referral:  Cardiology follow up ASSESSMENT:    1. Paroxysmal atrial fibrillation (HCC)   2. Secondary hypercoagulable state (HCC)   3. Type 2 diabetes mellitus with complication, with long-term current use of insulin (HCC)   4. Hypertension associated with diabetes (HCC)   5. Hyperlipidemia associated with type 2 diabetes mellitus (HCC)   6. CKD (chronic kidney disease) stage 4, GFR 15-29 ml/min (HCC)   7. BMI 34.0-34.9,adult      PLAN:   In order of problems listed above: Paroxysmal atrial fibrillation: Continue Eliquis.  I reviewed multiple EKGs since 2013 which demonstrated normal sinus rhythm.  While amiodarone has controlled her I think we should trial a beta-blocker instead of amiodarone.  Will obtain an echocardiogram to evaluate LV function.  If normal will consider Lopressor or Toprol and discontinue amiodarone.  Will check reflex TSH, CMP next week for amiodarone monitoring.  Check PFTs.*** Secondary hypercoagulable state: Continue Eliquis. Type 2 diabetes mellitus: Continue Eliquis, Farxiga, and atorvastatin.  Given chronic kidney disease the patient would benefit from an ARB.  Will start losartan 12.5 mg daily and check CMP next week.*** Hypertension: See #3 above. Hyperlipidemia: Will check lipid panel and LP(a) next week along with CMP.*** CKD stage IV: Continue Farxiga and start losartan for renal protection. Elevated BMI: Will refer to pharmacy for recommendations regarding GLP-1 receptor agonist therapy.***        {Are you ordering a CV Procedure (e.g. stress test, cath, DCCV, TEE, etc)?   Press F2        :[id number]}   Dispo:  No follow-ups on file.      Medication Adjustments/Labs and Tests Ordered: Current  medicines are reviewed at length with the patient today.  Concerns regarding medicines are outlined above.  The following changes have been made:  {PLAN; NO CHANGE:13088:s}   Labs/tests ordered: No orders of the defined types were placed in this encounter.   Medication Changes: No orders of the defined types were placed in this encounter.   Current medicines are reviewed at length with the patient today.  The patient {ACTIONS; HAS/DOES NOT HAVE:19233} concerns regarding medicines.  I spent *** minutes reviewing all clinical data during and prior to this visit including all relevant imaging studies, laboratories, clinical information from other health systems and prior notes from both Cardiology and other specialties, interviewing the patient, conducting a complete physical examination, and coordinating care in order to formulate a comprehensive and personalized evaluation and treatment plan.  History of Present Illness:      FOCUSED PROBLEM LIST:   Paroxysmal atrial fibrillation CV 2 score of 5 On chronic amiodarone and Eliquis Cardioversion 2013 Type 2 diabetes mellitus  On insulin Hyperlipidemia Hypertension CKD stage IV, GFR 29 BMI 34 Chest pain Lexiscan low risk 2017 Fibromyalgia Esophageal spasm PRN nitroglycerin Obstructive sleep apnea Mild, home sleep study 2023  December 2024: The patient is here for routine follow-up.  She was last seen in June 2024.  She was doing well at that visit.  Her blood pressure was well-controlled.  A TSH and chest x-ray were ordered for monitoring of amiodarone.  Chest x-ray was without issues and the TSH was not performed.          Current Medications: No outpatient  medications have been marked as taking for the 04/04/23 encounter (Appointment) with Orbie Pyo, MD.     Review of Systems:   Please see the history of present illness.    All other systems reviewed and are negative.     EKGs/Labs/Other Test Reviewed:   EKG:  EKG in June 2024 demonstrates sinus rhythm with septal infarction pattern; I reviewed multiple EKGs since 2013 that demonstrated normal sinus rhythm.    EKG Interpretation Date/Time:    Ventricular Rate:    PR Interval:    QRS Duration:    QT Interval:    QTC Calculation:   R Axis:      Text Interpretation:           Risk Assessment/Calculations:   {Does this patient have ATRIAL FIBRILLATION?:[phone]}      Physical Exam:   VS:  There were no vitals taken for this visit.   No BP recorded.  {Refresh Note OR Click here to enter BP  :1}***   Wt Readings from Last 3 Encounters:  10/10/22 197 lb (89.4 kg)  02/07/22 205 lb 14.6 oz (93.4 kg)  11/29/21 205 lb 14.6 oz (93.4 kg)      GENERAL:  No apparent distress, AOx3 HEENT:  No carotid bruits, +2 carotid impulses, no scleral icterus CAR: RRR Irregular RR*** no murmurs***, gallops, rubs, or thrills RES:  Clear to auscultation bilaterally ABD:  Soft, nontender, nondistended, positive bowel sounds x 4 VASC:  +2 radial pulses, +2 carotid pulses NEURO:  CN 2-12 grossly intact; motor and sensory grossly intact PSYCH:  No active depression or anxiety EXT:  No edema, ecchymosis, or cyanosis  Signed, Orbie Pyo, MD  04/03/2023 3:15 PM    [facility] [address] [place], [place], [place]  [address] Phone: [phone]; Fax: [phone]   Note:  This document was prepared using Dragon voice recognition software and may include unintentional dictation errors.

## 2023-04-04 ENCOUNTER — Ambulatory Visit: Payer: Medicare Other | Attending: Internal Medicine | Admitting: Internal Medicine

## 2023-04-04 DIAGNOSIS — N184 Chronic kidney disease, stage 4 (severe): Secondary | ICD-10-CM

## 2023-04-04 DIAGNOSIS — D6869 Other thrombophilia: Secondary | ICD-10-CM

## 2023-04-04 DIAGNOSIS — Z794 Long term (current) use of insulin: Secondary | ICD-10-CM

## 2023-04-04 DIAGNOSIS — I480 Paroxysmal atrial fibrillation: Secondary | ICD-10-CM

## 2023-04-04 DIAGNOSIS — E1169 Type 2 diabetes mellitus with other specified complication: Secondary | ICD-10-CM

## 2023-04-04 DIAGNOSIS — E1159 Type 2 diabetes mellitus with other circulatory complications: Secondary | ICD-10-CM

## 2023-04-04 DIAGNOSIS — Z6834 Body mass index (BMI) 34.0-34.9, adult: Secondary | ICD-10-CM

## 2023-05-24 ENCOUNTER — Ambulatory Visit: Payer: Medicare Other | Admitting: Podiatry

## 2023-06-01 ENCOUNTER — Ambulatory Visit: Payer: Medicare Other | Admitting: Nurse Practitioner

## 2023-06-25 ENCOUNTER — Telehealth: Payer: Self-pay | Admitting: Podiatry

## 2023-06-25 NOTE — Telephone Encounter (Signed)
 Pt left message 2/28 at 428pm stating she was needing to schedule an appt for herself for nail trim.  I returned call and lvm for pt to call back to schedule an appt.

## 2023-07-19 ENCOUNTER — Ambulatory Visit: Admitting: Podiatry

## 2023-07-25 ENCOUNTER — Other Ambulatory Visit: Payer: Self-pay

## 2023-07-25 DIAGNOSIS — I480 Paroxysmal atrial fibrillation: Secondary | ICD-10-CM

## 2023-07-25 MED ORDER — ELIQUIS 5 MG PO TABS: 5.0000 mg | ORAL_TABLET | Freq: Two times a day (BID) | ORAL | 1 refills | Status: DC

## 2023-07-25 NOTE — Telephone Encounter (Signed)
 Prescription refill request for Eliquis received. Indication: Afib  Last office visit: 10/10/22 Asa Lente)  Scr: 1.8 (07/04/23)  Age: 78 Weight: 89.4kg  Appropriate dose. Refill sent.

## 2023-10-25 ENCOUNTER — Ambulatory Visit: Admitting: Podiatry

## 2023-11-10 NOTE — Progress Notes (Unsigned)
 Cardiology Office Note:  .   Date:  11/12/2023  ID:  [name], DOB [birth year], MRN [id number] PCP: Charlott Dorn LABOR, MD  [facility] Providers Cardiologist:  Lurena MARLA Red, MD {  History of Present Illness: .   [name] is a 78 y.o. female with a history of PAF, DM, MPV, HLD, fibromyalgia who presents today for annual follow-up appointment.  She was last seen in office March 2023 where she was doing well.  She did not have any lower extremity edema, syncope, bleeding on anticoagulation, episodes of A-fib or other complaints at that time.  She does not do her own shopping due to fibromyalgia.  Husband does all of physical work around the house.  She is becoming much less able to participate in regular activities at that time.  Does independently ambulate.  She was seen by me 10/10/22, she wants to come every 6 months but some canceling occurred on both ends. She has not had any issues. DCCV has helped. She is on nitroglycerin for her esophageal spasms, has not needed recently.  She is going to [place] for follow-up.  She has not had any chest pain.  She used to get this with her palpitations.  She tells me that once a year Dr. Claudene will order a chest x-ray since she is on amiodarone .  We can take care of this today.   Reports no shortness of breath nor dyspnea on exertion. Reports no chest pain, pressure, or tightness. No edema, orthopnea, PND. Reports no palpitations.   Today, she presents with atrial fibrillation and sleep apnea here with chest heaviness and shortness of breath.  She experiences chest heaviness and shortness of breath, which she associates with her atrial fibrillation. She has remained in normal rhythm since conversion, with no episodes of atrial fibrillation or tachycardia during these symptoms. Her blood pressure is monitored, with diastolic readings sometimes as low as 52.  Her oxygen saturation occasionally drops to 90%, and she has mild  sleep apnea with previous oxygen levels dropping to 85%. She suspects low oxygen levels upon waking, correlating with chest heaviness and shortness of breath. Her family history includes her sister and mother having similar issues, with her mother requiring nocturnal oxygen.  She is on Lopressor  25 mg twice daily, oxycodone  reduced to 30 mg up to three times daily for chronic pain, and amiodarone  for atrial fibrillation. A chemical stress test in 2017 showed no ischemia and good heart pump function   No edema, orthopnea, PND. Reports no palpitations.   Discussed the use of AI scribe software for clinical note transcription with the patient, who gave verbal consent to proceed.   ROS: ROS in HPI  Studies Reviewed: SABRA    EKG: Normal sinus consistent with previous EKG  Risk Assessment/Calculations:    CHA2DS2-VASc Score =     This indicates a  % annual risk of stroke. The patient's score is based upon:             Physical Exam:   VS:  BP (!) 98/58   Pulse 65   Ht 5' 4.5 (1.638 m)   Wt 214 lb 6.4 oz (97.3 kg)   SpO2 95%   BMI 36.23 kg/m    Wt Readings from Last 3 Encounters:  11/12/23 214 lb 6.4 oz (97.3 kg)  10/10/22 197 lb (89.4 kg)  02/07/22 205 lb 14.6 oz (93.4 kg)    GEN: Well nourished, well developed in no  acute distress NECK: No JVD; No carotid bruits CARDIAC: RRR, no murmurs, rubs, gallops RESPIRATORY:  Clear to auscultation without rales, wheezing or rhonchi  ABDOMEN: Soft, non-tender, non-distended EXTREMITIES:  No edema; No deformity   ASSESSMENT AND PLAN: .    Atrial Fibrillation Intermittent chest heaviness possibly linked to low oxygen saturation. No recent AFib episodes. Last stress test in 2017 showed no ischemia. Concern for coronary artery disease due to atypical symptoms. Amiodarone  toxicity surveillance needed. - Order chemical stress test for coronary artery disease evaluation. - Reduce metoprolol  to half a tab morning and evening. - Order chest  x-ray for amiodarone  pulmonary toxicity monitoring. - Ensure annual thyroid  and liver function tests.  Low Blood Pressure Low diastolic blood pressure possibly due to metoprolol . - Reduce metoprolol  to half a tab morning and evening. -monitor daily  Sleep Apnea Mild sleep apnea with oxygen saturation dropping to 85%. Symptoms may relate to low oxygen saturation. Family history of significant sleep apnea. - Order at-home sleep study to reassess severity and oxygen levels. - Refer to Dr. Shlomo for potential CPAP setup.  Chronic Fatigue Syndrome Chronic fatigue syndrome. Monitoring thyroid  function and amiodarone  effects is crucial.  Chronic Kidney Disease, Stage 3 Stage 3 CKD with stable creatinine at 1.4. Potassium level improved from 5.6 to 4.5. - Recheck potassium level for stability.  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 TSH levels normal recently but require monitoring due to amiodarone  use. - Check thyroid  panel at least annually, possibly biannually.  Chronic Pain Chronic pain managed with reduced oxycodone . Pain exacerbated by fibromyalgia, knee, and hip issues.        Dispo: She can come back in 6 months to see Dr.Thukkani to establish care  Signed, Orren LOISE Fabry, PA-C

## 2023-11-12 ENCOUNTER — Ambulatory Visit: Attending: Physician Assistant | Admitting: Physician Assistant

## 2023-11-12 ENCOUNTER — Encounter: Payer: Self-pay | Admitting: Physician Assistant

## 2023-11-12 VITALS — BP 98/58 | HR 65 | Ht 64.5 in | Wt 214.4 lb

## 2023-11-12 DIAGNOSIS — T462X4D Poisoning by other antidysrhythmic drugs, undetermined, subsequent encounter: Secondary | ICD-10-CM

## 2023-11-12 DIAGNOSIS — D6869 Other thrombophilia: Secondary | ICD-10-CM

## 2023-11-12 DIAGNOSIS — I480 Paroxysmal atrial fibrillation: Secondary | ICD-10-CM

## 2023-11-12 DIAGNOSIS — T462X4S Poisoning by other antidysrhythmic drugs, undetermined, sequela: Secondary | ICD-10-CM

## 2023-11-12 DIAGNOSIS — G4719 Other hypersomnia: Secondary | ICD-10-CM

## 2023-11-12 DIAGNOSIS — G4733 Obstructive sleep apnea (adult) (pediatric): Secondary | ICD-10-CM | POA: Diagnosis not present

## 2023-11-12 DIAGNOSIS — I10 Essential (primary) hypertension: Secondary | ICD-10-CM | POA: Diagnosis not present

## 2023-11-12 DIAGNOSIS — I4811 Longstanding persistent atrial fibrillation: Secondary | ICD-10-CM

## 2023-11-12 DIAGNOSIS — R079 Chest pain, unspecified: Secondary | ICD-10-CM

## 2023-11-12 DIAGNOSIS — Z79899 Other long term (current) drug therapy: Secondary | ICD-10-CM

## 2023-11-12 MED ORDER — METOPROLOL TARTRATE 25 MG PO TABS: 12.5000 mg | ORAL_TABLET | Freq: Two times a day (BID) | ORAL | Status: DC

## 2023-11-12 NOTE — Patient Instructions (Addendum)
 Medication Instructions:  DECREASE METOPROLOL  TARTRATE TO 12.5 MG TWICE A DAY *If you need a refill on your cardiac medications before your next appointment, please call your pharmacy*  Lab Work: BMET TODAY If you have labs (blood work) drawn today and your tests are completely normal, you will receive your results only by: MyChart Message (if you have MyChart) OR A paper copy in the mail If you have any lab test that is abnormal or we need to change your treatment, we will call you to review the results.  Testing/Procedures: The chest X ray has been ordered ---go to [facility] tomorrow morning --[address]---I will call you when I get the report (usually about an hour after you complete the X ray. There is no appointment needed just show up anytime after 8 am --the sooner you go the sooner I will have an answer     Orren Fabry, PA-C has ordered a Lexiscan  Myocardial Perfusion Imaging Study.  Please arrive 15 minutes prior to your appointment time for registration and insurance purposes.   The test will take approximately 3 to 4 hours to complete; you may bring reading material.  If someone comes with you to your appointment, they will need to remain in the main lobby due to limited space in the testing area. **If you are pregnant or breastfeeding, please notify the nuclear lab prior to your appointment**   How to prepare for your Myocardial Perfusion Test: Do not eat or drink 3 hours prior to your test, except you may have water. Do not consume products containing caffeine (regular or decaffeinated) 12 hours prior to your test. (ex: coffee, chocolate, sodas, tea). Do wear comfortable clothes (no dresses or overalls) and walking shoes, tennis shoes preferred (No heels or open toe shoes are allowed). Do NOT wear cologne, perfume, aftershave, or lotions (deodorant is allowed). If you use an inhaler, use it the AM of your test and bring it with you.  If you use a nebulizer,  use it the AM of your test.  If these instructions are not followed, your test will have to be rescheduled.   Follow-Up: At [facility], you and your health needs are our priority.  As part of our continuing mission to provide you with exceptional heart care, our providers are all part of one team.  This team includes your primary Cardiologist (physician) and Advanced Practice Providers or APPs (Physician Assistants and Nurse Practitioners) who all work together to provide you with the care you need, when you need it.  Your next appointment:   3-6 month(s)  Provider:   Lurena Red, MD  Other Instructions WatchPAT?  Is a FDA cleared portable home sleep study test that uses a watch and 3 points of contact to monitor 7 different channels, including your heart rate, oxygen saturations, body position, snoring, and chest motion.  The study is easy to use from the comfort of your own home and accurately detect sleep apnea.  Before bed, you attach the chest sensor, attached the sleep apnea bracelet to your nondominant hand, and attach the finger probe.  After the study, the raw data is downloaded from the watch and scored for apnea events.   For more information: [email or web address]  Patient Testing Instructions:  Do not put battery into the device until bedtime when you are ready to begin the test. Please call the support number if you need assistance after following the instructions below: 24 hour support line- [phone] or [facility] support  at [phone] (option 2)  Download the [employer] WatchPAT One app through the google play store or [employer]  Be sure to turn on or enable access to bluetooth in settlings on your smartphone/ device  Make sure no other bluetooth devices are on and within the vicinity of your smartphone/ device and WatchPAT watch during testing.  Make sure to leave your smart phone/ device plugged in and charging all night.  When ready for bed:   Follow the instructions step by step in the WatchPAT One App to activate the testing device. For additional instructions, including video instruction, visit the WatchPAT One video on Youtube. You can search for WatchPat One within Youtube (video is 4 minutes and 18 seconds) or enter: https://youtube/watch?[email or web address]_vbiwxE Please note: You will be prompted to enter a Pin to connect via bluetooth when starting the test. The PIN will be assigned to you when you receive the test.  The device is disposable, but it recommended that you retain the device until you receive a call letting you know the study has been received and the results have been interpreted.  We will let you know if the study did not transmit to us  properly after the test is completed. You do not need to call us  to confirm the receipt of the test.  Please complete the test within 48 hours of receiving PIN.   Frequently Asked Questions:  What is Watch Bruna one?  A single use fully disposable home sleep apnea testing device and will not need to be returned after completion.  What are the requirements to use WatchPAT one?  The be able to have a successful watchpat one sleep study, you should have your Watch pat one device, your smart phone, watch pat one app, your PIN number and Internet access What type of phone do I need?  You should have a smart phone that uses Android 5.1 and above or any Iphone with IOS 10 and above How can I download the WatchPAT one app?  Based on your device type search for WatchPAT one app either in google play for android devices or APP store for Iphone's Where will I get my PIN for the study?  Your PIN will be provided by your physician's office. It is used for authentication and if you lose/forget your PIN, please reach out to your providers office.  I do not have Internet at home. Can I do WatchPAT one study?  WatchPAT One needs Internet connection throughout the night to be able to transmit the sleep data. You  can use your home/local internet or your cellular's data package. However, it is always recommended to use home/local Internet. It is estimated that between 20MB-30MB will be used with each study.However, the application will be looking for space in the phone to start the study.  What happens if I lose internet or bluetooth connection?  During the internet disconnection, your phone will not be able to transmit the sleep data. All the data, will be stored in your phone. As soon as the internet connection is back on, the phone will being sending the sleep data. During the bluetooth disconnection, WatchPAT one will not be able to to send the sleep data to your phone. Data will be kept in the WatchPAT one until two devices have bluetooth connection back on. As soon as the connection is back on, WatchPAT one will send the sleep data to the phone.  How long do I need to wear the WatchPAT one?  After  you start the study, you should wear the device at least 6 hours.  How far should I keep my phone from the device?  During the night, your phone should be within 15 feet.  What happens if I leave the room for restroom or other reasons?  Leaving the room for any reason will not cause any problem. As soon as your get back to the room, both devices will reconnect and will continue to send the sleep data. Can I use my phone during the sleep study?  Yes, you can use your phone as usual during the study. But it is recommended to put your watchpat one on when you are ready to go to bed.  How will I get my study results?  A soon as you completed your study, your sleep data will be sent to the provider. They will then share the results with you when they are ready.

## 2023-11-13 ENCOUNTER — Telehealth (HOSPITAL_COMMUNITY): Payer: Self-pay | Admitting: *Deleted

## 2023-11-13 ENCOUNTER — Ambulatory Visit: Payer: Self-pay | Admitting: *Deleted

## 2023-11-13 ENCOUNTER — Telehealth: Payer: Self-pay | Admitting: *Deleted

## 2023-11-13 LAB — BASIC METABOLIC PANEL WITH GFR
BUN/Creatinine Ratio: 15 (ref 12–28)
BUN: 27 mg/dL (ref 8–27)
CO2: 25 mmol/L (ref 20–29)
Calcium: 9.7 mg/dL (ref 8.7–10.3)
Chloride: 104 mmol/L (ref 96–106)
Creatinine, Ser: 1.86 mg/dL — ABNORMAL HIGH (ref 0.57–1.00)
Glucose: 42 mg/dL — ABNORMAL LOW (ref 70–99)
Potassium: 4.7 mmol/L (ref 3.5–5.2)
Sodium: 143 mmol/L (ref 134–144)
eGFR: 28 mL/min/1.73 — ABNORMAL LOW (ref >59)

## 2023-11-13 NOTE — Telephone Encounter (Signed)
 The patient has been notified of the result and verbalized understanding.  All questions (if any) were answered.  Pt aware we will forward a copy of this report to her Urologist,  Dr. Leita Saba.  Pt verbalized understanding and agrees with this plan.

## 2023-11-13 NOTE — Telephone Encounter (Signed)
-----   Message from [facility] Destany H sent at 11/12/2023  3:48 PM EDT ----- At home sleep study has been ordered for patient, please reach out to patient with and instructions and when device can be picked up, thank you

## 2023-11-13 NOTE — Telephone Encounter (Signed)
 Left message on voicemail per DPR in reference to upcoming appointment scheduled on  7/28/25with detailed instructions given per Myocardial Perfusion Study Information Sheet for the test. LM to arrive 15 minutes early, and that it is imperative to arrive on time for appointment to keep from having the test rescheduled. If you need to cancel or reschedule your appointment, please call the office within 24 hours of your appointment. Failure to do so may result in a cancellation of your appointment, and a $50 no show fee. Phone number given for call back for any questions. Claudene Ronal Quale, RN

## 2023-11-13 NOTE — Telephone Encounter (Signed)
 Ordering provider: ORREN FABRY Associated diagnoses: G47.19 WatchPAT PA obtained on 11/13/2023 by Joshua Dalton Seip, CMA. Authorization: No; tracking ID NO AUTH REQ Patient notified of PIN (1234) on 11/13/2023 via Notification Method: phone.

## 2023-11-13 NOTE — Telephone Encounter (Signed)
 Will forward this message on behalf of our sleep team, to Tessa Conte PA-C's covering CMA, to call the pt in 2 weeks (on 8/5) if WatchPAT study results are not available yet. Remind patient to complete test.

## 2023-11-15 ENCOUNTER — Telehealth: Payer: Self-pay | Admitting: *Deleted

## 2023-11-15 NOTE — Telephone Encounter (Signed)
 Patient agreement reviewed and signed on 11/15/2023.  WatchPAT issued to patient on 11/15/2023 by Malvina Pina A. Patient aware to not open the WatchPAT box until contacted with the activation PIN. Patient profile initialized in CloudPAT on 11/15/2023 by Pina Malvina. Device serial number: [id number]  Please list Reason for Call as Advice Only and type WatchPAT issued to patient in the comment box.

## 2023-11-16 ENCOUNTER — Other Ambulatory Visit: Payer: Self-pay | Admitting: Physician Assistant

## 2023-11-16 DIAGNOSIS — R079 Chest pain, unspecified: Secondary | ICD-10-CM

## 2023-11-19 ENCOUNTER — Other Ambulatory Visit: Payer: Self-pay | Admitting: Physician Assistant

## 2023-11-19 ENCOUNTER — Ambulatory Visit (HOSPITAL_COMMUNITY)
Admission: RE | Admit: 2023-11-19 | Source: Ambulatory Visit | Attending: Physician Assistant | Admitting: Physician Assistant

## 2023-11-19 DIAGNOSIS — R079 Chest pain, unspecified: Secondary | ICD-10-CM

## 2023-11-19 NOTE — Addendum Note (Signed)
 Addended by: CONNY KIEF T on: 11/19/2023 08:03 AM   Modules accepted: Orders

## 2023-11-26 ENCOUNTER — Telehealth (HOSPITAL_COMMUNITY): Payer: Self-pay

## 2023-11-26 NOTE — Telephone Encounter (Signed)
 Detailed instructions left on the patient's answering machine. S.Jassiel Flye CCT

## 2023-11-26 NOTE — Addendum Note (Signed)
 Addended by: LUCIEN ORREN SAILOR on: 11/26/2023 07:57 AM   Modules accepted: Orders

## 2023-12-04 ENCOUNTER — Ambulatory Visit (HOSPITAL_COMMUNITY)
Admission: RE | Admit: 2023-12-04 | Discharge: 2023-12-04 | Disposition: A | Discharge status: Home / Self Care | Source: Ambulatory Visit | Attending: Cardiology | Admitting: Cardiology

## 2023-12-04 ENCOUNTER — Ambulatory Visit
Admission: RE | Admit: 2023-12-04 | Discharge: 2023-12-04 | Disposition: A | Discharge status: Home / Self Care | Source: Ambulatory Visit | Attending: Physician Assistant | Admitting: Physician Assistant

## 2023-12-04 DIAGNOSIS — R079 Chest pain, unspecified: Secondary | ICD-10-CM | POA: Diagnosis present

## 2023-12-04 LAB — MYOCARDIAL PERFUSION IMAGING
Base ST Depression (mm): 0 mm
LV dias vol: 103 mL (ref 46–106)
LV sys vol: 22 mL (ref 3.8–5.2)
Nuc Stress EF: 79 %
Peak HR: 80 {beats}/min
Rest HR: 70 {beats}/min
Rest Nuclear Isotope Dose: 10.7 mCi
SDS: 0
SRS: 1
SSS: 0
ST Depression (mm): 0 mm
Stress Nuclear Isotope Dose: 31 mCi
TID: 0.85

## 2023-12-04 MED ORDER — REGADENOSON 0.4 MG/5ML IV SOLN
0.4000 mg | Freq: Once | INTRAVENOUS | Status: AC
  Administered 2023-12-04 (×2): 0.4 mg via INTRAVENOUS

## 2023-12-04 MED ORDER — TECHNETIUM TC 99M TETROFOSMIN IV KIT
10.7000 | PACK | Freq: Once | INTRAVENOUS | Status: AC | PRN
  Administered 2023-12-04 (×2): 10.7 via INTRAVENOUS

## 2023-12-04 MED ORDER — REGADENOSON 0.4 MG/5ML IV SOLN
INTRAVENOUS | Status: AC
  Filled 2023-12-04: qty 5

## 2023-12-04 MED ORDER — TECHNETIUM TC 99M TETROFOSMIN IV KIT
31.0000 | PACK | Freq: Once | INTRAVENOUS | Status: AC | PRN
  Administered 2023-12-04 (×2): 31 via INTRAVENOUS

## 2023-12-10 ENCOUNTER — Other Ambulatory Visit: Payer: Self-pay | Admitting: Physician Assistant

## 2023-12-10 DIAGNOSIS — I480 Paroxysmal atrial fibrillation: Secondary | ICD-10-CM

## 2023-12-13 ENCOUNTER — Ambulatory Visit: Payer: Self-pay | Admitting: Physician Assistant

## 2023-12-17 ENCOUNTER — Other Ambulatory Visit: Payer: Self-pay

## 2023-12-17 DIAGNOSIS — I480 Paroxysmal atrial fibrillation: Secondary | ICD-10-CM

## 2023-12-17 MED ORDER — AMIODARONE HCL 200 MG PO TABS
100.0000 mg | ORAL_TABLET | Freq: Every day | ORAL | 3 refills | Status: AC
Start: 2023-12-17 — End: ?

## 2023-12-17 MED ORDER — METOPROLOL TARTRATE 25 MG PO TABS: 12.5000 mg | ORAL_TABLET | Freq: Two times a day (BID) | ORAL | 3 refills | Status: DC

## 2023-12-27 ENCOUNTER — Other Ambulatory Visit: Payer: Self-pay | Admitting: Pharmacist

## 2023-12-27 DIAGNOSIS — I480 Paroxysmal atrial fibrillation: Secondary | ICD-10-CM

## 2023-12-27 MED ORDER — ELIQUIS 5 MG PO TABS: 5.0000 mg | ORAL_TABLET | Freq: Two times a day (BID) | ORAL | 1 refills | Status: AC

## 2023-12-27 NOTE — Telephone Encounter (Signed)
 Prescription refill request for Eliquis  received. Indication: a fib Last office visit: 11/12/23 Scr: 1.86 epic 7.21.25 Age: 77 Weight: 97kg

## 2024-01-25 ENCOUNTER — Ambulatory Visit: Admitting: Podiatry

## 2024-01-25 DIAGNOSIS — M79674 Pain in right toe(s): Secondary | ICD-10-CM | POA: Diagnosis not present

## 2024-01-25 DIAGNOSIS — B351 Tinea unguium: Secondary | ICD-10-CM | POA: Diagnosis not present

## 2024-01-25 DIAGNOSIS — M79675 Pain in left toe(s): Secondary | ICD-10-CM

## 2024-01-25 DIAGNOSIS — L84 Corns and callosities: Secondary | ICD-10-CM

## 2024-01-25 DIAGNOSIS — E1151 Type 2 diabetes mellitus with diabetic peripheral angiopathy without gangrene: Secondary | ICD-10-CM

## 2024-01-25 NOTE — Progress Notes (Signed)
 Subjective:  Patient ID: [name], female    DOB: Sep 18, 1945,  MRN: [id number]  [name] presents to clinic today for:  Chief Complaint  Patient presents with   [facility]    [facility] with callous A1c was 7.6 in    Patient notes nails are thick and elongated, causing pain in shoe gear when ambulating.  She has painful calluses bilateral submet 1 and left submet 5.  The patient was able to be seen today but her husband needed to reschedule due to their tardiness to the scheduled appointments today.  Patient states that she was supposed to be given some cream last time, but states she walked off and left and on the counter.  She is requesting it today.  We did not have note of anything left on our counter that would have been marked for the patient to pick up at the subsequent appointment.  PCP is Charlott Dorn LABOR, MD. last seen around 01/03/2024  Past Medical History:  Diagnosis Date   ADHD (attention deficit hyperactivity disorder)    Anticoagulant long-term use    eliquis --- managed by cardiology   Chronic constipation    Chronic fatigue syndrome    followed by dr carlin lapp in [place] ([facility] clinic)   CKD (chronic kidney disease), stage III (HCC)    followed by nephrologist--- dr katheryn foster  [facility])   Complication of anesthesia 1975   projectile vomitting   DDD (degenerative disc disease), cervical    Diabetic neuropathy (HCC)    Dyslipidemia    Esophageal dysmotility 2023   barium swallow done on 09/07/21 that showed moderate esophageal dysmotility, mild to moderate persistent narrowing of the distal esophagus.   Esophageal stricture    followed by [facility] (dr saintclair);   hx balloon dilatation 08/ 2014;  takes nitro as needed for esophageal pain   Fibromyalgia    Full dentures    Gait instability    GERD (gastroesophageal reflux disease)    History of colon polyps    Insulin  dependent type 2 diabetes mellitus (HCC)    followed by pcp   (10-19-2021  pt  stated checks blood sugar QID,  fasting average 70-140)   Mild obstructive sleep apnea 10/18/2012   sleep study on 10/05/21 revealed mild obstructive sleep apnea. Pt follows with Dr. Carolynne Allan, Pulmonology, LOV 08/10/21 in Epic.   Osteoporosis    PAF (paroxysmal atrial fibrillation) [facility])    cardiologist--- dr h. claudene;   hx DCCV 04/ 2013;   PONV (postoperative nausea and vomiting)    Rosacea    Secondary hyperparathyroidism of renal origin    Vitamin D deficiency 10/18/2012   Allergies  Allergen Reactions   Iohexol Other (See Comments)     Desc: EYES & LIPS SWELLING, SOB DURING A MYELOGRAM '88, DECADRON  GIVEN/ PRE MEDS REQUIRED/A.C., Onset Date: [id number]    Morphine And Codeine Other (See Comments)    Hyperactivity / climbs the wall's   Penicillins Hives and Swelling    Has patient had a PCN reaction causing immediate rash, facial/tongue/throat swelling, SOB or lightheadedness with hypotension: Yes Has patient had a PCN reaction causing severe rash involving mucus membranes or skin necrosis: No Has patient had a PCN reaction that required hospitalization No Has patient had a PCN reaction occurring within the last 10 years: No If all of the above answers are NO, then may proceed with Cephalosporin use.    Shellfish Allergy Hives and Swelling  Hard shellfish   Demerol Nausea And Vomiting    Can take with Phenergan   Avandia [Rosiglitazone] Swelling    Other reaction(s): Weight gain and edema   Latex Rash    Contact dermatitis   Pradaxa [Dabigatran Etexilate Mesylate] Other (See Comments)    Headaches    Victoza [Liraglutide] Other (See Comments)    headaches    Objective:  [name] is a pleasant 78 y.o. female in NAD. AAO x 3.  Vascular Examination: Patient has palpable DP pulse, absent PT pulse bilateral.  Delayed capillary refill bilateral toes.  Sparse digital hair bilateral.  Proximal to distal cooling WNL bilateral.    Dermatological  Examination: Interspaces are clear with no open lesions noted bilateral.  Skin is shiny and atrophic bilateral.  Nails are 3-56mm thick, with yellowish/brown discoloration, subungual debris and distal onycholysis x10.  There is pain with compression of nails x10.  There are hyperkeratotic lesions noted bilateral submet 1 and left submet 5.  Patient qualifies for at-risk foot care because of diabetes with PVD.  Assessment/Plan: 1. Pain due to onychomycosis of toenails of both feet   2. Callus of foot   3. Type II diabetes mellitus with peripheral circulatory disorder (HCC)     Mycotic nails x10 were sharply debrided with sterile nail nippers and power debriding burr to decrease bulk and length.  Hyperkeratotic lesions x 3 were shaved with #312 blade.  Locations are listed above in the dermatological examination  If any cream was going to be given to the patient at her last appointment, it would have been the Derm 42 cream for rough skin and calluses.  We are out of stock of samples to give out at this time.  Therefore recommended RevitaDerm 40 cream for the skin and calluses.  Also recommend Tolcylen solution for the toenails.  This will be applied daily for up to a year.  Return in about 3 months (around 04/26/2024) for [facility].   [name], DPM, FACFAS [facility]     [address] [place], [place] [address]                Office [phone]  Fax [phone]

## 2024-02-19 NOTE — Progress Notes (Unsigned)
 Cardiology Office Note:   Date:  02/21/2024  ID:  [name], [name] [birth year], MRN [id number] PCP:  Charlott Dorn LABOR, MD  [facility] Providers Cardiologist:  Wendel Haws, MD Referring MD: Charlott Dorn LABOR, *  Chief Complaint/Reason for Referral: Follow-up for PAF ASSESSMENT:    1. Paroxysmal atrial fibrillation (HCC)   2. Secondary hypercoagulable state   3. Type 2 diabetes mellitus with complication, with long-term current use of insulin  (HCC)   4. Hypertension associated with diabetes (HCC)   5. Hyperlipidemia associated with type 2 diabetes mellitus (HCC)   6. Aortic atherosclerosis   7. CKD (chronic kidney disease) stage 4, GFR 15-29 ml/min (HCC)   8. BMI 36.0-36.9,adult   9. Snoring     PLAN:   In order of problems listed above: PAF: Remains in normal sinus rhythm.  Continue amiodarone  100 mg daily, Eliquis  5 mg twice daily, and beta-blocker with adjustment as below.  Check reflex TSH, LFTs today.  Chest x-ray in August was reassuring.   Secondary hypercoagulable state: Continue Eliquis  5 mg twice daily T2DM: Continue Eliquis  5 mg twice daily, atorvastatin 80 mg daily, Farxiga 10 mg daily.  BP too low for ARB. Hypertension: BP somewhat low today and the patient has had dizzy spells.  Will stop Lopressor  12.5 mg twice daily and start Toprol -XL 12.5 mg at bedtime.   Hyperlipidemia: Continue atorvastatin 80 mg daily.  LDL was 68 in May of this year.   LP(a) today. Aortic atherosclerosis: Continue Eliquis  5 mg twice daily, atorvastatin 80 mg CKD stage IV: Continue Farxiga 10 mg.  Elevated BMI: Continue Ozempic. Snoring: Will refer for sleep study.            Dispo:  Return in about 6 months (around 08/21/2024).       I spent 38 minutes reviewing all clinical data during and prior to this visit including all relevant imaging studies, laboratories, clinical information from other health systems and prior notes from both Cardiology and other specialties,  interviewing the patient, conducting a complete physical examination, and coordinating care in order to formulate a comprehensive and personalized evaluation and treatment plan.   History of Present Illness:    FOCUSED PROBLEM LIST:   PAF Cardioversion 2013 T2DM On insulin  Coronary artery calcification Low risk; lexiscan  stress test 2025 Hyperlipidemia Aortic atherosclerosis Lexiscan  stress test 2025 CKD stage IV BMI 36   October 2025:  Patient consents to use of AI scribe. The patient returns for routine follow-up.  She was last seen in July of this year.  She  reported chest heaviness and shortness of breath associated with her atrial fibrillation.  She was referred for Lexiscan  stress test which demonstrated low risk findings.  She experiences occasional lightheadedness and has had a couple of falls. She is going to be starting balance and strength physical therapy. Her blood pressure has been low at times, with readings as low in the 90s systolic.  She has mild to moderate sleep apnea, identified in a 2023 sleep study, with oxygen saturation dropping to 85%. She reports snoring, daytime fatigue, and unrefreshing sleep. Her family history includes similar issues with her mother and sister.  No recent emergency room visits or hospitalizations. No chest pain recently and her breathing is generally fine, though she may experience slight shortness of breath after climbing stairs.     Current Medications: Current Meds  Medication Sig   acetaminophen  (TYLENOL ) 500 MG tablet Take 500 mg by mouth every 6 (six) hours as needed  for mild pain or fever.    amiodarone  (PACERONE ) 200 MG tablet Take 0.5 tablets (100 mg total) by mouth daily.   amphetamine-dextroamphetamine (ADDERALL) 20 MG tablet Take 20 mg by mouth 3 (three) times daily as needed (ADD).   atorvastatin (LIPITOR) 80 MG tablet Take 80 mg by mouth at bedtime.   BD PEN NEEDLE NANO U/F 32G X 4 MM MISC    buPROPion (WELLBUTRIN XL)  300 MG 24 hr tablet Take 300 mg by mouth every morning.   Cholecalciferol (VITAMIN D) 125 MCG (5000 UT) CAPS Take 5,000 Units by mouth daily.    cyanocobalamin (VITAMIN B12) 1000 MCG/ML injection Inject into the muscle.   DULoxetine (CYMBALTA) 30 MG capsule Take 30 mg by mouth daily.   ELIQUIS  5 MG TABS tablet Take 1 tablet (5 mg total) by mouth 2 (two) times daily.   esomeprazole  (NEXIUM ) 40 MG capsule TAKE 1 CAPSULE TWICE DAILY BEFORE MEALS (Patient taking differently: Take 40 mg by mouth daily.)   FARXIGA 10 MG TABS tablet Take 10 mg by mouth daily.   fluticasone  (FLONASE ) 50 MCG/ACT nasal spray Place 1 spray into both nostrils daily. (Patient taking differently: Place 1 spray into both nostrils daily as needed.)   FREESTYLE LITE test strip 4 (four) times daily.   furosemide  (LASIX ) 20 MG tablet Take 0.5 tablets (10 mg total) by mouth daily as needed for fluid.   hydrOXYzine (ATARAX) 25 MG tablet Take by mouth. (Patient taking differently: Take 50 mg by mouth daily at 6 (six) AM.)   insulin  glargine (LANTUS) 100 UNIT/ML injection Inject 40 Units into the skin daily. (Patient taking differently: Inject 50 Units into the skin daily.)   insulin  lispro (HUMALOG) 100 UNIT/ML injection Inject 10-25 Units into the skin 3 (three) times daily. Inject 10-25 units into the skin 3 times daily  per SS (Patient taking differently: Inject 25 Units into the skin 3 (three) times daily with meals. Inject 10-25 units into the skin 3 times daily  per SS)   ketoconazole (NIZORAL) 2 % cream Apply 1 Application topically daily as needed for irritation.   Melatonin 10 MG TBDP Take 10 mg by mouth at bedtime as needed (sleep).   metoprolol  succinate (TOPROL -XL) 25 MG 24 hr tablet Take 0.5 tablets (12.5 mg total) by mouth daily.   metroNIDAZOLE (METROCREAM) 0.75 % cream Apply 1 Application topically daily as needed (rosacea).   MOVANTIK  12.5 MG TABS tablet Take 12.5 mg by mouth daily as needed (opioid induced  constipation).   nitroGLYCERIN (NITROSTAT) 0.4 MG SL tablet Place 0.4 mg under the tongue every 5 (five) minutes as needed for chest pain.   oxyCODONE  (OXY IR/ROXICODONE ) 5 MG immediate release tablet Take 5 mg by mouth every 6 (six) hours as needed (for breakthrough pain).   OXYCONTIN  30 MG 12 hr tablet Take 1 tablet by mouth 3 (three) times daily.   pregabalin (LYRICA) 150 MG capsule Take 150 mg by mouth 2 (two) times daily.   Semaglutide,0.25 or 0.5MG /DOS, (OZEMPIC, 0.25 OR 0.5 MG/DOSE,) 2 MG/1.5ML SOPN Inject into the skin.   sertraline (ZOLOFT) 100 MG tablet Take 200 mg by mouth daily.   tetrahydrozoline 0.05 % ophthalmic solution    tiZANidine (ZANAFLEX) 2 MG tablet Take 2 mg by mouth 3 (three) times daily as needed for muscle spasms.   [DISCONTINUED] metoprolol  tartrate (LOPRESSOR ) 25 MG tablet Take 0.5 tablets (12.5 mg total) by mouth 2 (two) times daily.     Review of Systems:   Please  see the history of present illness.    All other systems reviewed and are negative.     EKGs/Labs/Other Test Reviewed:   EKG: 2024 normal sinus rhythm  EKG Interpretation Date/Time:    Ventricular Rate:    PR Interval:    QRS Duration:    QT Interval:    QTC Calculation:   R Axis:      Text Interpretation:          CARDIAC STUDIES: Refer to CV Procedures and Imaging Tabs   Risk Assessment/Calculations:    CHA2DS2-VASc Score = 4   This indicates a 4.8% annual risk of stroke. The patient's score is based upon: CHF History: 0 HTN History: 0 Diabetes History: 1 Stroke History: 0 Vascular Disease History: 0 Age Score: 2 Gender Score: 1    STOP-Bang Score:  6       Physical Exam:   VS:  BP 100/60 (BP Location: Right Arm, Patient Position: Sitting, Cuff Size: Large)   Pulse (!) 56   Ht 5' 5 (1.651 m)   Wt 220 lb (99.8 kg)   SpO2 94%   BMI 36.61 kg/m        Wt Readings from Last 3 Encounters:  02/21/24 220 lb (99.8 kg)  12/04/23 214 lb (97.1 kg)  11/12/23 214 lb 6.4  oz (97.3 kg)      GENERAL:  No apparent distress, AOx3 HEENT:  No carotid bruits, +2 carotid impulses, no scleral icterus CAR: RRR no murmurs, gallops, rubs, or thrills RES:  Clear to auscultation bilaterally ABD:  Soft, nontender, nondistended, positive bowel sounds x 4 VASC:  +2 radial pulses, +2 carotid pulses NEURO:  CN 2-12 grossly intact; motor and sensory grossly intact PSYCH:  No active depression or anxiety EXT:  No edema, ecchymosis, or cyanosis  Signed, Tina Temme K Reilley Valentine, MD  02/21/2024 4:33 PM    [facility] [address] [place], [place], [place]  [address] Phone: [phone]; Fax: [phone]   Note:  This document was prepared using Dragon voice recognition software and may include unintentional dictation errors.

## 2024-02-21 ENCOUNTER — Ambulatory Visit: Attending: Internal Medicine | Admitting: Internal Medicine

## 2024-02-21 ENCOUNTER — Encounter: Payer: Self-pay | Admitting: Internal Medicine

## 2024-02-21 ENCOUNTER — Telehealth: Payer: Self-pay

## 2024-02-21 VITALS — BP 100/60 | HR 56 | Ht 65.0 in | Wt 220.0 lb

## 2024-02-21 DIAGNOSIS — Z6836 Body mass index (BMI) 36.0-36.9, adult: Secondary | ICD-10-CM

## 2024-02-21 DIAGNOSIS — R0683 Snoring: Secondary | ICD-10-CM

## 2024-02-21 DIAGNOSIS — D6869 Other thrombophilia: Secondary | ICD-10-CM | POA: Diagnosis not present

## 2024-02-21 DIAGNOSIS — E1159 Type 2 diabetes mellitus with other circulatory complications: Secondary | ICD-10-CM

## 2024-02-21 DIAGNOSIS — I700 Atherosclerosis of aorta: Secondary | ICD-10-CM

## 2024-02-21 DIAGNOSIS — Z794 Long term (current) use of insulin: Secondary | ICD-10-CM

## 2024-02-21 DIAGNOSIS — E785 Hyperlipidemia, unspecified: Secondary | ICD-10-CM

## 2024-02-21 DIAGNOSIS — I480 Paroxysmal atrial fibrillation: Secondary | ICD-10-CM

## 2024-02-21 DIAGNOSIS — I152 Hypertension secondary to endocrine disorders: Secondary | ICD-10-CM

## 2024-02-21 DIAGNOSIS — E118 Type 2 diabetes mellitus with unspecified complications: Secondary | ICD-10-CM | POA: Diagnosis not present

## 2024-02-21 DIAGNOSIS — N184 Chronic kidney disease, stage 4 (severe): Secondary | ICD-10-CM

## 2024-02-21 DIAGNOSIS — E1169 Type 2 diabetes mellitus with other specified complication: Secondary | ICD-10-CM

## 2024-02-21 MED ORDER — METOPROLOL SUCCINATE ER 25 MG PO TB24: 12.5000 mg | ORAL_TABLET | Freq: Every day | ORAL | 2 refills | Status: AC

## 2024-02-21 NOTE — Telephone Encounter (Signed)
**Note De-Identified [name] Obfuscation** While at a office visit with Dr Wendel today the pt advised his nurse that she left a WatchPAT One-Home Sleep Test Device in medical records on the day we provided it to her on 11/15/23.  She stated that she s/w Medical Records and was advised that they returned the device to the Sleep Dept.  I could not find any notes in her chart concerning this and I could not find a returned device with the SN # of [id number]. I did provide her with another device as she was adamant that she s/w Medical Records and was assured that it was returned to us .  I did enter the new device in CloudPAT as the pt stated that she plans to do it tonight but I was unable to remove the last device that was left in medical records.  I will discuss with my supervisor.

## 2024-02-21 NOTE — Patient Instructions (Signed)
 Medication Instructions:  Please discontinue your Lopressor  and start Metoprolol  Succinate 25 mg - take 1/2 tablet at bedtime. Continue all other medications as listed.  *If you need a refill on your cardiac medications before your next appointment, please call your pharmacy*  Lab Work: Please have blood work today.  If you have labs (blood work) drawn today and your tests are completely normal, you will receive your results only by: MyChart Message (if you have MyChart) OR A paper copy in the mail If you have any lab test that is abnormal or we need to change your treatment, we will call you to review the results.  Testing/Procedures: You have been referred to sleep study and will be contacted with further instructions.  Follow-Up: At [facility], you and your health needs are our priority.  As part of our continuing mission to provide you with exceptional heart care, our providers are all part of one team.  This team includes your primary Cardiologist (physician) and Advanced Practice Providers or APPs (Physician Assistants and Nurse Practitioners) who all work together to provide you with the care you need, when you need it.  Your next appointment:   6 month(s)  Provider:   Arun K Thukkani, MD    We recommend signing up for the patient portal called MyChart.  Sign up information is provided on this After Visit Summary.  MyChart is used to connect with patients for Virtual Visits (Telemedicine).  Patients are able to view lab/test results, encounter notes, upcoming appointments, etc.  Non-urgent messages can be sent to your provider as well.   To learn more about what you can do with MyChart, go to [email or web address].

## 2024-02-22 ENCOUNTER — Telehealth (HOSPITAL_BASED_OUTPATIENT_CLINIC_OR_DEPARTMENT_OTHER): Payer: Self-pay | Admitting: *Deleted

## 2024-02-22 NOTE — Telephone Encounter (Signed)
   Pre-operative Risk Assessment    Patient Name: [name]  DOB: [birth year] MRN: [id number]   Date of last office visit: 02/21/2024 Date of next office visit: None   Request for Surgical Clearance    Procedure:  Left Total Knee Arthroplasty  Date of Surgery:  Clearance 05/12/24                                Surgeon: Dr. Dempsey Baars Surgeon's Group or Practice Name:  [facility] Phone number:  [phone] Fax number:  [phone]   Type of Clearance Requested:   - Medical  - Pharmacy:  Hold Apixaban  (Eliquis ) not indicated.    Type of Anesthesia:  Choice   Additional requests/questions:  Sent to Dr. Dede who seen patient on 02/21/24  Signed, Edsel Grayce Sanders   02/22/2024, 3:43 PM

## 2024-02-23 ENCOUNTER — Ambulatory Visit: Payer: Self-pay | Admitting: Internal Medicine

## 2024-02-23 LAB — HEPATIC FUNCTION PANEL
ALT: 24 IU/L (ref 0–32)
AST: 24 IU/L (ref 0–40)
Albumin: 3.9 g/dL (ref 3.8–4.8)
Alkaline Phosphatase: 137 IU/L — ABNORMAL HIGH (ref 49–135)
Bilirubin Total: 0.3 mg/dL (ref 0.0–1.2)
Bilirubin, Direct: 0.12 mg/dL (ref 0.00–0.40)
Total Protein: 6.6 g/dL (ref 6.0–8.5)

## 2024-02-23 LAB — LIPOPROTEIN A (LPA): Lipoprotein (a): 24.3 nmol/L (ref <75.0)

## 2024-02-25 NOTE — Telephone Encounter (Signed)
   Primary Cardiologist: Arun K Thukkani, MD  Chart reviewed as part of pre-operative protocol coverage. Given past medical history and time since last visit, based on ACC/AHA guidelines, [name] would be at acceptable risk for the planned procedure without further cardiovascular testing.   Patient should contact our office if she is having new symptoms that are concerning from a cardiac perspective to arrange a follow-up appointment.   Per office protocol, she may hold Eliquis  for 3 days prior to procedure and should resume as soon as hemodynamically stable postoperatively.   I will route this recommendation to the requesting party via Epic fax function and remove from pre-op pool.  Please call with questions.  [name], NP-C  02/25/2024, 7:07 AM [address], [address] [place], [place] [address] Office [phone] Fax [phone]

## 2024-02-29 ENCOUNTER — Other Ambulatory Visit (HOSPITAL_COMMUNITY): Payer: Self-pay | Admitting: Internal Medicine

## 2024-02-29 ENCOUNTER — Telehealth (HOSPITAL_COMMUNITY): Payer: Self-pay

## 2024-02-29 DIAGNOSIS — M810 Age-related osteoporosis without current pathological fracture: Secondary | ICD-10-CM | POA: Insufficient documentation

## 2024-02-29 NOTE — Telephone Encounter (Signed)
 Auth Submission: APPROVED Site of care: Site of care: [facility] Payer: [facility] Medicare Medication & CPT/J Code(s) submitted: Reclast (Zolendronic acid) [id number] Diagnosis Code: M81.0 Route of submission (phone, fax, portal): portal Phone # Fax # Auth type: Buy/Bill PB Units/visits requested: 5mg  x 1 dose Reference number: [id number] Approval from: 02/29/24 to 02/28/25

## 2024-04-30 NOTE — H&P (Signed)
 " TOTAL KNEE ADMISSION H&P  Patient is being admitted for left total knee arthroplasty.  Subjective:  Chief Complaint: Left knee pain.  HPI: [name], 79 y.o. female has a history of pain and functional disability in the left knee due to arthritis and has failed non-surgical conservative treatments for greater than 12 weeks to include NSAID's and/or analgesics, corticosteriod injections, viscosupplementation injections, use of assistive devices, and activity modification. Onset of symptoms was gradual, starting several years ago with gradually worsening course since that time. The patient noted no past surgery on the left knee.  Patient currently rates pain in the left knee at 8 out of 10 with activity. Patient has night pain, worsening of pain with activity and weight bearing, pain that interferes with activities of daily living, crepitus, and joint swelling. Patient has evidence of Radiographs of both knees from October 1 demonstrate severe bone-on-bone arthritis in the medial and patellofemoral compartments of both knees, with varus deformity and tibial subluxation. The left knee shows slightly worse changes compared to the right. by imaging studies.There is no active infection.  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   Obstructive sleep apnea 07/10/2007   Essential hypertension 07/10/2007   Mitral valve disorder 07/10/2007   CHRONIC FATIGUE SYNDROME 07/10/2007    Past Medical History:  Diagnosis Date   ADHD (attention deficit hyperactivity disorder)     Anticoagulant long-term use    eliquis --- managed by cardiology   Chronic constipation    Chronic fatigue syndrome    followed by dr carlin lapp in [place] ([facility] clinic)   CKD (chronic kidney disease), stage III (HCC)    followed by nephrologist--- dr katheryn foster  [facility])   Complication of anesthesia 1975   projectile vomitting   DDD (degenerative disc disease), cervical    Diabetic neuropathy (HCC)    Dyslipidemia    Esophageal dysmotility 2023   barium swallow done on 09/07/21 that showed moderate esophageal dysmotility, mild to moderate persistent narrowing of the distal esophagus.   Esophageal stricture    followed by [facility] (dr saintclair);   hx balloon dilatation 08/ 2014;  takes nitro as needed for esophageal pain   Fibromyalgia    Full dentures    Gait instability    GERD (gastroesophageal reflux disease)    History of colon polyps    Insulin  dependent type 2 diabetes mellitus (HCC)    followed by pcp   (10-19-2021  pt stated checks blood sugar QID,  fasting average 70-140)   Mild obstructive sleep apnea 10/18/2012   sleep study on 10/05/21 revealed mild obstructive sleep apnea. Pt follows with Dr. Carolynne Allan, Pulmonology, LOV 08/10/21 in Epic.   Osteoporosis    PAF (paroxysmal atrial fibrillation) [facility])    cardiologist--- dr h. claudene;   hx DCCV 04/ 2013;   PONV (postoperative nausea and vomiting)    Rosacea    Secondary hyperparathyroidism of renal origin    Vitamin D deficiency 10/18/2012    Past Surgical History:  Procedure Laterality Date   BALLOON DILATION  N/A 12/03/2012   Procedure: BALLOON DILATION;  Surgeon: Gladis MARLA Louder, MD;  Location: [facility];  Service: Endoscopy;  Laterality: N/A;   BIOPSY  02/07/2022   Procedure: BIOPSY;  Surgeon: Saintclair Jasper, MD;  Location: [facility];  Service: Gastroenterology;;   BOTOX  INJECTION N/A 02/07/2022   Procedure: BOTOX  INJECTION;  Surgeon: Saintclair Jasper, MD;  Location: [facility];  Service:  Gastroenterology;  Laterality: N/A;   BREAST EXCISIONAL BIOPSY Left    1990s;   benign   CARDIAC CATHETERIZATION  1989   CARDIOVERSION  07/28/2011   Procedure: CARDIOVERSION;  Surgeon: Victory LELON Claudene DOUGLAS, MD;  Location: [facility] OR;  Service: Cardiovascular;  Laterality: N/A;   CATARACT EXTRACTION W/ INTRAOCULAR LENS IMPLANT Bilateral    left 10/ 2013;  right 12/ 2021   COLONOSCOPY WITH PROPOFOL  N/A 03/29/2015   Procedure: COLONOSCOPY WITH PROPOFOL ;  Surgeon: Gladis MARLA Louder, MD;  Location: [facility];  Service: Endoscopy;  Laterality: N/A;   COLONOSCOPY WITH PROPOFOL  N/A 02/07/2022   Procedure: COLONOSCOPY WITH PROPOFOL ;  Surgeon: Saintclair Jasper, MD;  Location: [facility];  Service: Gastroenterology;  Laterality: N/A;   ESOPHAGEAL MANOMETRY N/A 05/19/2013   Procedure: ESOPHAGEAL MANOMETRY (EM);  Surgeon: Gladis MARLA Louder, MD;  Location: [facility];  Service: Endoscopy;  Laterality: N/A;   ESOPHAGOGASTRODUODENOSCOPY (EGD) WITH PROPOFOL  N/A 12/03/2012   Procedure: ESOPHAGOGASTRODUODENOSCOPY (EGD) WITH Dilation & w/PROPOFOL ;  Surgeon: Gladis MARLA Louder, MD;  Location: [facility];  Service: Endoscopy;  Laterality: N/A;   ESOPHAGOGASTRODUODENOSCOPY (EGD) WITH PROPOFOL  N/A 02/07/2022   Procedure: ESOPHAGOGASTRODUODENOSCOPY (EGD) WITH PROPOFOL ;  Surgeon: Saintclair Jasper, MD;  Location: [facility];  Service: Gastroenterology;  Laterality: N/A;   EXCISION MASS LOWER EXTREMETIES Left 10/21/2021   Procedure: EXCISION OF LEFT THIGH MASS;  Surgeon: Signe Mitzie LABOR, MD;  Location: [facility];  Service: General;  Laterality: Left;   LAPAROSCOPIC CHOLECYSTECTOMY  07/2014   @[facility]    POLYPECTOMY  02/07/2022   Procedure: POLYPECTOMY;  Surgeon: Saintclair Jasper, MD;  Location: [facility];  Service: Gastroenterology;;   RETINAL DETACHMENT SURGERY Left 11/2016   THYROIDECTOMY Right 1987   per pt benign   TOTAL ABDOMINAL HYSTERECTOMY W/ BILATERAL SALPINGOOPHORECTOMY  1995   TUBAL LIGATION  1983     Prior to Admission medications  Medication Sig Start Date End Date Taking? Authorizing Provider  acetaminophen  (TYLENOL ) 500 MG tablet Take 500 mg by mouth every 6 (six) hours as needed for mild pain or fever.     [provider]  amiodarone  (PACERONE ) 200 MG tablet Take 0.5 tablets (100 mg total) by mouth daily. 12/17/23   Lucien Orren SAILOR, PA-C  amphetamine-dextroamphetamine (ADDERALL) 20 MG tablet Take 20 mg by mouth 3 (three) times daily as needed (ADD).    [provider]  atorvastatin (LIPITOR) 80 MG tablet Take 80 mg by mouth at bedtime.    [provider]  BD PEN NEEDLE NANO U/F 32G X 4 MM MISC  03/22/13   [provider]  buPROPion (WELLBUTRIN XL) 300 MG 24 hr tablet Take 300 mg by mouth every morning. 09/19/19   [provider]  Cholecalciferol (VITAMIN D) 125 MCG (5000 UT) CAPS Take 5,000 Units by mouth daily.     [provider]  cyanocobalamin (VITAMIN B12) 1000 MCG/ML injection Inject into the muscle. 07/18/22   [provider]  DULoxetine (CYMBALTA) 30 MG capsule Take 30 mg by mouth daily.    [provider]  ELIQUIS  5 MG TABS tablet Take 1 tablet (5 mg  total) by mouth 2 (two) times daily. 12/27/23   Thukkani, Arun K, MD  esomeprazole  (NEXIUM ) 40 MG capsule TAKE 1 CAPSULE TWICE DAILY BEFORE MEALS Patient taking differently: Take 40 mg by mouth daily. 12/07/17   Nandigam, Kavitha V, MD  FARXIGA 10 MG TABS tablet Take 10 mg by mouth daily. 08/05/21   [provider]  fluticasone  (FLONASE ) 50 MCG/ACT nasal spray Place 1 spray into both nostrils daily. Patient taking differently: Place 1 spray into both nostrils daily as needed. 08/10/21   Shellia Oh, MD  FREESTYLE LITE test strip 4 (four) times daily. 09/26/19   [provider]  furosemide  (LASIX ) 20 MG tablet Take 0.5 tablets (10 mg total) by mouth daily as needed for fluid. 12/01/21   Claudene Victory ORN, MD  hydrOXYzine (ATARAX) 25 MG tablet Take by  mouth. Patient taking differently: Take 50 mg by mouth daily at 6 (six) AM. 08/08/22   [provider]  insulin  glargine (LANTUS) 100 UNIT/ML injection Inject 40 Units into the skin daily. Patient taking differently: Inject 50 Units into the skin daily.    [provider]  insulin  lispro (HUMALOG) 100 UNIT/ML injection Inject 10-25 Units into the skin 3 (three) times daily. Inject 10-25 units into the skin 3 times daily  per SS Patient taking differently: Inject 25 Units into the skin 3 (three) times daily with meals. Inject 10-25 units into the skin 3 times daily  per SS    [provider]  ketoconazole (NIZORAL) 2 % cream Apply 1 Application topically daily as needed for irritation.    [provider]  Melatonin 10 MG TBDP Take 10 mg by mouth at bedtime as needed (sleep).    [provider]  metoprolol  succinate (TOPROL -XL) 25 MG 24 hr tablet Take 0.5 tablets (12.5 mg total) by mouth daily. 02/21/24   Thukkani, Arun K, MD  metroNIDAZOLE (METROCREAM) 0.75 % cream Apply 1 Application topically daily as needed (rosacea).    [provider]  MOVANTIK  12.5 MG TABS tablet Take 12.5 mg by mouth daily as needed (opioid induced constipation). 07/19/17   [provider]  nitroGLYCERIN (NITROSTAT) 0.4 MG SL tablet Place 0.4 mg under the tongue every 5 (five) minutes as needed for chest pain.    [provider]  oxyCODONE  (OXY IR/ROXICODONE ) 5 MG immediate release tablet Take 5 mg by mouth every 6 (six) hours as needed (for breakthrough pain).    [provider]  OXYCONTIN  30 MG 12 hr tablet Take 1 tablet by mouth 3 (three) times daily. 09/06/23   [provider]  pregabalin (LYRICA) 150 MG capsule Take 150 mg by mouth 2 (two) times daily.    [provider]  Semaglutide,0.25 or 0.5MG /DOS, (OZEMPIC, 0.25 OR 0.5 MG/DOSE,) 2 MG/1.5ML SOPN Inject into the skin.    [provider]  sertraline (ZOLOFT) 100 MG  tablet Take 200 mg by mouth daily. 06/11/21   [provider]  tetrahydrozoline 0.05 % ophthalmic solution     [provider]  tiZANidine (ZANAFLEX) 2 MG tablet Take 2 mg by mouth 3 (three) times daily as needed for muscle spasms. 09/26/19   [provider]  estrogens, conjugated, (PREMARIN) 0.9 MG tablet Take 0.9 mg by mouth daily.  07/18/11  [provider]  glimepiride (AMARYL) 4 MG tablet Take 4 mg by mouth 2 (two) times daily.   07/18/11  [provider]  metFORMIN (GLUCOPHAGE) 500 MG tablet Take 1,000 mg by mouth 2 (two) times  daily with a meal.    07/18/11  [provider]    Allergies[1]  Social History   Socioeconomic History   Marital status: Married    Spouse name: Not on file   Number of children: Not on file   Years of education: Not on file   Highest education level: Not on file  Occupational History   Not on file  Tobacco Use   Smoking status: Former    Current packs/day: 0.00    Types: Cigarettes    Start date: 109    Quit date: 64    Years since quitting: 54.0   Smokeless tobacco: Never  Vaping Use   Vaping status: Never Used  Substance and Sexual Activity   Alcohol  use: No   Drug use: Never   Sexual activity: Yes  Other Topics Concern   Not on file  Social History Narrative   Not on file   Social Drivers of Health   Tobacco Use: Medium Risk (02/21/2024)   Patient History    Smoking Tobacco Use: Former    Smokeless Tobacco Use: Never    Passive Exposure: Not on Actuary Strain: Low Risk  (12/18/2023)   [facility]   Overall Financial Resource Strain (CARDIA)    Difficulty of Paying Living Expenses: Not very hard  Food Insecurity: No Food Insecurity (12/18/2023)   Received from [facility]   Epic    Within the past 12 months, you worried that your food would run out before you got the money to buy more.: Never true    Within the past  12 months, the food you bought just didn't last and you didn't have money to get more.: Never true  Transportation Needs: No Transportation Needs (12/18/2023)   Received [facility] - Transportation    In the past 12 months, has lack of transportation kept you from medical appointments or from getting medications?: No    Lack of Transportation (Non-Medical): No  Physical Activity: Not on file  Stress: Not on file  Social Connections: Not on file  Intimate Partner Violence: Not on file  Depression (EYV7-0): Not on file  Alcohol  Screen: Not on file  Housing: Unknown (12/18/2023)   Received from [facility]   Epic    In the last 12 months, was there a time when you were not able to pay the mortgage or rent on time?: No    Number of Times Moved in the Last Year: Not on file    At any time in the past 12 months, were you homeless or living in a shelter (including now)?: No  Utilities: Not At Risk (12/18/2023)   Received from [facility]   Epic    In the past 12 months has the electric, gas, oil, or water company threatened to shut off services in your home?: No  Health Literacy: Not on file    Tobacco Use: Medium Risk (02/21/2024)   Patient History    Smoking Tobacco Use: Former    Smokeless Tobacco Use: Never    Passive Exposure: Not on file   Social History   Substance and Sexual Activity  Alcohol  Use No    Family History  Problem Relation Age of Onset   Hypertension Mother    Hyperthyroidism Mother    Diabetes type II Father    Heart disease Father    Atrial fibrillation Father  Bronchitis Father    Kidney cancer Sister    Breast cancer Maternal Aunt     Review of Systems  Constitutional:  Negative for chills and fever.  Respiratory:  Negative for cough.   Cardiovascular:  Negative for chest pain.  Gastrointestinal:  Negative for abdominal pain.  Genitourinary:  Negative for dysuria.   Musculoskeletal:  Positive for joint pain.    Objective:  Physical Exam: - Constitutional: Alert and oriented, no apparent distress. Musculoskeletal: - Left knee: No effusion. Range of motion 10-120 degrees. Crepitus on range of motion. Tender medially with slight lateral tenderness. No instability. - Right knee: No effusion. Range of motion 10-120 degrees. Moderate crepitus on range of motion. Tender medially with slight lateral tenderness. No instability. - Gait: Significantly antalgic with a hunched-over gait.  Imaging Review Radiographs of both knees from October 1 demonstrate severe bone-on-bone arthritis in the medial and patellofemoral compartments of both knees, with varus deformity and tibial subluxation. The left knee shows slightly worse changes compared to the right.  Assessment/Plan:  End stage arthritis, left knee   The patient history, physical examination, clinical judgment of the provider and imaging studies are consistent with end stage degenerative joint disease of the left knee and total knee arthroplasty is deemed medically necessary. The treatment options including medical management, injection therapy arthroscopy and arthroplasty were discussed at length. The risks and benefits of total knee arthroplasty were presented and reviewed. The risks due to aseptic loosening, infection, stiffness, patella tracking problems, thromboembolic complications and other imponderables were discussed. The patient acknowledged the explanation, agreed to proceed with the plan and consent was signed. Patient is being admitted for inpatient treatment for surgery, pain control, PT, OT, prophylactic antibiotics, VTE prophylaxis, progressive ambulation and ADLs and discharge planning. The patient is planning to be discharged Home.    Patient's anticipated LOS is less than 2 midnights, meeting these requirements: - Lives within 1 hour of care - Has a competent adult at home to recover with  post-op recover - NO history of  - Heart failure  - Heart attack  - Stroke  - DVT/VTE  - Renal failure  - Anemia  - Advanced Liver disease    Therapy Plans: requesting HHPT for first week or two due to husbands limitations, OP [place] [facility] physical therapy  Disposition: Home with Husband  Planned DVT Prophylaxis: Eliquis  5mg  BID DME Needed: none PCP: Dorn Sauers, MD (clearance pending* Cardiology: Lurena Red, MD (clearance received)  Endocrinology: Juliane Alexander, MD (clearance pending*  TXA: IV  Allergies: Dabigatran (headache), Iodinated contrast (rash, swelling), Iohexol (swelling, rash), Latex (rash), Liraglutide (headache), Meperidine (N/V), Morphine (hyperactivity), Penicillins (facial swelling), Pradaxa (headache), Rosiglitizaone (edema), Shellfish (hives, swelling), Victoza (headaches)  Metal Allergies: none Anesthesia Concerns: N/V with Anesthesia BMI: 37.1 Last HgbA1c: 7.2% in October Pharmacy: [facility] pharmacy to bring on DC Pain regimen: Hydromorphone , Methocarbamol (takes oxycodone  chronically, post-op hydromorphone  then transition back to oxycodone  during recovery; Takes tizanidine chronically would like to try methocarbamol post-op and adjust if needed)   Other: - Hold eliquis  for 3 days prior to sx per cardio  - Husband has charrott-marie-tooth disease - requesting HHPT for first week or two due to husbands limitations, then OP [facility] hospital physical therapy. - Okay with [facility] for HHPT for first two weeks then transition to OPPT  -on chronic Oxycodone  15mg  2x daily  - Patient was instructed on what medications to stop prior to surgery. - Follow-up visit in 2 weeks with Dr. Melodi - Begin  physical therapy following surgery - Pre-operative lab work as pre-surgical testing - Prescriptions will be provided in hospital at time of discharge  Waddell Sor, PA-C Orthopedic Surgery [facility] [place]      [1]  Allergies Allergen Reactions    Iohexol Other (See Comments)     Desc: EYES & LIPS SWELLING, SOB DURING A MYELOGRAM '88, DECADRON  GIVEN/ PRE MEDS REQUIRED/A.C., Onset Date: [id number]    Morphine And Codeine Other (See Comments)    Hyperactivity / climbs the wall's   Penicillins Hives and Swelling    Has patient had a PCN reaction causing immediate rash, facial/tongue/throat swelling, SOB or lightheadedness with hypotension: Yes Has patient had a PCN reaction causing severe rash involving mucus membranes or skin necrosis: No Has patient had a PCN reaction that required hospitalization No Has patient had a PCN reaction occurring within the last 10 years: No If all of the above answers are NO, then may proceed with Cephalosporin use.    Shellfish Allergy Hives and Swelling    Hard shellfish   Demerol Nausea And Vomiting    Can take with Phenergan   Avandia [Rosiglitazone] Swelling    Other reaction(s): Weight gain and edema   Latex Rash    Contact dermatitis   Pradaxa [Dabigatran Etexilate Mesylate] Other (See Comments)    Headaches    Victoza [Liraglutide] Other (See Comments)    headaches   "

## 2024-05-02 ENCOUNTER — Ambulatory Visit: Admitting: Podiatry

## 2024-05-06 ENCOUNTER — Encounter (HOSPITAL_COMMUNITY): Payer: Self-pay | Admitting: Medical

## 2024-05-07 NOTE — Patient Instructions (Addendum)
 SURGICAL WAITING ROOM VISITATION Patients having surgery or a procedure may have no more than 2 support people in the waiting area - these visitors may rotate.    Children under the age of 30 will not be allowed to visit due to the increase in respiratory illness  Children under the age of 39 must have an adult with them who is not the patient.  If the patient needs to stay at the hospital during part of their recovery, the visitor guidelines for inpatient rooms apply. Pre-op nurse will coordinate an appropriate time for 1 support person to accompany patient in pre-op.  This support person may not rotate.    Please refer to the [facility] website for the visitor guidelines for Inpatients (after your surgery is over and you are in a regular room).       Your procedure is scheduled on: 05-12-24   Report to [facility] Main Entrance    Report to admitting at 11:15 AM   Call this number if you have problems the morning of surgery [phone]   Do not eat food :After Midnight.   After Midnight you may have the following liquids until 10:45 AM DAY OF SURGERY  Water Non-Citrus Juices (without pulp, NO RED-Apple, White grape, White cranberry) Black Coffee (NO MILK/CREAM OR CREAMERS, sugar ok)  Clear Tea (NO MILK/CREAM OR CREAMERS, sugar ok) regular and decaf                             Plain Jell-O (NO RED)                                           Fruit ices (not with fruit pulp, NO RED)                                     Popsicles (NO RED)                                                               Sports drinks like Gatorade (NO RED)                   The day of surgery:  Drink ONE (1) Pre-Surgery G2 by 10:45 AM the morning of surgery. Drink in one sitting. Do not sip.  This drink was given to you during your hospital  pre-op appointment visit. Nothing else to drink after completing the Pre-Surgery G2.          If you have questions, please contact your surgeons  office.   FOLLOW  ANY ADDITIONAL PRE OP INSTRUCTIONS YOU RECEIVED FROM YOUR SURGEON'S OFFICE!!!     Oral Hygiene is also important to reduce your risk of infection.                                    Remember - BRUSH YOUR TEETH THE MORNING OF SURGERY WITH YOUR REGULAR TOOTHPASTE   Do NOT smoke after Midnight   Take  these medicines the morning of surgery with A SIP OF WATER:    Amiodarone   Metoprolol    Bupropion  Movantik    Duloxetine  Pregabalin   Esomeprazole  Sertraline   Hydroxyzine   If needed Tylenol , Oxycontin    Okay to use nasal spray and eyedrops  Stop all vitamins and herbal supplements 7 days before surgery  Hold Eliquis  - hold 3 days prior to surgery (do not take after 05-08-24)  How to Manage Your Diabetes Before and After Surgery  Why is it important to control my blood sugar before and after surgery? Improving blood sugar levels before and after surgery helps healing and can limit problems. A way of improving blood sugar control is eating a healthy diet by:  Eating less sugar and carbohydrates  Increasing activity/exercise  Talking with your doctor about reaching your blood sugar goals High blood sugars (greater than 180 mg/dL) can raise your risk of infections and slow your recovery, so you will need to focus on controlling your diabetes during the weeks before surgery. Make sure that the doctor who takes care of your diabetes knows about your planned surgery including the date and location.  How do I manage my blood sugar before surgery? Check your blood sugar at least 4 times a day, starting 2 days before surgery, to make sure that the level is not too high or low. Check your blood sugar the morning of your surgery when you wake up and every 2 hours until you get to the Short Stay unit. If your blood sugar is less than 70 mg/dL, you will need to treat for low blood sugar: Do not take insulin . Treat a low blood sugar (less than 70 mg/dL) with  cup of clear  juice (cranberry or apple), 4 glucose tablets, OR glucose gel. Recheck blood sugar in 15 minutes after treatment (to make sure it is greater than 70 mg/dL). If your blood sugar is not greater than 70 mg/dL on recheck, call [phone] for further instructions. Report your blood sugar to the short stay nurse when you get to Short Stay.  If you are admitted to the hospital after surgery: Your blood sugar will be checked by the staff and you will probably be given insulin  after surgery (instead of oral diabetes medicines) to make sure you have good blood sugar levels. The goal for blood sugar control after surgery is 80-180 mg/dL.   WHAT DO I DO ABOUT MY DIABETES MEDICATION?  Do not take oral diabetes medicines (pills) the morning of surgery.        Hold Ozempic 7 days before surgery (do not take after 05-04-24)        Hold Farxiga 3 days before surgery (do not take after 05-08-24)  THE NIGHT BEFORE SURGERY, take 25 units of  Lantus insulin .   THE MORNING OF SURGERY, If your CBG is greater than 220 mg/dL, you may take  of your Humalog sliding scale  (correction) dose of insulin ..  DO NOT TAKE THE FOLLOWING 7 DAYS PRIOR TO SURGERY: Ozempic, Wegovy, Rybelsus (Semaglutide), Byetta (exenatide), Bydureon (exenatide ER), Victoza, Saxenda (liraglutide), or Trulicity (dulaglutide) Mounjaro (Tirzepatide) Adlyxin (Lixisenatide), Polyethylene Glycol Loxenatide.  Reviewed and Endorsed by [facility] Patient Education Committee, August 2015  Bring CPAP mask and tubing day of surgery.                              You may not have any metal on your body  including hair pins, jewelry, and body piercing             Do not wear make-up, lotions, powders, perfumes, or deodorant  Do not wear nail polish including gel and S&S, artificial/acrylic nails, or any other type of covering on natural nails including finger and toenails. If you have artificial nails, gel coating, etc. that needs to be removed by a  nail salon please have this removed prior to surgery or surgery may need to be canceled/ delayed if the surgeon/ anesthesia feels like they are unable to be safely monitored.   Do not shave  48 hours prior to surgery.    Do not bring valuables to the hospital. [facility] IS NOT RESPONSIBLE   FOR VALUABLES.   Contacts, dentures or bridgework may not be worn into surgery.   Bring small overnight bag day of surgery.   DO NOT BRING YOUR HOME MEDICATIONS TO THE HOSPITAL. PHARMACY WILL DISPENSE MEDICATIONS LISTED ON YOUR MEDICATION LIST TO YOU DURING YOUR ADMISSION IN THE HOSPITAL!     Special Instructions: Bring a copy of your healthcare power of attorney and living will documents the day of surgery if you haven't scanned them before.              Please read over the following fact sheets you were given: IF YOU HAVE QUESTIONS ABOUT YOUR PRE-OP INSTRUCTIONS PLEASE CALL [phone] [facility]  If you received a COVID test during your pre-op visit  it is requested that you wear a mask when out in public, stay away from anyone that may not be feeling well and notify your surgeon if you develop symptoms. If you test positive for Covid or have been in contact with anyone that has tested positive in the last 10 days please notify you surgeon.   Pre-operative 4 CHG Bath Instructions  DYNA-Hex 4 Chlorhexidine  Gluconate 4% Solution Antiseptic 4 fl. oz   You can play a key role in reducing the risk of infection after surgery. Your skin needs to be as free of germs as possible. You can reduce the number of germs on your skin by washing with CHG (chlorhexidine  gluconate) soap before surgery. CHG is an antiseptic soap that kills germs and continues to kill germs even after washing.   DO NOT use if you have an allergy to chlorhexidine /CHG or antibacterial soaps. If your skin becomes reddened or irritated, stop using the CHG and notify one of our RNs at   Please shower with the CHG soap starting 4 days before  surgery using the following schedule:     Please keep in mind the following:  DO NOT shave, including legs and underarms, starting the day of your first shower.   You may shave your face at any point before/day of surgery.  Place clean sheets on your bed the day you start using CHG soap. Use a clean washcloth (not used since being washed) for each shower. DO NOT sleep with pets once you start using the CHG.  CHG Shower Instructions:  If you choose to wash your hair and private area, wash first with your normal shampoo/soap.  After you use shampoo/soap, rinse your hair and body thoroughly to remove shampoo/soap residue.  Turn the water OFF and apply about 3 tablespoons (45 ml) of CHG soap to a CLEAN washcloth.  Apply CHG soap ONLY FROM YOUR NECK DOWN TO YOUR TOES (washing for 3-5 minutes)  DO NOT use CHG soap on face, private areas, open wounds, or  sores.  Pay special attention to the area where your surgery is being performed.  If you are having back surgery, having someone wash your back for you may be helpful. Wait 2 minutes after CHG soap is applied, then you may rinse off the CHG soap.  Pat dry with a clean towel  Put on clean clothes/pajamas   If you choose to wear lotion, please use ONLY the CHG-compatible lotions on the back of this paper.     Additional instructions for the day of surgery:  Shower with regular soap the day of surgery DO NOT APPLY any lotions, deodorants, cologne, or perfumes.   Put on clean/comfortable clothes.  Brush your teeth.  Ask your nurse before applying any prescription medications to the skin.   CHG Compatible Lotions   Aveeno Moisturizing lotion  Cetaphil Moisturizing Cream  Cetaphil Moisturizing Lotion  Clairol Herbal Essence Moisturizing Lotion, Dry Skin  Clairol Herbal Essence Moisturizing Lotion, Extra Dry Skin  Clairol Herbal Essence Moisturizing Lotion, Normal Skin  Curel Age Defying Therapeutic Moisturizing Lotion with Alpha Hydroxy   Curel Extreme Care Body Lotion  Curel Soothing Hands Moisturizing Hand Lotion  Curel Therapeutic Moisturizing Cream, Fragrance-Free  Curel Therapeutic Moisturizing Lotion, Fragrance-Free  Curel Therapeutic Moisturizing Lotion, Original Formula  Eucerin Daily Replenishing Lotion  Eucerin Dry Skin Therapy Plus Alpha Hydroxy Crme  Eucerin Dry Skin Therapy Plus Alpha Hydroxy Lotion  Eucerin Original Crme  Eucerin Original Lotion  Eucerin Plus Crme Eucerin Plus Lotion  Eucerin TriLipid Replenishing Lotion  Keri Anti-Bacterial Hand Lotion  Keri Deep Conditioning Original Lotion Dry Skin Formula Softly Scented  Keri Deep Conditioning Original Lotion, Fragrance Free Sensitive Skin Formula  Keri Lotion Fast Absorbing Fragrance Free Sensitive Skin Formula  Keri Lotion Fast Absorbing Softly Scented Dry Skin Formula  Keri Original Lotion  Keri Skin Renewal Lotion Keri Silky Smooth Lotion  Keri Silky Smooth Sensitive Skin Lotion  Nivea Body Creamy Conditioning Oil  Nivea Body Extra Enriched Lotion  Nivea Body Original Lotion  Nivea Body Sheer Moisturizing Lotion Nivea Crme  Nivea Skin Firming Lotion  NutraDerm 30 Skin Lotion  NutraDerm Skin Lotion  NutraDerm Therapeutic Skin Cream  NutraDerm Therapeutic Skin Lotion  ProShield Protective Hand Cream  Provon moisturizing lotion   PATIENT SIGNATURE_________________________________  NURSE SIGNATURE__________________________________  ________________________________________________________________________    [name]  An incentive spirometer is a tool that can help keep your lungs clear and active. This tool measures how well you are filling your lungs with each breath. Taking long deep breaths may help reverse or decrease the chance of developing breathing (pulmonary) problems (especially infection) following: A long period of time when you are unable to move or be active. BEFORE THE PROCEDURE  If the spirometer includes an  indicator to show your best effort, your nurse or respiratory therapist will set it to a desired goal. If possible, sit up straight or lean slightly forward. Try not to slouch. Hold the incentive spirometer in an upright position. INSTRUCTIONS FOR USE  Sit on the edge of your bed if possible, or sit up as far as you can in bed or on a chair. Hold the incentive spirometer in an upright position. Breathe out normally. Place the mouthpiece in your mouth and seal your lips tightly around it. Breathe in slowly and as deeply as possible, raising the piston or the ball toward the top of the column. Hold your breath for 3-5 seconds or for as long as possible. Allow the piston or ball to fall to  the bottom of the column. Remove the mouthpiece from your mouth and breathe out normally. Rest for a few seconds and repeat Steps 1 through 7 at least 10 times every 1-2 hours when you are awake. Take your time and take a few normal breaths between deep breaths. The spirometer may include an indicator to show your best effort. Use the indicator as a goal to work toward during each repetition. After each set of 10 deep breaths, practice coughing to be sure your lungs are clear. If you have an incision (the cut made at the time of surgery), support your incision when coughing by placing a pillow or rolled up towels firmly against it. Once you are able to get out of bed, walk around indoors and cough well. You may stop using the incentive spirometer when instructed by your caregiver.  RISKS AND COMPLICATIONS Take your time so you do not get dizzy or light-headed. If you are in pain, you may need to take or ask for pain medication before doing incentive spirometry. It is harder to take a deep breath if you are having pain. AFTER USE Rest and breathe slowly and easily. It can be helpful to keep track of a log of your progress. Your caregiver can provide you with a simple table to help with this. If you are using the  spirometer at home, follow these instructions: SEEK MEDICAL CARE IF:  You are having difficultly using the spirometer. You have trouble using the spirometer as often as instructed. Your pain medication is not giving enough relief while using the spirometer. You develop fever of 100.5 F (38.1 C) or higher. SEEK IMMEDIATE MEDICAL CARE IF:  You cough up bloody sputum that had not been present before. You develop fever of 102 F (38.9 C) or greater. You develop worsening pain at or near the incision site. MAKE SURE YOU:  Understand these instructions. Will watch your condition. Will get help right away if you are not doing well or get worse. Document Released: 08/21/2006 Document Revised: 07/03/2011 Document Reviewed: 10/22/2006 [facility] Patient Information 2014 [place], [place].

## 2024-05-07 NOTE — Progress Notes (Addendum)
 Date of COVID positive in last 90 days:  PCP - Dorn Sauers, MD (notes and clearance in media) Cardiologist - Lurena Red, MD  Cardiac clearance in Epic dated 02-25-24  Chest x-ray - 12-04-23 Epic EKG - 11-12-23 Epic Stress Test - 12-04-23 Epic ECHO - 02-10-16 Epic Cardiac Cath - N/A Pacemaker/ICD device last checked:N/A Spinal Cord Stimulator:N/A  Bowel Prep - N/A  Sleep Study - Yes, +sleep apnea CPAP -   Fasting Blood Sugar - N/A Checks Blood Sugar _____ times a day  Ozempic Last dose of GLP1 agonist-  N/A GLP1 instructions:  Do not take after     Farxiga Last dose of SGLT-2 inhibitors-  N/A SGLT-2 instructions:  Do not take after     Blood Thinner Instructions: Eliquis  (hold x3 days) Last dose:   Time: Aspirin Instructions:N/A Last Dose:  Activity level:  Can go up a flight of stairs and perform activities of daily living without stopping and without symptoms of chest pain or shortness of breath.  Able to exercise without symptoms  Unable to go up a flight of stairs without symptoms of     Anesthesia review: Afib, HTN, OSA, DM, CKD  Patient denies shortness of breath, fever, cough and chest pain at PAT appointment  Patient verbalized understanding of instructions that were given to them at the PAT appointment. Patient was also instructed that they will need to review over the PAT instructions again at home before surgery.

## 2024-05-08 ENCOUNTER — Other Ambulatory Visit: Payer: Self-pay

## 2024-05-08 ENCOUNTER — Encounter (HOSPITAL_COMMUNITY)
Admission: RE | Admit: 2024-05-08 | Discharge: 2024-05-08 | Disposition: A | Discharge status: Home / Self Care | Source: Ambulatory Visit | Attending: Orthopedic Surgery | Admitting: Orthopedic Surgery

## 2024-05-08 ENCOUNTER — Encounter (HOSPITAL_COMMUNITY): Payer: Self-pay

## 2024-05-08 ENCOUNTER — Encounter (HOSPITAL_COMMUNITY)
Admission: RE | Admit: 2024-05-08 | Discharge: 2024-05-08 | Disposition: A | Discharge status: Home / Self Care | Source: Ambulatory Visit

## 2024-05-08 DIAGNOSIS — E119 Type 2 diabetes mellitus without complications: Secondary | ICD-10-CM

## 2024-05-08 DIAGNOSIS — Z01818 Encounter for other preprocedural examination: Secondary | ICD-10-CM

## 2024-05-08 HISTORY — DX: Anxiety disorder, unspecified: F41.9

## 2024-05-08 HISTORY — DX: Depression, unspecified: F32.A

## 2024-05-08 HISTORY — DX: Unspecified osteoarthritis, unspecified site: M19.90

## 2024-05-08 HISTORY — DX: Unspecified malignant neoplasm of skin, unspecified: C44.90

## 2024-05-08 HISTORY — DX: Unspecified atrial fibrillation: I48.91

## 2024-05-08 NOTE — Progress Notes (Signed)
 Date of COVID positive in last 90 days:  No   PCP - Dorn Sauers, MD (notes and clearance in media) Cardiologist - Lurena Red, MD   Cardiac clearance in Epic dated 02-25-24   Chest x-ray - 12-04-23 Epic EKG - 11-12-23 Epic Stress Test - 12-04-23 Epic ECHO - 02-10-16 Epic Cardiac Cath - Yes, several years ago Pacemaker/ICD device last checked:N/A Spinal Cord Stimulator:N/A   Bowel Prep - N/A   Sleep Study - Yes, +sleep apnea mild to moderate CPAP - No   Uses a Dexcom (does not have on currently) Fasting Blood Sugar - 85 to 135 Checks Blood Sugar - 3  times a day   Ozempic Last dose of GLP1 agonist-  04-26-24  GLP1 instructions:  Do not take after     Farxiga Last dose of SGLT-2 inhibitors-  05-08-24 SGLT-2 instructions:  Hold until after surgery   Blood Thinner Instructions: Eliquis  (hold x3 days) Last dose: 05-08-24     Time:  9:30 PM (pt is aware) Aspirin Instructions:N/A Last Dose:   Activity level:   Can go up a flight of stairs and perform activities of daily living without stopping and without symptoms of chest pain or shortness of breath.             Anesthesia review: Afib, HTN, OSA, DM, CKD   Patient denies shortness of breath, fever, cough and chest pain at PAT appointment   Patient verbalized understanding of instructions that were given to them at the PAT appointment. Patient was also instructed that they will need to review over the PAT instructions again at home before surgery.     Revision History

## 2024-05-08 NOTE — Progress Notes (Signed)
 Patient was a no show for her preop appointment, left message to return call.

## 2024-05-08 NOTE — Patient Instructions (Addendum)
 SURGICAL WAITING ROOM VISITATION Patients having surgery or a procedure may have no more than 2 support people in the waiting area - these visitors may rotate.    Children under the age of 75 will not be allowed to visit due to the increase in respiratory illness  Children under the age of 1 must have an adult with them who is not the patient.  If the patient needs to stay at the hospital during part of their recovery, the visitor guidelines for inpatient rooms apply. Pre-op nurse will coordinate an appropriate time for 1 support person to accompany patient in pre-op.  This support person may not rotate.    Please refer to the [facility] website for the visitor guidelines for Inpatients (after your surgery is over and you are in a regular room).       Your procedure is scheduled on: 05-12-24   Report to [facility] Main Entrance    Report to admitting at 11:15 AM   Call this number if you have problems the morning of surgery [phone]   Do not eat food :After Midnight.   After Midnight you may have the following liquids until 10:45 AM DAY OF SURGERY  Water Non-Citrus Juices (without pulp, NO RED-Apple, White grape, White cranberry) Black Coffee (NO MILK/CREAM OR CREAMERS, sugar ok)  Clear Tea (NO MILK/CREAM OR CREAMERS, sugar ok) regular and decaf                             Plain Jell-O (NO RED)                                           Fruit ices (not with fruit pulp, NO RED)                                     Popsicles (NO RED)                                                               Sports drinks like Gatorade (NO RED)                   The day of surgery:  Drink ONE (1) Pre-Surgery G2 by 10:45 AM the morning of surgery. Drink in one sitting. Do not sip.  This drink was given to you during your hospital  pre-op appointment visit. Nothing else to drink after completing the Pre-Surgery Clear Ensure or G2.          If you have questions, please contact  your surgeons office.   FOLLOW  ANY ADDITIONAL PRE OP INSTRUCTIONS YOU RECEIVED FROM YOUR SURGEON'S OFFICE!!!     Oral Hygiene is also important to reduce your risk of infection.                                    Remember - BRUSH YOUR TEETH THE MORNING OF SURGERY WITH YOUR REGULAR TOOTHPASTE   Do NOT smoke after Midnight  Take these medicines the morning of surgery with A SIP OF WATER:    Amiodarone    Bupropion   Hydroxyzine   Metoprolol    Movantik    Pregabalin   Sertraline   Flonase  nasal spray   If needed Tylenol , Oxycontin  or Hydrocodone  Stop all vitamins and herbal supplements 7 days before surgery  How to Manage Your Diabetes Before and After Surgery  Why is it important to control my blood sugar before and after surgery? Improving blood sugar levels before and after surgery helps healing and can limit problems. A way of improving blood sugar control is eating a healthy diet by:  Eating less sugar and carbohydrates  Increasing activity/exercise  Talking with your doctor about reaching your blood sugar goals High blood sugars (greater than 180 mg/dL) can raise your risk of infections and slow your recovery, so you will need to focus on controlling your diabetes during the weeks before surgery. Make sure that the doctor who takes care of your diabetes knows about your planned surgery including the date and location.  How do I manage my blood sugar before surgery? Check your blood sugar at least 4 times a day, starting 2 days before surgery, to make sure that the level is not too high or low. Check your blood sugar the morning of your surgery when you wake up and every 2 hours until you get to the Short Stay unit. If your blood sugar is less than 70 mg/dL, you will need to treat for low blood sugar: Do not take insulin . Treat a low blood sugar (less than 70 mg/dL) with  cup of clear juice (cranberry or apple), 4 glucose tablets, OR glucose gel. Recheck blood sugar in  15 minutes after treatment (to make sure it is greater than 70 mg/dL). If your blood sugar is not greater than 70 mg/dL on recheck, call [phone] for further instructions. Report your blood sugar to the short stay nurse when you get to Short Stay.  If you are admitted to the hospital after surgery: Your blood sugar will be checked by the staff and you will probably be given insulin  after surgery (instead of oral diabetes medicines) to make sure you have good blood sugar levels. The goal for blood sugar control after surgery is 80-180 mg/dL.   WHAT DO I DO ABOUT MY DIABETES MEDICATION?  Do not take oral diabetes medicines (pills) the morning of surgery.        Hold Ozempic 7 days before surgery(do not take after 05-04-24)       Hold Farxiga 3 days before surgery (do not take after 05-08-24)  The Morning of surgery take 25 units of Lantus insulin   THE MORNING OF SURGERY:  Take 1/2 of Humalog is CBG 220 or higher.  DO NOT TAKE THE FOLLOWING 7 DAYS PRIOR TO SURGERY: Ozempic, Wegovy, Rybelsus (Semaglutide), Byetta (exenatide), Bydureon (exenatide ER), Victoza, Saxenda (liraglutide), or Trulicity (dulaglutide) Mounjaro (Tirzepatide) Adlyxin (Lixisenatide), Polyethylene Glycol Loxenatide.     Reviewed and Endorsed by [facility] Patient Education Committee, August 2015                              You may not have any metal on your body including hair pins, jewelry, and body piercing             Do not wear make-up, lotions, powders, perfumes or deodorant  Do not wear nail polish including gel and S&S, artificial/acrylic  nails, or any other type of covering on natural nails including finger and toenails. If you have artificial nails, gel coating, etc. that needs to be removed by a nail salon please have this removed prior to surgery or surgery may need to be canceled/ delayed if the surgeon/ anesthesia feels like they are unable to be safely monitored.   Do not shave  48 hours prior to  surgery.    Do not bring valuables to the hospital. [facility] IS NOT RESPONSIBLE   FOR VALUABLES.   Contacts, dentures or bridgework may not be worn into surgery.   Bring small overnight bag day of surgery.   DO NOT BRING YOUR HOME MEDICATIONS TO THE HOSPITAL. PHARMACY WILL DISPENSE MEDICATIONS LISTED ON YOUR MEDICATION LIST TO YOU DURING YOUR ADMISSION IN THE HOSPITAL!                 Please read over the following fact sheets you were given: IF YOU HAVE QUESTIONS ABOUT YOUR PRE-OP INSTRUCTIONS PLEASE CALL [phone] Gwen  If you received a COVID test during your pre-op visit  it is requested that you wear a mask when out in public, stay away from anyone that may not be feeling well and notify your surgeon if you develop symptoms. If you test positive for Covid or have been in contact with anyone that has tested positive in the last 10 days please notify you surgeon.   Pre-operative 4 CHG Bath Instructions  DYNA-Hex 4 Chlorhexidine  Gluconate 4% Solution Antiseptic 4 fl. oz   You can play a key role in reducing the risk of infection after surgery. Your skin needs to be as free of germs as possible. You can reduce the number of germs on your skin by washing with CHG (chlorhexidine  gluconate) soap before surgery. CHG is an antiseptic soap that kills germs and continues to kill germs even after washing.   DO NOT use if you have an allergy to chlorhexidine /CHG or antibacterial soaps. If your skin becomes reddened or irritated, stop using the CHG and notify one of our RNs at   Please shower with the CHG soap starting 4 days before surgery using the following schedule:     Please keep in mind the following:  DO NOT shave, including legs and underarms, starting the day of your first shower.   You may shave your face at any point before/day of surgery.  Place clean sheets on your bed the day you start using CHG soap. Use a clean washcloth (not used since being washed) for each  shower. DO NOT sleep with pets once you start using the CHG.  CHG Shower Instructions:  If you choose to wash your hair and private area, wash first with your normal shampoo/soap.  After you use shampoo/soap, rinse your hair and body thoroughly to remove shampoo/soap residue.  Turn the water OFF and apply about 3 tablespoons (45 ml) of CHG soap to a CLEAN washcloth.  Apply CHG soap ONLY FROM YOUR NECK DOWN TO YOUR TOES (washing for 3-5 minutes)  DO NOT use CHG soap on face, private areas, open wounds, or sores.  Pay special attention to the area where your surgery is being performed.  If you are having back surgery, having someone wash your back for you may be helpful. Wait 2 minutes after CHG soap is applied, then you may rinse off the CHG soap.  Pat dry with a clean towel  Put on clean clothes/pajamas   If you choose to  wear lotion, please use ONLY the CHG-compatible lotions on the back of this paper.     Additional instructions for the day of surgery:  Shower with regular soap the day of surgery DO NOT APPLY any lotions, deodorants, cologne, or perfumes.   Put on clean/comfortable clothes.  Brush your teeth.  Ask your nurse before applying any prescription medications to the skin.   CHG Compatible Lotions   Aveeno Moisturizing lotion  Cetaphil Moisturizing Cream  Cetaphil Moisturizing Lotion  Clairol Herbal Essence Moisturizing Lotion, Dry Skin  Clairol Herbal Essence Moisturizing Lotion, Extra Dry Skin  Clairol Herbal Essence Moisturizing Lotion, Normal Skin  Curel Age Defying Therapeutic Moisturizing Lotion with Alpha Hydroxy  Curel Extreme Care Body Lotion  Curel Soothing Hands Moisturizing Hand Lotion  Curel Therapeutic Moisturizing Cream, Fragrance-Free  Curel Therapeutic Moisturizing Lotion, Fragrance-Free  Curel Therapeutic Moisturizing Lotion, Original Formula  Eucerin Daily Replenishing Lotion  Eucerin Dry Skin Therapy Plus Alpha Hydroxy Crme  Eucerin Dry Skin  Therapy Plus Alpha Hydroxy Lotion  Eucerin Original Crme  Eucerin Original Lotion  Eucerin Plus Crme Eucerin Plus Lotion  Eucerin TriLipid Replenishing Lotion  Keri Anti-Bacterial Hand Lotion  Keri Deep Conditioning Original Lotion Dry Skin Formula Softly Scented  Keri Deep Conditioning Original Lotion, Fragrance Free Sensitive Skin Formula  Keri Lotion Fast Absorbing Fragrance Free Sensitive Skin Formula  Keri Lotion Fast Absorbing Softly Scented Dry Skin Formula  Keri Original Lotion  Keri Skin Renewal Lotion Keri Silky Smooth Lotion  Keri Silky Smooth Sensitive Skin Lotion  Nivea Body Creamy Conditioning Oil  Nivea Body Extra Enriched Lotion  Nivea Body Original Lotion  Nivea Body Sheer Moisturizing Lotion Nivea Crme  Nivea Skin Firming Lotion  NutraDerm 30 Skin Lotion  NutraDerm Skin Lotion  NutraDerm Therapeutic Skin Cream  NutraDerm Therapeutic Skin Lotion  ProShield Protective Hand Cream  Provon moisturizing lotion   PATIENT SIGNATURE_________________________________  NURSE SIGNATURE__________________________________  ________________________________________________________________________    Nasario Exon  An incentive spirometer is a tool that can help keep your lungs clear and active. This tool measures how well you are filling your lungs with each breath. Taking long deep breaths may help reverse or decrease the chance of developing breathing (pulmonary) problems (especially infection) following: A long period of time when you are unable to move or be active. BEFORE THE PROCEDURE  If the spirometer includes an indicator to show your best effort, your nurse or respiratory therapist will set it to a desired goal. If possible, sit up straight or lean slightly forward. Try not to slouch. Hold the incentive spirometer in an upright position. INSTRUCTIONS FOR USE  Sit on the edge of your bed if possible, or sit up as far as you can in bed or on a chair. Hold  the incentive spirometer in an upright position. Breathe out normally. Place the mouthpiece in your mouth and seal your lips tightly around it. Breathe in slowly and as deeply as possible, raising the piston or the ball toward the top of the column. Hold your breath for 3-5 seconds or for as long as possible. Allow the piston or ball to fall to the bottom of the column. Remove the mouthpiece from your mouth and breathe out normally. Rest for a few seconds and repeat Steps 1 through 7 at least 10 times every 1-2 hours when you are awake. Take your time and take a few normal breaths between deep breaths. The spirometer may include an indicator to show your best effort. Use the indicator as a  goal to work toward during each repetition. After each set of 10 deep breaths, practice coughing to be sure your lungs are clear. If you have an incision (the cut made at the time of surgery), support your incision when coughing by placing a pillow or rolled up towels firmly against it. Once you are able to get out of bed, walk around indoors and cough well. You may stop using the incentive spirometer when instructed by your caregiver.  RISKS AND COMPLICATIONS Take your time so you do not get dizzy or light-headed. If you are in pain, you may need to take or ask for pain medication before doing incentive spirometry. It is harder to take a deep breath if you are having pain. AFTER USE Rest and breathe slowly and easily. It can be helpful to keep track of a log of your progress. Your caregiver can provide you with a simple table to help with this. If you are using the spirometer at home, follow these instructions: SEEK MEDICAL CARE IF:  You are having difficultly using the spirometer. You have trouble using the spirometer as often as instructed. Your pain medication is not giving enough relief while using the spirometer. You develop fever of 100.5 F (38.1 C) or higher. SEEK IMMEDIATE MEDICAL CARE IF:  You  cough up bloody sputum that had not been present before. You develop fever of 102 F (38.9 C) or greater. You develop worsening pain at or near the incision site. MAKE SURE YOU:  Understand these instructions. Will watch your condition. Will get help right away if you are not doing well or get worse. Document Released: 08/21/2006 Document Revised: 07/03/2011 Document Reviewed: 10/22/2006 [facility] Patient Information 2014 [place], [place].

## 2024-05-09 ENCOUNTER — Encounter (HOSPITAL_COMMUNITY): Admission: RE | Admit: 2024-05-09 | Source: Ambulatory Visit

## 2024-05-09 NOTE — Progress Notes (Signed)
 Patient phoned this morning stating that she has tested positive for Covid and is symptomatic.  Patient will notify surgeon's office.

## 2024-05-12 ENCOUNTER — Ambulatory Visit (HOSPITAL_COMMUNITY): Admission: RE | Admit: 2024-05-12 | Admitting: Orthopedic Surgery

## 2024-05-12 ENCOUNTER — Encounter (HOSPITAL_COMMUNITY): Admission: RE | Payer: Self-pay | Source: Home / Self Care

## 2024-05-12 DIAGNOSIS — E119 Type 2 diabetes mellitus without complications: Secondary | ICD-10-CM

## 2024-05-12 SURGERY — ARTHROPLASTY, KNEE, TOTAL
Anesthesia: Choice | Site: Knee | Laterality: Left

## 2024-05-30 ENCOUNTER — Telehealth (HOSPITAL_BASED_OUTPATIENT_CLINIC_OR_DEPARTMENT_OTHER): Payer: Self-pay | Admitting: *Deleted

## 2024-05-30 ENCOUNTER — Other Ambulatory Visit: Payer: Self-pay | Admitting: Internal Medicine

## 2024-05-30 DIAGNOSIS — Z01810 Encounter for preprocedural cardiovascular examination: Secondary | ICD-10-CM

## 2024-05-30 DIAGNOSIS — I480 Paroxysmal atrial fibrillation: Secondary | ICD-10-CM

## 2024-05-30 NOTE — Addendum Note (Signed)
 Addended by: GEROME IVAL SAILOR on: 05/30/2024 10:55 AM   Modules accepted: Orders

## 2024-05-30 NOTE — Telephone Encounter (Signed)
"  ° °  Pre-operative Risk Assessment    Patient Name: [name]  DOB: March 31, 1946 MRN: [id number]   Date of last office visit: 02/21/24 DR. THUKKANI Date of next office visit: NONE   Request for Surgical Clearance    Procedure:  COLONOSCOPY H/O ADENOMATOUS POLYPS  Date of Surgery:  Clearance 06/24/24                                Surgeon:  DR. [facility] Surgeon's Group or Practice Name:  [facility] Phone number:  [phone] Fax number:  [phone]   Type of Clearance Requested:   - Medical  - Pharmacy:  Hold Apixaban  (Eliquis ) x 2 DAYS PRIOR   Type of Anesthesia:  PROPOFOL     Additional requests/questions:    [name]   05/30/2024, 8:55 AM   "

## 2024-05-30 NOTE — Telephone Encounter (Signed)
 Lab orders have been placed. Attempted to contact the patient to inform her and received no answer. [id number]

## 2024-05-30 NOTE — Telephone Encounter (Signed)
 Please order a CBC and BMET for pharmacy to make recommendations on Eliquis  hold.  She will need a VV once pharmacy has weighed in.    Thank you! KL

## 2024-08-01 ENCOUNTER — Ambulatory Visit: Admitting: Podiatry
